# Patient Record
Sex: Female | Born: 1945 | Race: White | Hispanic: No | Smoking: Never smoker
Health system: Southern US, Community
[De-identification: ages and names within clinical notes are randomized; demographics above are authoritative.]

## PROBLEM LIST (undated history)

## (undated) DIAGNOSIS — M1990 Unspecified osteoarthritis, unspecified site: Secondary | ICD-10-CM

## (undated) DIAGNOSIS — C801 Malignant (primary) neoplasm, unspecified: Secondary | ICD-10-CM

## (undated) DIAGNOSIS — T7840XA Allergy, unspecified, initial encounter: Secondary | ICD-10-CM

## (undated) DIAGNOSIS — M359 Systemic involvement of connective tissue, unspecified: Secondary | ICD-10-CM

## (undated) DIAGNOSIS — H269 Unspecified cataract: Secondary | ICD-10-CM

## (undated) DIAGNOSIS — K828 Other specified diseases of gallbladder: Secondary | ICD-10-CM

## (undated) DIAGNOSIS — J45909 Unspecified asthma, uncomplicated: Secondary | ICD-10-CM

## (undated) DIAGNOSIS — Z5189 Encounter for other specified aftercare: Secondary | ICD-10-CM

## (undated) HISTORY — PX: DILATION AND CURETTAGE OF UTERUS: SHX78

## (undated) HISTORY — PX: ABDOMINAL HYSTERECTOMY: SHX81

## (undated) HISTORY — DX: Unspecified cataract: H26.9

## (undated) HISTORY — DX: Encounter for other specified aftercare: Z51.89

## (undated) HISTORY — DX: Malignant (primary) neoplasm, unspecified: C80.1

## (undated) HISTORY — DX: Allergy, unspecified, initial encounter: T78.40XA

## (undated) HISTORY — PX: TONSILLECTOMY: SUR1361

## (undated) HISTORY — PX: EYE SURGERY: SHX253

## (undated) HISTORY — DX: Unspecified osteoarthritis, unspecified site: M19.90

## (undated) HISTORY — DX: Unspecified asthma, uncomplicated: J45.909

---

## 1998-02-25 ENCOUNTER — Other Ambulatory Visit: Admission: RE | Admit: 1998-02-25 | Discharge: 1998-02-25 | Payer: Self-pay | Admitting: *Deleted

## 1999-04-06 ENCOUNTER — Ambulatory Visit (HOSPITAL_COMMUNITY): Admission: RE | Admit: 1999-04-06 | Discharge: 1999-04-06 | Payer: Self-pay | Admitting: Obstetrics and Gynecology

## 1999-04-06 ENCOUNTER — Encounter: Payer: Self-pay | Admitting: Obstetrics and Gynecology

## 2000-05-03 ENCOUNTER — Ambulatory Visit (HOSPITAL_COMMUNITY): Admission: RE | Admit: 2000-05-03 | Discharge: 2000-05-03 | Payer: Self-pay | Admitting: Obstetrics and Gynecology

## 2000-05-03 ENCOUNTER — Encounter: Payer: Self-pay | Admitting: Gynecology

## 2001-05-09 ENCOUNTER — Encounter: Payer: Self-pay | Admitting: Obstetrics and Gynecology

## 2001-05-09 ENCOUNTER — Ambulatory Visit (HOSPITAL_COMMUNITY): Admission: RE | Admit: 2001-05-09 | Discharge: 2001-05-09 | Payer: Self-pay | Admitting: Obstetrics and Gynecology

## 2002-05-12 ENCOUNTER — Encounter: Payer: Self-pay | Admitting: Gynecology

## 2002-05-12 ENCOUNTER — Ambulatory Visit (HOSPITAL_COMMUNITY): Admission: RE | Admit: 2002-05-12 | Discharge: 2002-05-12 | Payer: Self-pay | Admitting: Gynecology

## 2003-05-18 ENCOUNTER — Ambulatory Visit (HOSPITAL_COMMUNITY): Admission: RE | Admit: 2003-05-18 | Discharge: 2003-05-18 | Payer: Self-pay | Admitting: Obstetrics and Gynecology

## 2004-06-23 ENCOUNTER — Ambulatory Visit (HOSPITAL_COMMUNITY): Admission: RE | Admit: 2004-06-23 | Discharge: 2004-06-23 | Payer: Self-pay | Admitting: Obstetrics and Gynecology

## 2007-02-16 ENCOUNTER — Ambulatory Visit: Payer: Self-pay | Admitting: Cardiology

## 2007-02-25 ENCOUNTER — Ambulatory Visit: Payer: Self-pay | Admitting: Cardiology

## 2007-03-04 ENCOUNTER — Ambulatory Visit: Payer: Self-pay | Admitting: Cardiology

## 2009-11-01 ENCOUNTER — Encounter: Admission: RE | Admit: 2009-11-01 | Discharge: 2009-11-01 | Payer: Self-pay | Admitting: Neurosurgery

## 2010-06-10 ENCOUNTER — Encounter: Payer: Self-pay | Admitting: Obstetrics and Gynecology

## 2010-10-03 NOTE — Assessment & Plan Note (Signed)
[facility]                          [facility] OFFICE NOTE   NAME:Virginia Nash, Virginia Nash                     MRN:          [id number]  DATE:03/04/2007                            DOB:          1946/04/24    HISTORY OF PRESENT ILLNESS:  Patient is a 65 year old female with few  cardiac risk factors, who is admitted with atypical chest pain on  February 17, 2007.  The patient ruled out for myocardial infarction.  She underwent a Cardiolite stress study, which was within normal limits.  More importantly, the patient had transient left face and arm  paresthesias.  CT scan was negative for bleed.  She also had some  dysphagia, but she had a barium swallow in the past.   Patient states that since the hospital discharge, she has done well and  has had no recurrent symptoms.  She also had an echocardiographic study  done with saline contrast to rule out patent foramen ovale.  The study  was negative.   Carotid Dopplers were done.  There was no significant carotid disease  noted.  However, Dr. Vergia Alcon did mention the fact that there was less than  50% plaque bilaterally.  Whether this represents any degree of plaque is  not certain to me.  The patient since hospital discharge has been taking  aspirin.   She denies any chest pain, shortness of breath, orthopnea, PND,  palpitations, or syncope.   MEDICATIONS:  1. Calcium 600 mg p.o. b.i.d.  2. Aspirin 81 mg p.o. daily.   PHYSICAL EXAMINATION:  VITAL SIGNS:  Blood pressure is 127/77, heart  rate 67 beats per minute, weight is 186 pounds.  NECK:  Normal carotid upstrokes.  No carotid bruits.  LUNGS:  Clear breath sounds bilaterally.  HEART:  Regular rate and rhythm.  Normal S1 and S2.  No murmurs, gallops  or rubs.  ABDOMEN:  Soft, nontender.  No rebound or guarding.  Good bowel sounds.  EXTREMITIES:  No clubbing, cyanosis or edema.  NEURO:  Patient is alert and oriented, grossly nonfocal.   PROBLEMS:  1. Atypical chest pain.  Negative Cardiolite imaging study.  2. Transient left face/arm paresthesias.      a.     Negative 2D echo for patent foramen ovale.  3. Unremarkable carotid Dopplers.  4. Rule out intracranial event.  Negative CT scan.  Negative      dysphagia.  Status post evaluation of barium swallow.   PLAN:  1. The patient's left arm and facial numbness is still concerning.      She has had no recurrence of disease.  She has been placed on      aspirin.  I have asked the patient to go ahead and make an      appointment with Dr. Benson Setting.  We will also order an MRI in the      interim.  Further evaluation can be per the neurologist.  2. In regards to the patient's chest pain, this has resolved.      Cardiolite study was negative.  She also has essentially no cardiac  risk factors.  I do not think any further ischemia workup at this      point in time is needed.  3. Patient can follow up with [place] in six months.     Learta Codding, MD,FACC  Electronically Signed    GED/MedQ  DD: 03/04/2007  DT: 03/05/2007  Job #: [id number]   cc:   Doreen Beam

## 2011-03-01 DIAGNOSIS — N393 Stress incontinence (female) (male): Secondary | ICD-10-CM | POA: Insufficient documentation

## 2011-03-01 DIAGNOSIS — R32 Unspecified urinary incontinence: Secondary | ICD-10-CM | POA: Insufficient documentation

## 2013-06-30 DIAGNOSIS — J019 Acute sinusitis, unspecified: Secondary | ICD-10-CM | POA: Diagnosis not present

## 2013-10-20 DIAGNOSIS — Z1212 Encounter for screening for malignant neoplasm of rectum: Secondary | ICD-10-CM | POA: Diagnosis not present

## 2013-10-20 DIAGNOSIS — R5381 Other malaise: Secondary | ICD-10-CM | POA: Diagnosis not present

## 2013-10-20 DIAGNOSIS — Z0000 Encounter for general adult medical examination without abnormal findings: Secondary | ICD-10-CM | POA: Diagnosis not present

## 2013-10-20 DIAGNOSIS — E7800 Pure hypercholesterolemia, unspecified: Secondary | ICD-10-CM | POA: Diagnosis not present

## 2013-10-21 DIAGNOSIS — Z23 Encounter for immunization: Secondary | ICD-10-CM | POA: Diagnosis not present

## 2013-11-21 DIAGNOSIS — Z79899 Other long term (current) drug therapy: Secondary | ICD-10-CM | POA: Diagnosis not present

## 2013-11-21 DIAGNOSIS — S6990XA Unspecified injury of unspecified wrist, hand and finger(s), initial encounter: Secondary | ICD-10-CM | POA: Diagnosis not present

## 2013-11-21 DIAGNOSIS — M25549 Pain in joints of unspecified hand: Secondary | ICD-10-CM | POA: Diagnosis not present

## 2013-11-21 DIAGNOSIS — M79609 Pain in unspecified limb: Secondary | ICD-10-CM | POA: Diagnosis not present

## 2013-11-21 DIAGNOSIS — Z8542 Personal history of malignant neoplasm of other parts of uterus: Secondary | ICD-10-CM | POA: Diagnosis not present

## 2013-11-21 DIAGNOSIS — M19049 Primary osteoarthritis, unspecified hand: Secondary | ICD-10-CM | POA: Diagnosis not present

## 2014-01-14 DIAGNOSIS — Z1211 Encounter for screening for malignant neoplasm of colon: Secondary | ICD-10-CM | POA: Diagnosis not present

## 2014-01-19 DIAGNOSIS — Z1231 Encounter for screening mammogram for malignant neoplasm of breast: Secondary | ICD-10-CM | POA: Diagnosis not present

## 2014-02-11 DIAGNOSIS — Z23 Encounter for immunization: Secondary | ICD-10-CM | POA: Diagnosis not present

## 2014-03-23 DIAGNOSIS — C801 Malignant (primary) neoplasm, unspecified: Secondary | ICD-10-CM | POA: Diagnosis not present

## 2014-03-23 DIAGNOSIS — Z01411 Encounter for gynecological examination (general) (routine) with abnormal findings: Secondary | ICD-10-CM | POA: Diagnosis not present

## 2014-04-01 DIAGNOSIS — H2513 Age-related nuclear cataract, bilateral: Secondary | ICD-10-CM | POA: Diagnosis not present

## 2014-04-01 DIAGNOSIS — H2511 Age-related nuclear cataract, right eye: Secondary | ICD-10-CM | POA: Diagnosis not present

## 2014-04-01 DIAGNOSIS — H538 Other visual disturbances: Secondary | ICD-10-CM | POA: Diagnosis not present

## 2014-04-01 DIAGNOSIS — H25041 Posterior subcapsular polar age-related cataract, right eye: Secondary | ICD-10-CM | POA: Diagnosis not present

## 2014-04-01 DIAGNOSIS — H25042 Posterior subcapsular polar age-related cataract, left eye: Secondary | ICD-10-CM | POA: Diagnosis not present

## 2014-04-21 DIAGNOSIS — M542 Cervicalgia: Secondary | ICD-10-CM | POA: Diagnosis not present

## 2014-04-21 DIAGNOSIS — M2550 Pain in unspecified joint: Secondary | ICD-10-CM | POA: Diagnosis not present

## 2014-04-29 DIAGNOSIS — H2511 Age-related nuclear cataract, right eye: Secondary | ICD-10-CM | POA: Diagnosis not present

## 2014-04-29 DIAGNOSIS — H25041 Posterior subcapsular polar age-related cataract, right eye: Secondary | ICD-10-CM | POA: Diagnosis not present

## 2014-04-29 DIAGNOSIS — H538 Other visual disturbances: Secondary | ICD-10-CM | POA: Diagnosis not present

## 2014-05-03 DIAGNOSIS — Z7982 Long term (current) use of aspirin: Secondary | ICD-10-CM | POA: Diagnosis not present

## 2014-05-03 DIAGNOSIS — H5202 Hypermetropia, left eye: Secondary | ICD-10-CM | POA: Diagnosis not present

## 2014-05-03 DIAGNOSIS — H52209 Unspecified astigmatism, unspecified eye: Secondary | ICD-10-CM | POA: Diagnosis not present

## 2014-05-03 DIAGNOSIS — M2550 Pain in unspecified joint: Secondary | ICD-10-CM | POA: Diagnosis not present

## 2014-05-03 DIAGNOSIS — H538 Other visual disturbances: Secondary | ICD-10-CM | POA: Diagnosis not present

## 2014-05-03 DIAGNOSIS — H2511 Age-related nuclear cataract, right eye: Secondary | ICD-10-CM | POA: Diagnosis not present

## 2014-05-03 DIAGNOSIS — H25043 Posterior subcapsular polar age-related cataract, bilateral: Secondary | ICD-10-CM | POA: Diagnosis not present

## 2014-05-03 DIAGNOSIS — R0602 Shortness of breath: Secondary | ICD-10-CM | POA: Diagnosis not present

## 2014-05-03 DIAGNOSIS — R05 Cough: Secondary | ICD-10-CM | POA: Diagnosis not present

## 2014-05-03 DIAGNOSIS — H1132 Conjunctival hemorrhage, left eye: Secondary | ICD-10-CM | POA: Diagnosis not present

## 2014-05-03 DIAGNOSIS — Z79899 Other long term (current) drug therapy: Secondary | ICD-10-CM | POA: Diagnosis not present

## 2014-05-03 DIAGNOSIS — H524 Presbyopia: Secondary | ICD-10-CM | POA: Diagnosis not present

## 2014-05-03 DIAGNOSIS — H269 Unspecified cataract: Secondary | ICD-10-CM | POA: Diagnosis not present

## 2014-05-03 DIAGNOSIS — J45909 Unspecified asthma, uncomplicated: Secondary | ICD-10-CM | POA: Diagnosis not present

## 2014-05-03 DIAGNOSIS — R32 Unspecified urinary incontinence: Secondary | ICD-10-CM | POA: Diagnosis not present

## 2014-05-03 DIAGNOSIS — H25041 Posterior subcapsular polar age-related cataract, right eye: Secondary | ICD-10-CM | POA: Diagnosis not present

## 2014-05-31 DIAGNOSIS — H52209 Unspecified astigmatism, unspecified eye: Secondary | ICD-10-CM | POA: Diagnosis not present

## 2014-05-31 DIAGNOSIS — J45909 Unspecified asthma, uncomplicated: Secondary | ICD-10-CM | POA: Diagnosis not present

## 2014-05-31 DIAGNOSIS — Z91010 Allergy to peanuts: Secondary | ICD-10-CM | POA: Diagnosis not present

## 2014-05-31 DIAGNOSIS — Z91018 Allergy to other foods: Secondary | ICD-10-CM | POA: Diagnosis not present

## 2014-05-31 DIAGNOSIS — H538 Other visual disturbances: Secondary | ICD-10-CM | POA: Diagnosis not present

## 2014-05-31 DIAGNOSIS — H2512 Age-related nuclear cataract, left eye: Secondary | ICD-10-CM | POA: Diagnosis not present

## 2014-05-31 DIAGNOSIS — Z961 Presence of intraocular lens: Secondary | ICD-10-CM | POA: Diagnosis not present

## 2014-05-31 DIAGNOSIS — H25042 Posterior subcapsular polar age-related cataract, left eye: Secondary | ICD-10-CM | POA: Diagnosis not present

## 2014-05-31 DIAGNOSIS — H524 Presbyopia: Secondary | ICD-10-CM | POA: Diagnosis not present

## 2014-05-31 DIAGNOSIS — H5200 Hypermetropia, unspecified eye: Secondary | ICD-10-CM | POA: Diagnosis not present

## 2014-05-31 DIAGNOSIS — Z7982 Long term (current) use of aspirin: Secondary | ICD-10-CM | POA: Diagnosis not present

## 2014-05-31 DIAGNOSIS — Z79899 Other long term (current) drug therapy: Secondary | ICD-10-CM | POA: Diagnosis not present

## 2014-05-31 DIAGNOSIS — Z8673 Personal history of transient ischemic attack (TIA), and cerebral infarction without residual deficits: Secondary | ICD-10-CM | POA: Diagnosis not present

## 2014-05-31 DIAGNOSIS — H1132 Conjunctival hemorrhage, left eye: Secondary | ICD-10-CM | POA: Diagnosis not present

## 2014-05-31 DIAGNOSIS — H269 Unspecified cataract: Secondary | ICD-10-CM | POA: Diagnosis not present

## 2014-07-21 DIAGNOSIS — M79602 Pain in left arm: Secondary | ICD-10-CM | POA: Diagnosis not present

## 2014-07-21 DIAGNOSIS — R5383 Other fatigue: Secondary | ICD-10-CM | POA: Diagnosis not present

## 2014-08-05 DIAGNOSIS — R5383 Other fatigue: Secondary | ICD-10-CM | POA: Diagnosis not present

## 2014-10-27 DIAGNOSIS — M25512 Pain in left shoulder: Secondary | ICD-10-CM | POA: Diagnosis not present

## 2014-10-27 DIAGNOSIS — M19012 Primary osteoarthritis, left shoulder: Secondary | ICD-10-CM | POA: Diagnosis not present

## 2014-10-27 DIAGNOSIS — Z0000 Encounter for general adult medical examination without abnormal findings: Secondary | ICD-10-CM | POA: Diagnosis not present

## 2014-12-27 DIAGNOSIS — M1711 Unilateral primary osteoarthritis, right knee: Secondary | ICD-10-CM | POA: Diagnosis not present

## 2014-12-27 DIAGNOSIS — M79604 Pain in right leg: Secondary | ICD-10-CM | POA: Diagnosis not present

## 2014-12-27 DIAGNOSIS — M1712 Unilateral primary osteoarthritis, left knee: Secondary | ICD-10-CM | POA: Diagnosis not present

## 2014-12-30 DIAGNOSIS — N1330 Unspecified hydronephrosis: Secondary | ICD-10-CM | POA: Diagnosis not present

## 2014-12-30 DIAGNOSIS — R079 Chest pain, unspecified: Secondary | ICD-10-CM | POA: Diagnosis not present

## 2014-12-30 DIAGNOSIS — K829 Disease of gallbladder, unspecified: Secondary | ICD-10-CM | POA: Diagnosis not present

## 2014-12-30 DIAGNOSIS — Z91018 Allergy to other foods: Secondary | ICD-10-CM | POA: Diagnosis not present

## 2014-12-30 DIAGNOSIS — J984 Other disorders of lung: Secondary | ICD-10-CM | POA: Diagnosis not present

## 2014-12-30 DIAGNOSIS — R0789 Other chest pain: Secondary | ICD-10-CM | POA: Diagnosis not present

## 2014-12-31 DIAGNOSIS — R52 Pain, unspecified: Secondary | ICD-10-CM | POA: Diagnosis not present

## 2014-12-31 DIAGNOSIS — R3915 Urgency of urination: Secondary | ICD-10-CM | POA: Diagnosis not present

## 2014-12-31 DIAGNOSIS — R079 Chest pain, unspecified: Secondary | ICD-10-CM | POA: Diagnosis not present

## 2014-12-31 DIAGNOSIS — K828 Other specified diseases of gallbladder: Secondary | ICD-10-CM | POA: Diagnosis not present

## 2014-12-31 DIAGNOSIS — N1330 Unspecified hydronephrosis: Secondary | ICD-10-CM | POA: Diagnosis not present

## 2015-01-03 DIAGNOSIS — K828 Other specified diseases of gallbladder: Secondary | ICD-10-CM | POA: Diagnosis not present

## 2015-01-03 DIAGNOSIS — R319 Hematuria, unspecified: Secondary | ICD-10-CM | POA: Diagnosis not present

## 2015-01-10 DIAGNOSIS — M25561 Pain in right knee: Secondary | ICD-10-CM | POA: Diagnosis not present

## 2015-01-10 DIAGNOSIS — R934 Abnormal findings on diagnostic imaging of urinary organs: Secondary | ICD-10-CM | POA: Diagnosis not present

## 2015-01-10 DIAGNOSIS — R3915 Urgency of urination: Secondary | ICD-10-CM | POA: Diagnosis not present

## 2015-01-10 DIAGNOSIS — N393 Stress incontinence (female) (male): Secondary | ICD-10-CM | POA: Diagnosis not present

## 2015-01-10 DIAGNOSIS — R3914 Feeling of incomplete bladder emptying: Secondary | ICD-10-CM | POA: Diagnosis not present

## 2015-01-17 ENCOUNTER — Ambulatory Visit: Payer: Medicare Other | Admitting: Orthopedic Surgery

## 2015-01-18 DIAGNOSIS — N2889 Other specified disorders of kidney and ureter: Secondary | ICD-10-CM | POA: Diagnosis not present

## 2015-01-18 DIAGNOSIS — R934 Abnormal findings on diagnostic imaging of urinary organs: Secondary | ICD-10-CM | POA: Diagnosis not present

## 2015-01-26 DIAGNOSIS — Z961 Presence of intraocular lens: Secondary | ICD-10-CM | POA: Diagnosis not present

## 2015-01-31 DIAGNOSIS — R3915 Urgency of urination: Secondary | ICD-10-CM | POA: Diagnosis not present

## 2015-01-31 DIAGNOSIS — R934 Abnormal findings on diagnostic imaging of urinary organs: Secondary | ICD-10-CM | POA: Diagnosis not present

## 2015-01-31 DIAGNOSIS — R3914 Feeling of incomplete bladder emptying: Secondary | ICD-10-CM | POA: Diagnosis not present

## 2015-02-15 DIAGNOSIS — Z23 Encounter for immunization: Secondary | ICD-10-CM | POA: Diagnosis not present

## 2015-02-21 DIAGNOSIS — M1711 Unilateral primary osteoarthritis, right knee: Secondary | ICD-10-CM | POA: Diagnosis not present

## 2015-02-24 ENCOUNTER — Ambulatory Visit (INDEPENDENT_AMBULATORY_CARE_PROVIDER_SITE_OTHER): Payer: Medicare Other | Admitting: Pediatrics

## 2015-02-24 ENCOUNTER — Encounter: Payer: Self-pay | Admitting: Pediatrics

## 2015-02-24 VITALS — BP 127/79 | HR 73 | Temp 97.3°F | Ht 61.0 in | Wt 195.6 lb

## 2015-02-24 DIAGNOSIS — R1011 Right upper quadrant pain: Secondary | ICD-10-CM | POA: Diagnosis not present

## 2015-02-24 DIAGNOSIS — Z8249 Family history of ischemic heart disease and other diseases of the circulatory system: Secondary | ICD-10-CM | POA: Diagnosis not present

## 2015-02-24 DIAGNOSIS — R932 Abnormal findings on diagnostic imaging of liver and biliary tract: Secondary | ICD-10-CM

## 2015-02-24 DIAGNOSIS — Z13220 Encounter for screening for lipoid disorders: Secondary | ICD-10-CM | POA: Diagnosis not present

## 2015-02-24 DIAGNOSIS — Z6836 Body mass index (BMI) 36.0-36.9, adult: Secondary | ICD-10-CM

## 2015-02-24 DIAGNOSIS — R079 Chest pain, unspecified: Secondary | ICD-10-CM | POA: Diagnosis not present

## 2015-02-24 DIAGNOSIS — E669 Obesity, unspecified: Secondary | ICD-10-CM | POA: Insufficient documentation

## 2015-02-24 DIAGNOSIS — M179 Osteoarthritis of knee, unspecified: Secondary | ICD-10-CM | POA: Diagnosis not present

## 2015-02-24 DIAGNOSIS — M1990 Unspecified osteoarthritis, unspecified site: Secondary | ICD-10-CM | POA: Insufficient documentation

## 2015-02-24 DIAGNOSIS — M1711 Unilateral primary osteoarthritis, right knee: Secondary | ICD-10-CM

## 2015-02-24 NOTE — Progress Notes (Signed)
Subjective:    Patient ID: Virginia Nash, female    DOB: 06/04/1945, 69 y.o.   MRN: [id number]  CC: wants to establish care  HPI: Virginia Nash is a 69 y.o. female presenting on 02/24/2015 for follow up multiple med problems. Gallbladder problems a month ago in the hospital.  Gallbladder was not removed, now when she eats often will have loose stools, was told she has 2% gall bladder function. Has lost a couple lbs in the last  No history of heart problems. Has had a couple of chest pain work ups. Walking around house, walking around shopping, no SOB. Arthritis, pain in her R knee.  Urinary incontinence, on myrbetriq, sees urology. Takes aspirin for primary prevention.  Relevant past medical, surgical, family and social history reviewed. Medical history reviewed. Allergies and medications reviewed.  ROS: All systems neg other than what is in HPI  Past Medical History There are no active problems to display for this patient.   Current Outpatient Prescriptions  Medication Sig Dispense Refill  . aspirin 81 MG tablet Take 81 mg by mouth daily.    . mirabegron ER (MYRBETRIQ) 25 MG TB24 tablet Take 25 mg by mouth daily.     No current facility-administered medications for this visit.       Objective:    BP 127/79 mmHg  Pulse 73  Temp(Src) 97.3 F (36.3 C) (Oral)  Ht 5' 1" (1.549 m)  Wt 195 lb 9.6 oz (88.724 kg)  BMI 36.98 kg/m2  Wt Readings from Last 3 Encounters:  02/24/15 195 lb 9.6 oz (88.724 kg)     Gen: NAD, alert, cooperative with exam, NCAT EYES: EOMI, no scleral injection or icterus ENT:  TMs pearly gray b/l, OP without erythema LYMPH: no cervical LAD Neck: normal thyroid CV: NRRR, normal S1/S2, no murmur Resp: CTABL, no wheezes, normal WOB Abd: +BS, soft, NTND. no guarding or organomegaly, no RUQ pain. Ext: No edema, warm Neuro: Alert and oriented, strength equal b/l UE and LE, coordination grossly normal     Assessment & Plan:     Nancyjo was  seen today to establish care and for follow up of multiple med problems.  Diagnoses and all orders for this visit:  Right upper quadrant pain, abnormal gall bladder imaging improved since ED visit but continues to have some dumping. Pt says she does not have gall stones but does have poor function of her glal bladder. She has follow up with her GI doctor later this month. She is avoiding foods that make the dumping worse. Rec avoiding fatty foods. -     CMP14+EGFR  Screening for hyperlipidemia, she has had chest pain in the past, was told it was not her heart. Will send cholesterol panel. No recent chest pains. -     Lipid panel  BMI 36 Discussed walking daily as she is able to, movement in R knee is limited by OA pain. Discussed healthy choices.  OA takes naproxen for R knee pain, helps.   Follow up plan: Return in about 5 months (around 07/25/2015).  Carol Vincent, MD [facility] 02/24/2015, 9:37 AM   

## 2015-02-25 LAB — CMP14+EGFR
A/G RATIO: 1.5 (ref 1.1–2.5)
ALT: 17 IU/L (ref 0–32)
AST: 16 IU/L (ref 0–40)
Albumin: 4.1 g/dL (ref 3.6–4.8)
Alkaline Phosphatase: 118 IU/L — ABNORMAL HIGH (ref 39–117)
BUN/Creatinine Ratio: 23 (ref 11–26)
BUN: 16 mg/dL (ref 8–27)
Bilirubin Total: 0.6 mg/dL (ref 0.0–1.2)
CALCIUM: 9.2 mg/dL (ref 8.7–10.3)
CO2: 24 mmol/L (ref 18–29)
CREATININE: 0.69 mg/dL (ref 0.57–1.00)
Chloride: 101 mmol/L (ref 97–108)
GFR, EST AFRICAN AMERICAN: 104 mL/min/{1.73_m2} (ref >59)
GFR, EST NON AFRICAN AMERICAN: 90 mL/min/{1.73_m2} (ref >59)
Globulin, Total: 2.7 g/dL (ref 1.5–4.5)
Glucose: 104 mg/dL — ABNORMAL HIGH (ref 65–99)
POTASSIUM: 4 mmol/L (ref 3.5–5.2)
Sodium: 141 mmol/L (ref 134–144)
TOTAL PROTEIN: 6.8 g/dL (ref 6.0–8.5)

## 2015-02-25 LAB — LIPID PANEL
CHOL/HDL RATIO: 4 ratio (ref 0.0–4.4)
Cholesterol, Total: 182 mg/dL (ref 100–199)
HDL: 46 mg/dL (ref >39)
LDL Calculated: 109 mg/dL — ABNORMAL HIGH (ref 0–99)
TRIGLYCERIDES: 136 mg/dL (ref 0–149)
VLDL CHOLESTEROL CAL: 27 mg/dL (ref 5–40)

## 2015-03-03 DIAGNOSIS — R3915 Urgency of urination: Secondary | ICD-10-CM | POA: Diagnosis not present

## 2015-03-03 DIAGNOSIS — R3914 Feeling of incomplete bladder emptying: Secondary | ICD-10-CM | POA: Diagnosis not present

## 2015-03-04 ENCOUNTER — Telehealth: Payer: Self-pay | Admitting: Pediatrics

## 2015-03-04 NOTE — Telephone Encounter (Signed)
Stp and advised we don't have access to any results or imaging from [place], pt states she will try to get the reports and get them to [place].

## 2015-03-16 ENCOUNTER — Telehealth: Payer: Self-pay | Admitting: Pediatrics

## 2015-03-16 DIAGNOSIS — K828 Other specified diseases of gallbladder: Secondary | ICD-10-CM

## 2015-03-16 DIAGNOSIS — N1339 Other hydronephrosis: Secondary | ICD-10-CM

## 2015-03-16 NOTE — Telephone Encounter (Signed)
Message routed to Dr. Evette Doffing to review records and referral regarding gallbladder

## 2015-03-17 NOTE — Telephone Encounter (Signed)
Pt admitted to [facility] 12/2014 for chest pain rule out, found to have non-functioning gallbladder, now with intermittent RUQ abd pain and R sided hydronephrosis with high-attentuation material in R renal collecting system as well as urinary bladder thought to be blood. Will refer to urology and surgery.

## 2015-03-17 NOTE — Addendum Note (Signed)
Addended by: Eustaquio Maize on: 03/17/2015 02:06 PM   Modules accepted: Orders

## 2015-03-18 ENCOUNTER — Encounter: Payer: Self-pay | Admitting: *Deleted

## 2015-03-21 ENCOUNTER — Telehealth: Payer: Self-pay | Admitting: Pediatrics

## 2015-03-21 NOTE — Telephone Encounter (Signed)
Pt notified paperwork and disc is at the front desk for pick up Verbalizes understanding

## 2015-03-29 ENCOUNTER — Ambulatory Visit: Payer: Self-pay | Admitting: General Surgery

## 2015-03-29 DIAGNOSIS — K828 Other specified diseases of gallbladder: Secondary | ICD-10-CM | POA: Diagnosis not present

## 2015-03-31 ENCOUNTER — Telehealth: Payer: Self-pay | Admitting: Pediatrics

## 2015-04-05 ENCOUNTER — Encounter (HOSPITAL_BASED_OUTPATIENT_CLINIC_OR_DEPARTMENT_OTHER): Payer: Self-pay | Admitting: *Deleted

## 2015-04-07 ENCOUNTER — Other Ambulatory Visit: Payer: Self-pay

## 2015-04-07 ENCOUNTER — Encounter (HOSPITAL_COMMUNITY)
Admission: RE | Admit: 2015-04-07 | Discharge: 2015-04-07 | Disposition: A | Discharge status: Home / Self Care | Payer: Medicare Other | Source: Ambulatory Visit | Attending: General Surgery | Admitting: General Surgery

## 2015-04-07 ENCOUNTER — Ambulatory Visit (HOSPITAL_COMMUNITY)
Admission: RE | Admit: 2015-04-07 | Discharge: 2015-04-07 | Disposition: A | Discharge status: Home / Self Care | Payer: Medicare Other | Source: Ambulatory Visit | Attending: General Surgery | Admitting: General Surgery

## 2015-04-07 DIAGNOSIS — Z01818 Encounter for other preprocedural examination: Secondary | ICD-10-CM | POA: Insufficient documentation

## 2015-04-07 DIAGNOSIS — Z01812 Encounter for preprocedural laboratory examination: Secondary | ICD-10-CM | POA: Insufficient documentation

## 2015-04-07 DIAGNOSIS — Z01810 Encounter for preprocedural cardiovascular examination: Secondary | ICD-10-CM | POA: Diagnosis not present

## 2015-04-07 LAB — CBC WITH DIFFERENTIAL/PLATELET
Basophils Absolute: 0 10*3/uL (ref 0.0–0.1)
Basophils Relative: 1 %
EOS ABS: 0.2 10*3/uL (ref 0.0–0.7)
EOS PCT: 4 %
HCT: 42.4 % (ref 36.0–46.0)
Hemoglobin: 13.9 g/dL (ref 12.0–15.0)
LYMPHS ABS: 2.2 10*3/uL (ref 0.7–4.0)
Lymphocytes Relative: 34 %
MCH: 31.4 pg (ref 26.0–34.0)
MCHC: 32.8 g/dL (ref 30.0–36.0)
MCV: 95.7 fL (ref 78.0–100.0)
MONOS PCT: 7 %
Monocytes Absolute: 0.5 10*3/uL (ref 0.1–1.0)
Neutro Abs: 3.5 10*3/uL (ref 1.7–7.7)
Neutrophils Relative %: 54 %
PLATELETS: 199 10*3/uL (ref 150–400)
RBC: 4.43 MIL/uL (ref 3.87–5.11)
RDW: 13.5 % (ref 11.5–15.5)
WBC: 6.5 10*3/uL (ref 4.0–10.5)

## 2015-04-07 LAB — COMPREHENSIVE METABOLIC PANEL
ALT: 16 U/L (ref 14–54)
ANION GAP: 5 (ref 5–15)
AST: 19 U/L (ref 15–41)
Albumin: 4 g/dL (ref 3.5–5.0)
Alkaline Phosphatase: 107 U/L (ref 38–126)
BUN: 18 mg/dL (ref 6–20)
CHLORIDE: 106 mmol/L (ref 101–111)
CO2: 27 mmol/L (ref 22–32)
Calcium: 8.9 mg/dL (ref 8.9–10.3)
Creatinine, Ser: 0.76 mg/dL (ref 0.44–1.00)
GFR calc non Af Amer: >60 mL/min (ref >60)
Glucose, Bld: 108 mg/dL — ABNORMAL HIGH (ref 65–99)
POTASSIUM: 4.7 mmol/L (ref 3.5–5.1)
SODIUM: 138 mmol/L (ref 135–145)
Total Bilirubin: 0.7 mg/dL (ref 0.3–1.2)
Total Protein: 7 g/dL (ref 6.5–8.1)

## 2015-04-08 DIAGNOSIS — Z1231 Encounter for screening mammogram for malignant neoplasm of breast: Secondary | ICD-10-CM | POA: Diagnosis not present

## 2015-04-08 LAB — HM MAMMOGRAPHY: HM MAMMO: NEGATIVE

## 2015-04-12 ENCOUNTER — Ambulatory Visit (HOSPITAL_BASED_OUTPATIENT_CLINIC_OR_DEPARTMENT_OTHER)
Admission: RE | Admit: 2015-04-12 | Discharge: 2015-04-12 | Disposition: A | Discharge status: Home / Self Care | Payer: Medicare Other | Source: Ambulatory Visit | Attending: General Surgery | Admitting: General Surgery

## 2015-04-12 ENCOUNTER — Ambulatory Visit (HOSPITAL_BASED_OUTPATIENT_CLINIC_OR_DEPARTMENT_OTHER): Payer: Medicare Other | Admitting: Anesthesiology

## 2015-04-12 ENCOUNTER — Encounter (HOSPITAL_BASED_OUTPATIENT_CLINIC_OR_DEPARTMENT_OTHER): Payer: Self-pay | Admitting: *Deleted

## 2015-04-12 ENCOUNTER — Encounter (HOSPITAL_BASED_OUTPATIENT_CLINIC_OR_DEPARTMENT_OTHER)
Admission: RE | Disposition: A | Discharge status: Home / Self Care | Payer: Self-pay | Source: Ambulatory Visit | Attending: General Surgery

## 2015-04-12 DIAGNOSIS — K811 Chronic cholecystitis: Secondary | ICD-10-CM | POA: Diagnosis not present

## 2015-04-12 DIAGNOSIS — Z6836 Body mass index (BMI) 36.0-36.9, adult: Secondary | ICD-10-CM | POA: Insufficient documentation

## 2015-04-12 DIAGNOSIS — K828 Other specified diseases of gallbladder: Secondary | ICD-10-CM | POA: Insufficient documentation

## 2015-04-12 DIAGNOSIS — M1990 Unspecified osteoarthritis, unspecified site: Secondary | ICD-10-CM | POA: Diagnosis not present

## 2015-04-12 DIAGNOSIS — Z7982 Long term (current) use of aspirin: Secondary | ICD-10-CM | POA: Diagnosis not present

## 2015-04-12 DIAGNOSIS — Z791 Long term (current) use of non-steroidal anti-inflammatories (NSAID): Secondary | ICD-10-CM | POA: Diagnosis not present

## 2015-04-12 HISTORY — DX: Other specified diseases of gallbladder: K82.8

## 2015-04-12 HISTORY — PX: CHOLECYSTECTOMY: SHX55

## 2015-04-12 SURGERY — LAPAROSCOPIC CHOLECYSTECTOMY
Anesthesia: General | Site: Abdomen

## 2015-04-12 MED ORDER — EPHEDRINE SULFATE 50 MG/ML IJ SOLN
INTRAMUSCULAR | Status: DC | PRN
  Administered 2015-04-12: 15 mg via INTRAVENOUS

## 2015-04-12 MED ORDER — FENTANYL CITRATE (PF) 100 MCG/2ML IJ SOLN
INTRAMUSCULAR | Status: AC
  Filled 2015-04-12: qty 2

## 2015-04-12 MED ORDER — NEOSTIGMINE METHYLSULFATE 10 MG/10ML IV SOLN
INTRAVENOUS | Status: DC | PRN
  Administered 2015-04-12: 4 mg via INTRAVENOUS

## 2015-04-12 MED ORDER — SCOPOLAMINE 1 MG/3DAYS TD PT72: 1.0000 | MEDICATED_PATCH | Freq: Once | TRANSDERMAL | Status: DC | PRN

## 2015-04-12 MED ORDER — MEPERIDINE HCL 25 MG/ML IJ SOLN: 6.2500 mg | INTRAMUSCULAR | Status: DC | PRN

## 2015-04-12 MED ORDER — NEOSTIGMINE METHYLSULFATE 10 MG/10ML IV SOLN
INTRAVENOUS | Status: AC
  Filled 2015-04-12: qty 1

## 2015-04-12 MED ORDER — DEXAMETHASONE SODIUM PHOSPHATE 4 MG/ML IJ SOLN
INTRAMUSCULAR | Status: DC | PRN
  Administered 2015-04-12: 10 mg via INTRAVENOUS

## 2015-04-12 MED ORDER — OXYCODONE HCL 5 MG/5ML PO SOLN: 5.0000 mg | Freq: Once | ORAL | Status: DC | PRN

## 2015-04-12 MED ORDER — BUPIVACAINE-EPINEPHRINE (PF) 0.5% -1:200000 IJ SOLN
INTRAMUSCULAR | Status: AC
  Filled 2015-04-12: qty 30

## 2015-04-12 MED ORDER — CEFAZOLIN SODIUM-DEXTROSE 2-3 GM-% IV SOLR
2.0000 g | INTRAVENOUS | Status: AC
  Administered 2015-04-12: 2 g via INTRAVENOUS

## 2015-04-12 MED ORDER — BUPIVACAINE-EPINEPHRINE (PF) 0.25% -1:200000 IJ SOLN
INTRAMUSCULAR | Status: AC
  Filled 2015-04-12: qty 30

## 2015-04-12 MED ORDER — TRAMADOL HCL 50 MG PO TABS
50.0000 mg | ORAL_TABLET | Freq: Once | ORAL | Status: AC
  Administered 2015-04-12: 50 mg via ORAL

## 2015-04-12 MED ORDER — ONDANSETRON HCL 4 MG/2ML IJ SOLN
INTRAMUSCULAR | Status: AC
Start: 2015-04-12 — End: 2015-04-12
  Filled 2015-04-12: qty 2

## 2015-04-12 MED ORDER — SODIUM CHLORIDE 0.9 % IR SOLN
Status: DC | PRN
  Administered 2015-04-12: 3000 mL

## 2015-04-12 MED ORDER — GLYCOPYRROLATE 0.2 MG/ML IJ SOLN
INTRAMUSCULAR | Status: AC
  Filled 2015-04-12: qty 1

## 2015-04-12 MED ORDER — TRAMADOL HCL 50 MG PO TABS
ORAL_TABLET | ORAL | Status: AC
  Filled 2015-04-12: qty 1

## 2015-04-12 MED ORDER — ARTIFICIAL TEARS OP OINT
TOPICAL_OINTMENT | OPHTHALMIC | Status: AC
  Filled 2015-04-12: qty 3.5

## 2015-04-12 MED ORDER — CHLORHEXIDINE GLUCONATE 4 % EX LIQD: 1.0000 "application " | Freq: Once | CUTANEOUS | Status: DC

## 2015-04-12 MED ORDER — PROPOFOL 10 MG/ML IV BOLUS
INTRAVENOUS | Status: AC
Start: 2015-04-12 — End: 2015-04-12
  Filled 2015-04-12: qty 20

## 2015-04-12 MED ORDER — MIDAZOLAM HCL 2 MG/2ML IJ SOLN
1.0000 mg | INTRAMUSCULAR | Status: DC | PRN
  Administered 2015-04-12: 2 mg via INTRAVENOUS

## 2015-04-12 MED ORDER — BUPIVACAINE-EPINEPHRINE 0.25% -1:200000 IJ SOLN
INTRAMUSCULAR | Status: DC | PRN
  Administered 2015-04-12: 20 mL

## 2015-04-12 MED ORDER — HYDROMORPHONE HCL 1 MG/ML IJ SOLN
INTRAMUSCULAR | Status: AC
  Filled 2015-04-12: qty 1

## 2015-04-12 MED ORDER — OXYCODONE HCL 5 MG PO TABS: 5.0000 mg | ORAL_TABLET | Freq: Once | ORAL | Status: DC | PRN

## 2015-04-12 MED ORDER — KETOROLAC TROMETHAMINE 30 MG/ML IJ SOLN
INTRAMUSCULAR | Status: AC
  Filled 2015-04-12: qty 1

## 2015-04-12 MED ORDER — HYDROCODONE-ACETAMINOPHEN 5-325 MG PO TABS: 1.0000 | ORAL_TABLET | ORAL | Status: DC | PRN

## 2015-04-12 MED ORDER — HYDROMORPHONE HCL 1 MG/ML IJ SOLN
0.2500 mg | INTRAMUSCULAR | Status: DC | PRN
Start: 2015-04-12 — End: 2015-04-12
  Administered 2015-04-12 (×2): 0.5 mg via INTRAVENOUS

## 2015-04-12 MED ORDER — MIDAZOLAM HCL 2 MG/2ML IJ SOLN
INTRAMUSCULAR | Status: AC
  Filled 2015-04-12: qty 2

## 2015-04-12 MED ORDER — ROCURONIUM BROMIDE 50 MG/5ML IV SOLN
INTRAVENOUS | Status: AC
  Filled 2015-04-12: qty 1

## 2015-04-12 MED ORDER — CEFAZOLIN SODIUM-DEXTROSE 2-3 GM-% IV SOLR
INTRAVENOUS | Status: AC
  Filled 2015-04-12: qty 50

## 2015-04-12 MED ORDER — ONDANSETRON HCL 4 MG/2ML IJ SOLN
INTRAMUSCULAR | Status: DC | PRN
  Administered 2015-04-12: 4 mg via INTRAVENOUS

## 2015-04-12 MED ORDER — LIDOCAINE HCL (CARDIAC) 20 MG/ML IV SOLN
INTRAVENOUS | Status: AC
  Filled 2015-04-12: qty 5

## 2015-04-12 MED ORDER — PROPOFOL 10 MG/ML IV BOLUS
INTRAVENOUS | Status: DC | PRN
  Administered 2015-04-12: 200 mg via INTRAVENOUS

## 2015-04-12 MED ORDER — ROCURONIUM BROMIDE 100 MG/10ML IV SOLN
INTRAVENOUS | Status: DC | PRN
  Administered 2015-04-12: 30 mg via INTRAVENOUS

## 2015-04-12 MED ORDER — EPHEDRINE SULFATE 50 MG/ML IJ SOLN
INTRAMUSCULAR | Status: AC
  Filled 2015-04-12: qty 1

## 2015-04-12 MED ORDER — FENTANYL CITRATE (PF) 100 MCG/2ML IJ SOLN
50.0000 ug | INTRAMUSCULAR | Status: DC | PRN
  Administered 2015-04-12: 100 ug via INTRAVENOUS

## 2015-04-12 MED ORDER — KETOROLAC TROMETHAMINE 30 MG/ML IJ SOLN
INTRAMUSCULAR | Status: DC | PRN
  Administered 2015-04-12: 30 mg via INTRAVENOUS

## 2015-04-12 MED ORDER — LIDOCAINE HCL (CARDIAC) 20 MG/ML IV SOLN
INTRAVENOUS | Status: DC | PRN
  Administered 2015-04-12: 75 mg via INTRAVENOUS

## 2015-04-12 MED ORDER — LACTATED RINGERS IV SOLN
INTRAVENOUS | Status: DC
  Administered 2015-04-12: 07:00:00 via INTRAVENOUS

## 2015-04-12 MED ORDER — GLYCOPYRROLATE 0.2 MG/ML IJ SOLN
0.2000 mg | Freq: Once | INTRAMUSCULAR | Status: AC | PRN
  Administered 2015-04-12: 0.6 mg via INTRAVENOUS

## 2015-04-12 MED ORDER — DEXAMETHASONE SODIUM PHOSPHATE 10 MG/ML IJ SOLN
INTRAMUSCULAR | Status: AC
  Filled 2015-04-12: qty 1

## 2015-04-12 SURGICAL SUPPLY — 51 items
ADH SKN CLS APL DERMABOND .7 (GAUZE/BANDAGES/DRESSINGS) ×2
APPLIER CLIP 5 13 M/L LIGAMAX5 (MISCELLANEOUS) ×3
APPLIER CLIP ROT 10 11.4 M/L (STAPLE)
APR CLP MED LRG 11.4X10 (STAPLE)
APR CLP MED LRG 5 ANG JAW (MISCELLANEOUS) ×2
BAG SPEC RTRVL LRG 6X4 10 (ENDOMECHANICALS)
BLADE CLIPPER SURG (BLADE) IMPLANT
CHLORAPREP W/TINT 26ML (MISCELLANEOUS) ×3 IMPLANT
CLEANER CAUTERY TIP 5X5 PAD (MISCELLANEOUS) ×1 IMPLANT
CLIP APPLIE 5 13 M/L LIGAMAX5 (MISCELLANEOUS) ×2 IMPLANT
CLIP APPLIE ROT 10 11.4 M/L (STAPLE) IMPLANT
COVER MAYO STAND STRL (DRAPES) ×3 IMPLANT
DECANTER SPIKE VIAL GLASS SM (MISCELLANEOUS) IMPLANT
DERMABOND ADVANCED (GAUZE/BANDAGES/DRESSINGS) ×1
DERMABOND ADVANCED .7 DNX12 (GAUZE/BANDAGES/DRESSINGS) ×1 IMPLANT
DRAPE C-ARM 42X72 X-RAY (DRAPES) ×1 IMPLANT
DRAPE LAPAROSCOPIC ABDOMINAL (DRAPES) ×3 IMPLANT
DRSG TEGADERM 2-3/8X2-3/4 SM (GAUZE/BANDAGES/DRESSINGS) ×12 IMPLANT
ELECT REM PT RETURN 9FT ADLT (ELECTROSURGICAL) ×3
ELECTRODE REM PT RTRN 9FT ADLT (ELECTROSURGICAL) ×2 IMPLANT
FILTER SMOKE EVAC LAPAROSHD (FILTER) IMPLANT
GLOVE BIOGEL PI IND STRL 7.0 (GLOVE) ×1 IMPLANT
GLOVE BIOGEL PI IND STRL 8 (GLOVE) ×3 IMPLANT
GLOVE BIOGEL PI INDICATOR 7.0 (GLOVE) ×1
GLOVE BIOGEL PI INDICATOR 8 (GLOVE) ×2
GLOVE ECLIPSE 6.5 STRL STRAW (GLOVE) ×3 IMPLANT
GLOVE ECLIPSE 7.5 STRL STRAW (GLOVE) ×3 IMPLANT
GLOVE SURG SS PI 7.5 STRL IVOR (GLOVE) ×2 IMPLANT
GOWN STRL REUS W/ TWL LRG LVL3 (GOWN DISPOSABLE) ×6 IMPLANT
GOWN STRL REUS W/TWL LRG LVL3 (GOWN DISPOSABLE) ×9
HEMOSTAT SNOW SURGICEL 2X4 (HEMOSTASIS) IMPLANT
LIQUID BAND (GAUZE/BANDAGES/DRESSINGS) IMPLANT
NS IRRIG 1000ML POUR BTL (IV SOLUTION) IMPLANT
PACK BASIN DAY SURGERY FS (CUSTOM PROCEDURE TRAY) ×3 IMPLANT
PAD CLEANER CAUTERY TIP 5X5 (MISCELLANEOUS)
POUCH SPECIMEN RETRIEVAL 10MM (ENDOMECHANICALS) ×1 IMPLANT
SCISSORS LAP 5X35 DISP (ENDOMECHANICALS) ×2 IMPLANT
SET CHOLANGIOGRAPH 5 50 .035 (SET/KITS/TRAYS/PACK) IMPLANT
SET IRRIG TUBING LAPAROSCOPIC (IRRIGATION / IRRIGATOR) ×3 IMPLANT
SLEEVE ENDOPATH XCEL 5M (ENDOMECHANICALS) ×6 IMPLANT
SLEEVE SCD COMPRESS KNEE MED (MISCELLANEOUS) ×3 IMPLANT
SPECIMEN JAR SMALL (MISCELLANEOUS) ×3 IMPLANT
STRIP CLOSURE SKIN 1/2X4 (GAUZE/BANDAGES/DRESSINGS) ×2 IMPLANT
SUT MNCRL AB 4-0 PS2 18 (SUTURE) ×3 IMPLANT
TOWEL OR 17X24 6PK STRL BLUE (TOWEL DISPOSABLE) ×3 IMPLANT
TRAY LAPAROSCOPIC (CUSTOM PROCEDURE TRAY) ×3 IMPLANT
TROCAR XCEL BLUNT TIP 100MML (ENDOMECHANICALS) ×3 IMPLANT
TROCAR XCEL NON-BLD 11X100MML (ENDOMECHANICALS) IMPLANT
TROCAR XCEL NON-BLD 5MMX100MML (ENDOMECHANICALS) ×3 IMPLANT
TUBE CONNECTING 20X1/4 (TUBING) IMPLANT
TUBING INSUFFLATION (TUBING) ×3 IMPLANT

## 2015-04-12 NOTE — H&P (Signed)
Virginia Nash is an 69 y.o. female.   Chief Complaint: Abdominal pain HPI: Workup demonstrated low ejection fraction of the gallbladder, and her symptoms are consistent with chronic gallbladder disease  Past Medical History  Diagnosis Date  . Allergy   . Arthritis   . Blood transfusion without reported diagnosis   . Cancer ([place])   . Cataract   . Asthma     as child  . Biliary dyskinesia     Past Surgical History  Procedure Laterality Date  . Abdominal hysterectomy    . Tonsillectomy    . Dilation and curettage of uterus    . Eye surgery      cataract with lens replacement    Family History  Problem Relation Age of Onset  . Arthritis Mother   . Cancer Mother   . Cancer Father   . Hearing loss Sister   . Stroke Maternal Grandmother   . Kidney disease Paternal Grandmother    Social History:  reports that she has never smoked. She does not have any smokeless tobacco history on file. She reports that she does not drink alcohol or use illicit drugs.  Allergies:  Allergies  Allergen Reactions  . Peanuts [Peanut Oil]     Medications Prior to Admission  Medication Sig Dispense Refill  . aspirin 81 MG tablet Take 81 mg by mouth daily.    . naproxen sodium (ANAPROX) 220 MG tablet Take 220 mg by mouth 2 (two) times daily with a meal.      No results found for this or any previous visit (from the past 48 hour(s)). No results found.  Review of Systems  Gastrointestinal: Positive for abdominal pain.  All other systems reviewed and are negative.   Blood pressure 135/64, pulse 60, temperature 97.7 F (36.5 C), temperature source Oral, resp. rate 18, height 5\' 1"  (1.549 m), weight 87.544 kg (193 lb), SpO2 97 %. Physical Exam  Constitutional: She is oriented to person, place, and time. She appears well-developed and well-nourished.  HENT:  Head: Normocephalic and atraumatic.  Eyes: Conjunctivae are normal. Pupils are equal, round, and reactive to light.  Neck: Normal  range of motion. Neck supple.  Cardiovascular: Normal rate and regular rhythm.   Respiratory: Effort normal and breath sounds normal.  GI: Soft. Bowel sounds are normal.  Musculoskeletal: Normal range of motion.  Neurological: She is alert and oriented to person, place, and time. She has normal reflexes.  Skin: Skin is warm and dry.  Psychiatric: She has a normal mood and affect. Her behavior is normal. Judgment and thought content normal.     Assessment/Plan Symptomatic biliary dyskinesia Lap cholecystectomy today.  Lysle Yero 04/12/2015, 7:28 AM

## 2015-04-12 NOTE — Transfer of Care (Signed)
Immediate Anesthesia Transfer of Care Note  Patient: Virginia Nash  Procedure(s) Performed: Procedure(s): LAPAROSCOPIC CHOLECYSTECTOMY (N/A)  Patient Location: PACU  Anesthesia Type:General  Level of Consciousness: awake and patient cooperative  Airway & Oxygen Therapy: Patient Spontanous Breathing and Patient connected to face mask oxygen  Post-op Assessment: Report given to RN and Post -op Vital signs reviewed and stable  Post vital signs: Reviewed and stable  Last Vitals:  Filed Vitals:   04/12/15 0615  BP: 135/64  Pulse: 60  Temp: 36.5 C  Resp: 18    Complications: No apparent anesthesia complications

## 2015-04-12 NOTE — Anesthesia Procedure Notes (Signed)
Procedure Name: Intubation Performed by: Tisheena Maguire, [place] Pre-anesthesia Checklist: Patient identified, Emergency Drugs available, Suction available and Patient being monitored Patient Re-evaluated:Patient Re-evaluated prior to inductionOxygen Delivery Method: Circle System Utilized Preoxygenation: Pre-oxygenation with 100% oxygen Intubation Type: IV induction Ventilation: Mask ventilation without difficulty Laryngoscope Size: Mac and 3 Grade View: Grade II Tube type: Oral Tube size: 7.0 mm Number of attempts: 1 Airway Equipment and Method: Stylet and Oral airway Placement Confirmation: ETT inserted through vocal cords under direct vision,  positive ETCO2 and breath sounds checked- equal and bilateral Tube secured with: Tape Dental Injury: Teeth and Oropharynx as per pre-operative assessment      

## 2015-04-12 NOTE — Anesthesia Preprocedure Evaluation (Signed)
Anesthesia Evaluation  Patient identified by MRN, date of birth, ID band Patient awake    Reviewed: Allergy & Precautions, NPO status , Patient's Chart, lab work & pertinent test results  Airway Mallampati: I  TM Distance: >3 FB Neck ROM: Full    Dental  (+) Teeth Intact, Dental Advisory Given   Pulmonary    breath sounds clear to auscultation       Cardiovascular  Rhythm:Regular Rate:Normal     Neuro/Psych    GI/Hepatic   Endo/Other  Morbid obesity  Renal/GU      Musculoskeletal   Abdominal   Peds  Hematology   Anesthesia Other Findings   Reproductive/Obstetrics                             Anesthesia Physical Anesthesia Plan  ASA: II  Anesthesia Plan: General   Post-op Pain Management:    Induction: Intravenous  Airway Management Planned: Oral ETT  Additional Equipment:   Intra-op Plan:   Post-operative Plan: Extubation in OR  Informed Consent: I have reviewed the patients History and Physical, chart, labs and discussed the procedure including the risks, benefits and alternatives for the proposed anesthesia with the patient or authorized representative who has indicated his/her understanding and acceptance.   Dental advisory given  Plan Discussed with: CRNA, Anesthesiologist and Surgeon  Anesthesia Plan Comments:         Anesthesia Quick Evaluation  

## 2015-04-12 NOTE — Interval H&P Note (Signed)
History and Physical Interval Note:   Office notes:  Virginia Nash 03/29/2015 9:15 AM Location: [facility] Patient #: [id number] DOB: August 10, 1945 Married / Language: Virginia Nash / Race: White Female   History of Present Illness Virginia Nash. Virginia Skains MD; 03/29/2015 9:58 AM) The patient is a 69 year old female.  Note:Epigastric and lwoer chest pain triggering workup and ultimately HIDA scan showing GB EF of 2%  Other Problems Virginia Nash, CMA; 03/29/2015 9:15 AM) Arthritis Bladder Problems Cancer  Past Surgical History Virginia Nash, CMA; 03/29/2015 9:15 AM) Cataract Surgery Bilateral. Hysterectomy (due to cancer) - Complete Oral Surgery Tonsillectomy  Diagnostic Studies History Virginia Nash, CMA; 03/29/2015 9:15 AM) Colonoscopy 5-10 years ago Mammogram within last year Pap Smear 1-5 years ago  Allergies Virginia Nash, CMA; 03/29/2015 9:15 AM) Peanuts  Medication History Virginia Nash, CMA; 03/29/2015 9:16 AM) Aspirin (81MG  Tablet, Oral) Active. Medications Reconciled  Social History Virginia Nash, [place]; 03/29/2015 9:15 AM) Alcohol use Occasional alcohol use. Caffeine use Tea. No drug use Tobacco use Never smoker.  Family History Virginia Nash, [place]; 03/29/2015 9:15 AM) Arthritis Mother. Migraine Headache Daughter. Prostate Cancer Father. Thyroid problems Mother.  Pregnancy / Birth History Virginia Nash, [place]; 03/29/2015 9:15 AM) Age at menarche 36 years. Age of menopause 51-55 Contraceptive History Oral contraceptives. Gravida 3 Irregular periods Maternal age 32-25 Para 3    Review of Systems Virginia Nash CMA; 03/29/2015 9:15 AM) General Not Present- Appetite Loss, Chills, Fatigue, Fever, Night Sweats, Weight Gain and Weight Loss. Skin Not Present- Change in Wart/Mole, Dryness, Hives, Jaundice, New Lesions, Non-Healing Wounds, Rash and Ulcer. HEENT Present- Wears glasses/contact lenses. Not Present- Earache, Hearing Loss, Hoarseness, Nose  Bleed, Oral Ulcers, Ringing in the Ears, Seasonal Allergies, Sinus Pain, Sore Throat, Visual Disturbances and Yellow Eyes. Respiratory Present- Difficulty Breathing. Not Present- Bloody sputum, Chronic Cough, Snoring and Wheezing. Breast Not Present- Breast Mass, Breast Pain, Nipple Discharge and Skin Changes. Cardiovascular Present- Chest Pain, Leg Cramps and Swelling of Extremities. Not Present- Difficulty Breathing Lying Down, Palpitations, Rapid Heart Rate and Shortness of Breath. Gastrointestinal Present- Difficulty Swallowing and Nausea. Not Present- Abdominal Pain, Bloating, Bloody Stool, Change in Bowel Habits, Chronic diarrhea, Constipation, Excessive gas, Gets full quickly at meals, Hemorrhoids, Indigestion, Rectal Pain and Vomiting. Female Genitourinary Present- Frequency and Urgency. Not Present- Nocturia, Painful Urination and Pelvic Pain. Musculoskeletal Present- Joint Pain, Joint Stiffness and Swelling of Extremities. Not Present- Back Pain, Muscle Pain and Muscle Weakness. Neurological Present- Headaches, Tingling and Trouble walking. Not Present- Decreased Memory, Fainting, Numbness, Seizures, Tremor and Weakness. Psychiatric Not Present- Anxiety, Bipolar, Change in Sleep Pattern, Depression, Fearful and Frequent crying. Endocrine Not Present- Cold Intolerance, Excessive Hunger, Hair Changes, Heat Intolerance, Hot flashes and New Diabetes. Hematology Not Present- Easy Bruising, Excessive bleeding, Gland problems, HIV and Persistent Infections.  Vitals Virginia Nash CMA; 03/29/2015 9:16 AM) 03/29/2015 9:16 AM Weight: 194.8 lb Height: 61in Body Surface Area: 1.87 m Body Mass Index: 36.81 kg/m  Temp.: 97.25F(Temporal)  Pulse: 94 (Regular)  BP: 128/76 (Sitting, Left Arm, Standard)       Physical Exam (Virginia Nash O. Virginia Skains MD; 03/29/2015 10:01 AM) General Mental Status-Alert. General Appearance-Well groomed and Consistent with stated age. Orientation-Oriented  X4. Build & Nutrition-Obese. Posture-Normal posture.  Head and Neck Thyroid -Note: Normal without masses. Chest and Lung Exam Chest and lung exam reveals -quiet, even and easy respiratory effort with no use of accessory muscles and on auscultation, normal breath sounds, no adventitious sounds and normal vocal resonance. Cardiovascular Cardiovascular examination  reveals -on palpation PMI is normal in location and amplitude, no palpable S3 or S4. Normal cardiac borders., normal heart sounds, regular rate and rhythm with no murmurs and femoral artery auscultation bilaterally reveals normal pulses, no bruits, no thrills. Abdomen Inspection Inspection of the abdomen reveals - No Visible peristalsis and No Abnormal pulsations. Palpation/Percussion Palpation and Percussion of the abdomen reveal - Soft and Non Tender. Auscultation Auscultation of the abdomen reveals - Bowel sounds normal. Assessment & Plan Virginia Nash O. Demitrus Francisco MD; 03/29/2015 10:02 AM) BILIARY DYSKINESIA (K82.8) Story: Patient admitted at [facility] with chest pain, workup showed biliary dyskinesia. Otherwise the W/u was normal Impression: Symptomatic biliary dyskinesia. Postprandial. Fried foods exacerbate symptoms. Current Plans Pt Education - Gallbladder Diseases: discussed with patient and provided information. 04/12/2015 7:46 AM  Virginia Nash  has presented today for surgery, with the diagnosis of Symptomatic biliary dyskinesia  The various methods of treatment have been discussed with the patient and family. After consideration of risks, benefits and other options for treatment, the patient has consented to  Procedure(s): LAPAROSCOPIC CHOLECYSTECTOMY WITH INTRAOPERATIVE CHOLANGIOGRAM (N/A) as a surgical intervention .  The patient's history has been reviewed, patient examined, no change in status, stable for surgery.  I have reviewed the patient's chart and labs.  Questions were answered to the patient's  satisfaction.     Janei Scheff

## 2015-04-12 NOTE — Anesthesia Postprocedure Evaluation (Signed)
Anesthesia Post Note  Patient: Virginia Nash  Procedure(s) Performed: Procedure(s) (LRB): LAPAROSCOPIC CHOLECYSTECTOMY (N/A)  Patient location during evaluation: PACU Anesthesia Type: General Level of consciousness: awake and alert Pain management: pain level controlled Vital Signs Assessment: post-procedure vital signs reviewed and stable Respiratory status: spontaneous breathing, nonlabored ventilation, respiratory function stable and patient connected to nasal cannula oxygen Cardiovascular status: blood pressure returned to baseline and stable Postop Assessment: No signs of nausea or vomiting Anesthetic complications: no    Last Vitals:  Filed Vitals:   04/12/15 0851 04/12/15 0900  BP:  153/81  Pulse: 89 79  Temp: 36.4 C   Resp: 25 20    Last Pain:  Filed Vitals:   04/12/15 0912  PainSc: 4                  [name] A

## 2015-04-12 NOTE — Discharge Instructions (Addendum)
Laparoscopic Cholecystectomy, Care After Refer to this sheet in the next few weeks. These instructions provide you with information about caring for yourself after your procedure. Your health care provider may also give you more specific instructions. Your treatment has been planned according to current medical practices, but problems sometimes occur. Call your health care provider if you have any problems or questions after your procedure. WHAT TO EXPECT AFTER THE PROCEDURE After your procedure, it is common to have:  Pain at your incision sites. You will be given pain medicines to control your pain.  Mild nausea or vomiting. This should improve after the first 24 hours.  Bloating and possible shoulder pain from the gas that was used during the procedure. This will improve after the first 24 hours. HOME CARE INSTRUCTIONS Incision Care  Follow instructions from your health care provider about how to take care of your incisions. Make sure you:  Wash your hands with soap and water before you change your bandage (dressing). If soap and water are not available, use hand sanitizer.  Change your dressing as told by your health care provider.  Leave stitches (sutures), skin glue, or adhesive strips in place. These skin closures may need to be in place for 2 weeks or longer. If adhesive strip edges start to loosen and curl up, you may trim the loose edges. Do not remove adhesive strips completely unless your health care provider tells you to do that.  Do not take baths, swim, or use a hot tub until your health care provider approves. Ask your health care provider if you can take showers. You may only be allowed to take sponge baths for bathing. General Instructions  Take over-the-counter and prescription medicines only as told by your health care provider.  Do not drive or operate heavy machinery while taking prescription pain medicine.  Return to your normal diet as told by your health care  provider.  Do not lift anything that is heavier than 10 lb (4.5 kg).  Do not play contact sports for one week or until your health care provider approves. SEEK MEDICAL CARE IF:   You have redness, swelling, or pain at the site of your incision.  You have fluid, blood, or pus coming from your incision.  You notice a bad smell coming from your incision area.  Your surgical incisions break open.  You have a fever. SEEK IMMEDIATE MEDICAL CARE IF:  You develop a rash.  You have difficulty breathing.  You have chest pain.  You have increasing pain in your shoulders (shoulder strap areas).  You faint or have dizzy episodes while you are standing.  You have severe pain in your abdomen.  You have nausea or vomiting that lasts for more than one day.   This information is not intended to replace advice given to you by your health care provider. Make sure you discuss any questions you have with your health care provider.   Document Released: 05/07/2005 Document Revised: 01/26/2015 Document Reviewed: 12/17/2012 Elsevier Interactive Patient Education 2016 [place] Anesthesia Home Care Instructions  Activity: Get plenty of rest for the remainder of the day. A responsible adult should stay with you for 24 hours following the procedure.  For the next 24 hours, DO NOT: -Drive a car -Paediatric nurse -Drink alcoholic beverages -Take any medication unless instructed by your physician -Make any legal decisions or sign important papers.  Meals: Start with liquid foods such as gelatin or soup. Progress to regular foods  as tolerated. Avoid greasy, spicy, heavy foods. If nausea and/or vomiting occur, drink only clear liquids until the nausea and/or vomiting subsides. Call your physician if vomiting continues.  Special Instructions/Symptoms: Your throat may feel dry or sore from the anesthesia or the breathing tube placed in your throat during surgery. If this causes  discomfort, gargle with warm salt water. The discomfort should disappear within 24 hours.  If you had a scopolamine patch placed behind your ear for the management of post- operative nausea and/or vomiting:  1. The medication in the patch is effective for 72 hours, after which it should be removed.  Wrap patch in a tissue and discard in the trash. Wash hands thoroughly with soap and water. 2. You may remove the patch earlier than 72 hours if you experience unpleasant side effects which may include dry mouth, dizziness or visual disturbances. 3. Avoid touching the patch. Wash your hands with soap and water after contact with the patch.

## 2015-04-12 NOTE — Op Note (Signed)
OPERATIVE REPORT  DATE OF OPERATION: 04/12/2015  PATIENT:  Virginia Nash  69 y.o. female  PRE-OPERATIVE DIAGNOSIS:  Symptomatic biliary dyskinesia  POST-OPERATIVE DIAGNOSIS:  Symptomatic biliary dyskinesia  PROCEDURE:  Procedure(s): LAPAROSCOPIC CHOLECYSTECTOMY  SURGEON:  Surgeon(s): Judeth Horn, MD  ASSISTANT: None  ANESTHESIA:   general  EBL: <20 ml  BLOOD ADMINISTERED: none  DRAINS: none   SPECIMEN:  Source of Specimen:  Gallbladder and contents  COUNTS CORRECT:  YES  PROCEDURE DETAILS: The patient was taken to the operating room and placed on the table in the supine position.  After an adequate endotracheal anesthetic was administered, the patient was prepped with ChloroPrep, and then draped in the usual manner exposing the entire abdomen laterally, inferiorly and up  to the costal margins.  After a proper timeout was performed including identifying the patient and the procedure to be performed, a supraumbilical 99991111 midline incision was made using a #15 blade.  This was taken down to the fascia which was then incised with a #15 blade.  The edges of the fascia were tented up with Kocher clamps as the preperitoneal space was penetrated with a Kelly clamp into the peritoneum.  Once this was done, a pursestring suture of 0 Vicryl was passed around the fascial opening.  This was subsequently used to secure the [facility] cannula which was passed into the peritoneal cavity.  Once the [facility] cannula was in place, carbon dioxide gas was insufflated into the peritoneal cavity up to a maximal intra-abdominal pressure of 63mm Hg.The laparoscope, with attached camera and light source, was passed into the peritoneal cavity to visualize the direct insertion of two right upper quadrant 62mm cannulas, and a sup-xiphoid 86mm cannula.  Once all cannulas were in place, the dissection was begun.  Two ratcheted graspers were attached to the dome and infundibulum of the gallbladder and retracted  towards the anterior abdominal wall and the right upper quadrant.  Using cautery attached to a dissecting forceps, the peritoneum overlaying the triangle of Chalot and the hepatoduodenal triangle was dissected away exposing the cystic duct and the cystic artery.  A critical window was developed between the CBD and the cystic duct The cystic artery was clipped proximally and distally then transected.  A clip was placed on the gallbladder side of the cystic duct, then the distal cystic duct was clipped multiple times then transected between the clips.  The gallbladder was then dissected out of the hepatic bed without event.  It was retrieved from the abdomen (using an EndoCatch bag) without event.  Once the gallbladder was removed, the bed was inspected for hemostasis.  Once excellent hemostasis was obtained all gas and fluids were aspirated from above the liver, then the cannulas were removed.  The supraumbilical incision was closed using the pursestring suture which was in place.  0.25% bupivicaine with epinephrine was injected at all sites.  All 25mm or greater cannula sites were close using a running subcuticular stitch of 4-0 Monocryl.  5.66mm cannula sites were closed with Dermabond only.Steri-Strips and Tagaderm were used to complete the dressings at all sites.  At this point all needle, sponge, and instrument counts were correct.The patient was awakened from anesthesia and taken to the PACU in stable condition.  PATIENT DISPOSITION:  PACU - hemodynamically stable.   Virginia Nash 11/22/20168:45 AM

## 2015-04-13 ENCOUNTER — Telehealth: Payer: Self-pay | Admitting: Pediatrics

## 2015-04-13 ENCOUNTER — Encounter (HOSPITAL_BASED_OUTPATIENT_CLINIC_OR_DEPARTMENT_OTHER): Payer: Self-pay | Admitting: General Surgery

## 2015-04-13 NOTE — Telephone Encounter (Signed)
Had flu shot at [employer] [facility] in october

## 2015-04-19 ENCOUNTER — Encounter: Payer: Self-pay | Admitting: *Deleted

## 2015-05-11 ENCOUNTER — Ambulatory Visit (INDEPENDENT_AMBULATORY_CARE_PROVIDER_SITE_OTHER): Payer: Medicare Other | Admitting: Urology

## 2015-05-11 DIAGNOSIS — N1330 Unspecified hydronephrosis: Secondary | ICD-10-CM | POA: Diagnosis not present

## 2015-05-11 DIAGNOSIS — N3941 Urge incontinence: Secondary | ICD-10-CM

## 2015-05-11 DIAGNOSIS — Q638 Other specified congenital malformations of kidney: Secondary | ICD-10-CM | POA: Diagnosis not present

## 2015-07-26 DIAGNOSIS — Z961 Presence of intraocular lens: Secondary | ICD-10-CM | POA: Diagnosis not present

## 2015-07-26 DIAGNOSIS — H538 Other visual disturbances: Secondary | ICD-10-CM | POA: Diagnosis not present

## 2015-08-10 DIAGNOSIS — H43391 Other vitreous opacities, right eye: Secondary | ICD-10-CM | POA: Diagnosis not present

## 2015-08-18 ENCOUNTER — Encounter: Payer: Self-pay | Admitting: Pediatrics

## 2015-08-18 ENCOUNTER — Ambulatory Visit (INDEPENDENT_AMBULATORY_CARE_PROVIDER_SITE_OTHER): Payer: Medicare Other | Admitting: Pediatrics

## 2015-08-18 VITALS — BP 139/93 | HR 73 | Temp 98.2°F | Ht 61.0 in | Wt 181.8 lb

## 2015-08-18 DIAGNOSIS — Z6834 Body mass index (BMI) 34.0-34.9, adult: Secondary | ICD-10-CM | POA: Diagnosis not present

## 2015-08-18 DIAGNOSIS — IMO0001 Reserved for inherently not codable concepts without codable children: Secondary | ICD-10-CM

## 2015-08-18 DIAGNOSIS — Z1211 Encounter for screening for malignant neoplasm of colon: Secondary | ICD-10-CM | POA: Diagnosis not present

## 2015-08-18 DIAGNOSIS — Z9049 Acquired absence of other specified parts of digestive tract: Secondary | ICD-10-CM | POA: Diagnosis not present

## 2015-08-18 DIAGNOSIS — R030 Elevated blood-pressure reading, without diagnosis of hypertension: Secondary | ICD-10-CM | POA: Diagnosis not present

## 2015-08-18 NOTE — Progress Notes (Signed)
    Subjective:    Patient ID: Virginia Nash, female    DOB: 1945-07-19, 70 y.o.   MRN: [id number]  CC: follow up multiple med problems  HPI: Virginia Nash is a 70 y.o. female presenting for follow up multiple med problems  Had gall bladder removed last fall Still with some cramping at times when eating Mostly able to control it with what she eats  Still with some stress incontinence Trying to do timed voids  Had hysterectomy for endometrial overgrowth --Oct 2014 Is not sure if cervix was removed or not.  Mammogram: last in 03/2015, normal  No blood in stools, no constipation  Building new small house if old house gets sold Appetite normal Trying to eat more fruits/veg, avoiding preservatives Goes to [facility] 2-3 days a week, does housework at home the other days Husband going to [place] regularly OA limits exercise some but she continues to exercise   Depression screen [facility] 2/9 08/18/2015 02/24/2015  Decreased Interest 0 0  Down, Depressed, Hopeless 0 0  PHQ - 2 Score 0 0     Relevant past medical, surgical, family and social history reviewed and updated as indicated. Interim medical history since our last visit reviewed. Allergies and medications reviewed and updated.    ROS: Per HPI unless specifically indicated above  History  Smoking status  . Never Smoker   Smokeless tobacco  . Not on file    Past Medical History Patient Active Problem List   Diagnosis Date Noted  . BMI 36.0-36.9,adult 02/24/2015  . Arthritis, senescent 02/24/2015    Current Outpatient Prescriptions  Medication Sig Dispense Refill  . aspirin 81 MG tablet Take 81 mg by mouth daily. Reported on 08/18/2015     No current facility-administered medications for this visit.       Objective:    BP 139/93 mmHg  Pulse 73  Temp(Src) 98.2 F (36.8 C) (Oral)  Ht 5\' 1"  (1.549 m)  Wt 181 lb 12.8 oz (82.464 kg)  BMI 34.37 kg/m2  Wt Readings from Last 3 Encounters:  08/18/15 181 lb 12.8 oz  (82.464 kg)  04/12/15 193 lb (87.544 kg)  02/24/15 195 lb 9.6 oz (88.724 kg)     Gen: NAD, alert, cooperative with exam, NCAT EYES: EOMI, no scleral injection or icterus ENT:  TMs pearly gray b/l, OP without erythema LYMPH: no cervical LAD CV: NRRR, normal S1/S2, no murmur, distal pulses 2+ b/l Resp: CTABL, no wheezes, normal WOB Abd: +BS, soft, NTND. no guarding or organomegaly Ext: No edema, warm Neuro: Alert and oriented, strength equal b/l UE and LE, coordination grossly normal MSK: normal muscle bulk     Assessment & Plan:    Tyshana was seen today for follow up multiple med problems.  Diagnoses and all orders for this visit:  BMI 34  Has lost 15 lbs since gall bladder surgery Watching what she eats Staying active Continue lifestyle changes  Elevated blood pressure Check at home, gave parameters to stay within, if elevated let me know Not on any BP meds  Colon cancer screening Due for colonoscopy in 2018, last 10 years ago Done at [facility]  S/P cholecystectomy Healing well, still sometimes with cramping after eating, if she watches what she eats she can usually avoid any pain   Follow up plan: Return in about 7 months (around 03/05/2016) for med follow up, labs.  [name], MD [facility] 08/18/2015, 11:15 AM

## 2015-08-18 NOTE — Patient Instructions (Addendum)
If blood pressure is greater than 140 on top or greater than 90 on bottom regularly let me know.

## 2015-08-25 ENCOUNTER — Encounter: Payer: Medicare Other | Admitting: Pediatrics

## 2015-11-30 ENCOUNTER — Encounter: Payer: Self-pay | Admitting: Pediatrics

## 2015-11-30 ENCOUNTER — Ambulatory Visit (INDEPENDENT_AMBULATORY_CARE_PROVIDER_SITE_OTHER): Payer: Medicare Other | Admitting: Pediatrics

## 2015-11-30 VITALS — BP 119/78 | HR 69 | Temp 97.3°F | Ht 61.0 in | Wt 176.4 lb

## 2015-11-30 DIAGNOSIS — R399 Unspecified symptoms and signs involving the genitourinary system: Secondary | ICD-10-CM | POA: Diagnosis not present

## 2015-11-30 DIAGNOSIS — R1084 Generalized abdominal pain: Secondary | ICD-10-CM

## 2015-11-30 DIAGNOSIS — Z1211 Encounter for screening for malignant neoplasm of colon: Secondary | ICD-10-CM

## 2015-11-30 DIAGNOSIS — Z6833 Body mass index (BMI) 33.0-33.9, adult: Secondary | ICD-10-CM | POA: Diagnosis not present

## 2015-11-30 DIAGNOSIS — R197 Diarrhea, unspecified: Secondary | ICD-10-CM

## 2015-11-30 DIAGNOSIS — K915 Postcholecystectomy syndrome: Secondary | ICD-10-CM | POA: Diagnosis not present

## 2015-11-30 LAB — URINALYSIS, COMPLETE
BILIRUBIN UA: NEGATIVE
Glucose, UA: NEGATIVE
Ketones, UA: NEGATIVE
LEUKOCYTES UA: NEGATIVE
Nitrite, UA: NEGATIVE
PH UA: 5.5 (ref 5.0–7.5)
PROTEIN UA: NEGATIVE
RBC UA: NEGATIVE
Urobilinogen, Ur: 0.2 mg/dL (ref 0.2–1.0)

## 2015-11-30 LAB — MICROSCOPIC EXAMINATION
Epithelial Cells (non renal): >10 /hpf — AB (ref 0–10)
RBC, UA: NONE SEEN /hpf (ref 0–?)

## 2015-11-30 MED ORDER — CHOLESTYRAMINE 4 GM/DOSE PO POWD: 4.0000 g | Freq: Two times a day (BID) | ORAL | Status: DC

## 2015-11-30 NOTE — Progress Notes (Signed)
    Subjective:    Patient ID: Virginia Nash, female    DOB: 06-04-1945, 70 y.o.   MRN: [id number]  CC: Abdominal Pain and cramping, frequent stooling   HPI: Virginia Nash is a 70 y.o. female presenting for Abdominal Pain and Urinary Frequency  Avoiding fat since gallbladder out Not eating dairy Having stools every day Having cramping every day at some point since gall bladder out  Not taking any OTC Taking advil intermittently for aches and pains, no other OTC meds Has urgency to use bathroom for stooling Sometimes then just cramps and cant go Other times has loose stool Happens multiple times some days No dysuria, no frequency of urination No fevers Has lost 17 lbs over past 8 months since surgery She has drastically changed what she is eating to avoid cramping/loose stools No emesis, no nausea Belly feels sore with cramping, no other pain No blood in stools   Depression screen [facility] 2/9 11/30/2015 08/18/2015 02/24/2015  Decreased Interest 0 0 0  Down, Depressed, Hopeless 0 0 0  PHQ - 2 Score 0 0 0     Relevant past medical, surgical, family and social history reviewed and updated as indicated.  Interim medical history since our last visit reviewed. Allergies and medications reviewed and updated.  ROS: Per HPI unless specifically indicated above  History  Smoking status  . Never Smoker   Smokeless tobacco  . Not on file       Objective:    BP 119/78 mmHg  Pulse 69  Temp(Src) 97.3 F (36.3 C) (Oral)  Ht 5\' 1"  (1.549 m)  Wt 176 lb 6.4 oz (80.015 kg)  BMI 33.35 kg/m2  Wt Readings from Last 3 Encounters:  11/30/15 176 lb 6.4 oz (80.015 kg)  08/18/15 181 lb 12.8 oz (82.464 kg)  04/12/15 193 lb (87.544 kg)     Gen: NAD, alert, cooperative with exam, NCAT EYES: EOMI, no scleral injection or icterus CV: NRRR, normal S1/S2, no murmur, distal pulses 2+ b/l Resp:  normal WOB Abd: +BS, soft, NTND. no guarding or organomegaly Ext: No edema, warm Neuro:  Alert and oriented     Assessment & Plan:    Virginia Nash was seen today for abdominal pain and cramping.  Diagnoses and all orders for this visit:  Post-cholecystectomy syndrome Start with one pack a day, increase to two packs in 2 weeks Continue to avoid fatty foods -     cholestyramine (QUESTRAN) 4 GM/DOSE powder; Take 1 packet (4 g total) by mouth 2 (two) times daily with a meal.  UTI symptoms Normal UA -     Urinalysis, Complete  BMI 33.0-33.9,adult Weight loss since cholescystectomy Continue to monitor weight  Diarrhea, unspecified type Likely loose stools due to above  Generalized abdominal pain  Colon ca screening: had colonoscopy at [place] apprx 8-10 yrs ago. WIll have her sign so we can get records.  Follow up plan: Return in about 3 months (around 03/01/2016).  [name], MD [facility] 11/30/2015, 4:31 PM

## 2015-11-30 NOTE — Patient Instructions (Signed)
Start with one pack once a day Increase to two packs after a couple weeks

## 2016-01-03 ENCOUNTER — Telehealth: Payer: Self-pay | Admitting: Pediatrics

## 2016-03-16 DIAGNOSIS — Z23 Encounter for immunization: Secondary | ICD-10-CM | POA: Diagnosis not present

## 2016-03-19 ENCOUNTER — Ambulatory Visit: Payer: Medicare Other | Admitting: Pediatrics

## 2016-03-28 ENCOUNTER — Ambulatory Visit: Payer: Medicare Other | Admitting: Pediatrics

## 2016-04-02 ENCOUNTER — Encounter: Payer: Self-pay | Admitting: Pediatrics

## 2016-04-02 ENCOUNTER — Ambulatory Visit (INDEPENDENT_AMBULATORY_CARE_PROVIDER_SITE_OTHER): Payer: Medicare Other | Admitting: Pediatrics

## 2016-04-02 VITALS — BP 102/68 | HR 65 | Temp 97.2°F | Ht 61.0 in | Wt 170.4 lb

## 2016-04-02 DIAGNOSIS — Z6832 Body mass index (BMI) 32.0-32.9, adult: Secondary | ICD-10-CM | POA: Diagnosis not present

## 2016-04-02 DIAGNOSIS — Z1211 Encounter for screening for malignant neoplasm of colon: Secondary | ICD-10-CM | POA: Diagnosis not present

## 2016-04-02 DIAGNOSIS — M1711 Unilateral primary osteoarthritis, right knee: Secondary | ICD-10-CM | POA: Diagnosis not present

## 2016-04-02 DIAGNOSIS — Z1231 Encounter for screening mammogram for malignant neoplasm of breast: Secondary | ICD-10-CM | POA: Diagnosis not present

## 2016-04-02 DIAGNOSIS — K915 Postcholecystectomy syndrome: Secondary | ICD-10-CM | POA: Diagnosis not present

## 2016-04-02 DIAGNOSIS — Z1239 Encounter for other screening for malignant neoplasm of breast: Secondary | ICD-10-CM

## 2016-04-02 NOTE — Progress Notes (Signed)
  Subjective:   Patient ID: Virginia Nash, female    DOB: Aug 14, 1945, 70 y.o.   MRN: [id number] CC: Follow-up (Medication)  HPI: Virginia Nash is a 70 y.o. female presenting for Follow-up (Medication)  OTC meds for post-cholecystectomy syndrome have been working Avoiding certain foods, diarrhea mostly well-controlled  BMI elevated: Weight loss over past few months Trying to avoid fatty foods  Pap smears: still getting checked due to history of gyn cancer, pt isnt sure what kind of cancer Has appt with gynecology upcoming  Has had DEXA scan within last 1-2 years, started on calcium  Relevant past medical, surgical, family and social history reviewed. Allergies and medications reviewed and updated. History  Smoking Status  . Never Smoker  Smokeless Tobacco  . Never Used   ROS: Per HPI   Objective:    BP 102/68   Pulse 65   Temp 97.2 F (36.2 C) (Oral)   Ht 5\' 1"  (1.549 m)   Wt 170 lb 6.4 oz (77.3 kg)   BMI 32.20 kg/m   Wt Readings from Last 3 Encounters:  04/02/16 170 lb 6.4 oz (77.3 kg)  11/30/15 176 lb 6.4 oz (80 kg)  08/18/15 181 lb 12.8 oz (82.5 kg)    Gen: NAD, alert, cooperative with exam, NCAT EYES: EOMI, no conjunctival injection, or no icterus ENT:  TMs pearly gray b/l, OP without erythema LYMPH: no cervical LAD CV: NRRR, normal S1/S2, no murmur, distal pulses 2+ b/l Resp: CTABL, no wheezes, normal WOB Abd: +BS, soft, NTND. no guarding or organomegaly Ext: No edema, warm Neuro: Alert and oriented, strength equal b/l UE and LE, coordination grossly normal MSK: normal muscle bulk  Assessment & Plan:  [name] was seen today for follow-up multiple med problems.  Diagnoses and all orders for this visit:  Post-cholecystectomy syndrome Symptoms well controlled  Osteoarthritis of right knee, unspecified osteoarthritis type Controlled symptoms  BMI 32.0-32.9,adult Cont lifestyle changes, weight down 10 lbs with change in diet after  cholecystectomy  Screening for breast cancer Mammogram scheduled for next week  Screen for colon cancer Colonoscopy due in 2018   Flu shot at [facility] around 03/22/2016 Follow up plan: 1 yr [name], MD [place]

## 2016-04-04 DIAGNOSIS — Z1239 Encounter for other screening for malignant neoplasm of breast: Secondary | ICD-10-CM | POA: Diagnosis not present

## 2016-04-04 DIAGNOSIS — Z1272 Encounter for screening for malignant neoplasm of vagina: Secondary | ICD-10-CM | POA: Diagnosis not present

## 2016-04-04 DIAGNOSIS — Z6831 Body mass index (BMI) 31.0-31.9, adult: Secondary | ICD-10-CM | POA: Diagnosis not present

## 2016-04-04 DIAGNOSIS — Z01419 Encounter for gynecological examination (general) (routine) without abnormal findings: Secondary | ICD-10-CM | POA: Diagnosis not present

## 2016-05-16 ENCOUNTER — Encounter: Payer: Medicare Other | Admitting: *Deleted

## 2016-05-16 DIAGNOSIS — Z1231 Encounter for screening mammogram for malignant neoplasm of breast: Secondary | ICD-10-CM | POA: Diagnosis not present

## 2016-08-16 DIAGNOSIS — H04123 Dry eye syndrome of bilateral lacrimal glands: Secondary | ICD-10-CM | POA: Diagnosis not present

## 2016-08-16 DIAGNOSIS — Z961 Presence of intraocular lens: Secondary | ICD-10-CM | POA: Diagnosis not present

## 2016-10-31 ENCOUNTER — Encounter: Payer: Self-pay | Admitting: Pediatrics

## 2016-10-31 ENCOUNTER — Encounter (INDEPENDENT_AMBULATORY_CARE_PROVIDER_SITE_OTHER): Payer: Self-pay

## 2016-10-31 ENCOUNTER — Encounter (INDEPENDENT_AMBULATORY_CARE_PROVIDER_SITE_OTHER): Payer: Self-pay | Admitting: Internal Medicine

## 2016-10-31 ENCOUNTER — Ambulatory Visit (INDEPENDENT_AMBULATORY_CARE_PROVIDER_SITE_OTHER): Payer: Medicare Other | Admitting: Pediatrics

## 2016-10-31 VITALS — BP 116/71 | HR 76 | Temp 96.8°F | Resp 18 | Ht 61.0 in | Wt 174.2 lb

## 2016-10-31 DIAGNOSIS — R1310 Dysphagia, unspecified: Secondary | ICD-10-CM | POA: Diagnosis not present

## 2016-10-31 DIAGNOSIS — J069 Acute upper respiratory infection, unspecified: Secondary | ICD-10-CM

## 2016-10-31 DIAGNOSIS — Z1159 Encounter for screening for other viral diseases: Secondary | ICD-10-CM | POA: Diagnosis not present

## 2016-10-31 MED ORDER — AZITHROMYCIN 250 MG PO TABS: ORAL_TABLET | ORAL | 0 refills | Status: DC

## 2016-10-31 NOTE — Patient Instructions (Signed)
Netipot with distilled water 2-3 times a day to clear out sinuses Or Normal saline nasal spray Flonase steroid nasal spray Antihistamine daily such as cetirizine Lots of fluids  Start antibiotic if worsening next week

## 2016-10-31 NOTE — Progress Notes (Signed)
  Subjective:   Patient ID: Virginia Nash, female    DOB: [birth year], 71 y.o.   MRN: [id number] CC: Follow-up (7 month); Cough; Nasal Congestion; and Sore Throat  HPI: Virginia Nash is a 71 y.o. female presenting for Follow-up (7 month); Cough; Nasal Congestion; and Sore Throat  Having a hard time swallowing certain foods Rice gets stuck sometimes Other foods can get stuck Had a hamburger last week, able to eat half, did ok Weight has been stable, no weight loss  Sore throat with swallowing and with coughing Having coughing fits Ongoing past 4 days Some chills, bothered most by the couhg  tried husbands albuterol, doesn't think it helped Does feel like she has had some wheezing at times, not all the time  Has tried expectorant for cough at home Tried breathing treatment at home, didn't help  Relevant past medical, surgical, family and social history reviewed. Allergies and medications reviewed and updated. History  Smoking Status  . Never Smoker  Smokeless Tobacco  . Never Used   ROS: Per HPI   Objective:    BP 116/71   Pulse 76   Temp (!) 96.8 F (36 C) (Oral)   Resp 18   Ht 5\' 1"  (1.549 m)   Wt 174 lb 3.2 oz (79 kg)   SpO2 95%   BMI 32.91 kg/m   Wt Readings from Last 3 Encounters:  10/31/16 174 lb 3.2 oz (79 kg)  04/02/16 170 lb 6.4 oz (77.3 kg)  11/30/15 176 lb 6.4 oz (80 kg)    Gen: NAD, alert, cooperative with exam, NCAT, congested, coughing some EYES: EOMI, no conjunctival injection, or no icterus ENT:  TMs pearly gray b/l, OP without erythema LYMPH: no cervical LAD CV: NRRR, normal S1/S2, no murmur Resp: CTABL, no wheezes, normal WOB Abd: +BS, soft, NTND. no guarding or organomegaly Ext: No edema, warm Neuro: Alert and oriented  Assessment & Plan:  [name] was seen today for follow-up, cough, nasal congestion and sore throat.  Diagnoses and all orders for this visit:  Acute URI Discussed symptom care If not improving by early next week or for  worsening can start azithromycin for bromchitis  Need for hepatitis C screening test -     Hepatitis C antibody  Dysphagia, unspecified type Thicker foods worse, fluids sometimes affected as well -     Ambulatory referral to Gastroenterology  Other orders -     azithromycin (ZITHROMAX) 250 MG tablet; Take 2 the first day and then one each day after.   Follow up plan: 8 weeks [name], MD [place]

## 2016-11-13 ENCOUNTER — Ambulatory Visit (INDEPENDENT_AMBULATORY_CARE_PROVIDER_SITE_OTHER): Payer: Medicare Other | Admitting: Internal Medicine

## 2016-11-26 ENCOUNTER — Encounter (INDEPENDENT_AMBULATORY_CARE_PROVIDER_SITE_OTHER): Payer: Self-pay | Admitting: *Deleted

## 2016-11-26 ENCOUNTER — Encounter (INDEPENDENT_AMBULATORY_CARE_PROVIDER_SITE_OTHER): Payer: Self-pay | Admitting: Internal Medicine

## 2016-11-26 ENCOUNTER — Ambulatory Visit (INDEPENDENT_AMBULATORY_CARE_PROVIDER_SITE_OTHER): Payer: Medicare Other | Admitting: Internal Medicine

## 2016-11-26 VITALS — BP 106/80 | HR 64 | Temp 97.6°F | Ht 61.0 in | Wt 176.9 lb

## 2016-11-26 DIAGNOSIS — R1310 Dysphagia, unspecified: Secondary | ICD-10-CM

## 2016-11-26 DIAGNOSIS — R1319 Other dysphagia: Secondary | ICD-10-CM

## 2016-11-26 NOTE — Progress Notes (Signed)
`  06/ 

## 2016-11-26 NOTE — Patient Instructions (Signed)
DG esophagram.   

## 2016-11-26 NOTE — Progress Notes (Signed)
   Subjective:    Patient ID: Virginia Nash, female    DOB: 15-Aug-1945, 71 y.o.   MRN: [id number]  HPI Referred by Dr. Evette Doffing for dysphagia. She avoids rice unless it is very soft. She also has trouble drinking water. She also has trouble with applesauce. Really no trouble with meats. Mostly eat chicken. Does not eat much beef. No problems with bread. Appetite is good. No weight loss. Has a BM 2-3 times  a day  Her last colonoscopy at [facility] (Dr. Lindalou Hose) 9 yrs ago and she reports it was normal.     Cholecystectomy 1 1/2 yrs ago.    Review of Systems Past Medical History:  Diagnosis Date  . Allergy   . Arthritis   . Asthma    as child  . Biliary dyskinesia   . Blood transfusion without reported diagnosis   . Cancer ([place])   . Cataract     Past Surgical History:  Procedure Laterality Date  . ABDOMINAL HYSTERECTOMY    . CHOLECYSTECTOMY N/A 04/12/2015   Procedure: LAPAROSCOPIC CHOLECYSTECTOMY;  Surgeon: Judeth Horn, MD;  Location: [place];  Service: General;  Laterality: N/A;  . DILATION AND CURETTAGE OF UTERUS    . EYE SURGERY     cataract with lens replacement  . TONSILLECTOMY      Allergies  Allergen Reactions  . Codeine     Causes nausea  . Peanuts [Peanut Oil]     Current Outpatient Prescriptions on File Prior to Visit  Medication Sig Dispense Refill  . aspirin 81 MG tablet Take 81 mg by mouth daily. Reported on 08/18/2015    . Investigational vitamin D 600 UNITS capsule SWOG S0812 Take 800 Units by mouth daily. Take with food.      No current facility-administered medications on file prior to visit.         Objective:   Physical Exam Blood pressure 106/80, pulse 64, temperature 97.6 F (36.4 C), height 5\' 1"  (1.549 m), weight 176 lb 14.4 oz (80.2 kg). Alert and oriented. Skin warm and dry. Oral mucosa is moist.   . Sclera anicteric, conjunctivae is pink. Thyroid not enlarged. No cervical lymphadenopathy. Lungs clear. Heart regular  rate and rhythm.  Abdomen is soft. Bowel sounds are positive. No hepatomegaly. No abdominal masses felt. No tenderness.  No edema to lower extremities.           Assessment & Plan:  Dysphagia to solids and liquids. Am going to get a DG Esophogram.

## 2016-11-30 ENCOUNTER — Ambulatory Visit (HOSPITAL_COMMUNITY)
Admission: RE | Admit: 2016-11-30 | Discharge: 2016-11-30 | Disposition: A | Discharge status: Home / Self Care | Payer: Medicare Other | Source: Ambulatory Visit | Attending: Internal Medicine | Admitting: Internal Medicine

## 2016-11-30 ENCOUNTER — Ambulatory Visit: Payer: Medicare Other | Admitting: Pediatrics

## 2016-11-30 DIAGNOSIS — R1310 Dysphagia, unspecified: Secondary | ICD-10-CM | POA: Diagnosis not present

## 2016-11-30 DIAGNOSIS — K449 Diaphragmatic hernia without obstruction or gangrene: Secondary | ICD-10-CM | POA: Diagnosis not present

## 2016-11-30 DIAGNOSIS — R1319 Other dysphagia: Secondary | ICD-10-CM

## 2016-11-30 DIAGNOSIS — R4702 Dysphasia: Secondary | ICD-10-CM | POA: Diagnosis not present

## 2016-12-13 ENCOUNTER — Ambulatory Visit: Payer: Medicare Other | Admitting: Pediatrics

## 2016-12-19 ENCOUNTER — Encounter: Payer: Self-pay | Admitting: Pediatrics

## 2016-12-19 ENCOUNTER — Ambulatory Visit (INDEPENDENT_AMBULATORY_CARE_PROVIDER_SITE_OTHER): Payer: Medicare Other | Admitting: Pediatrics

## 2016-12-19 VITALS — BP 112/69 | HR 65 | Temp 97.2°F | Ht 61.0 in | Wt 176.6 lb

## 2016-12-19 DIAGNOSIS — R5383 Other fatigue: Secondary | ICD-10-CM | POA: Diagnosis not present

## 2016-12-19 DIAGNOSIS — Z136 Encounter for screening for cardiovascular disorders: Secondary | ICD-10-CM | POA: Diagnosis not present

## 2016-12-19 DIAGNOSIS — Z13220 Encounter for screening for lipoid disorders: Secondary | ICD-10-CM

## 2016-12-19 DIAGNOSIS — Z1159 Encounter for screening for other viral diseases: Secondary | ICD-10-CM | POA: Diagnosis not present

## 2016-12-19 DIAGNOSIS — R1310 Dysphagia, unspecified: Secondary | ICD-10-CM

## 2016-12-19 NOTE — Progress Notes (Signed)
  Subjective:   Patient ID: Virginia Nash, female    DOB: [birth year], 71 y.o.   MRN: [id number] CC: Follow-up (Trouble Swallowing)  HPI: Virginia Nash is a 71 y.o. female presenting for Follow-up (Trouble Swallowing)  Was seen by GI for dysphagia since last visit Says has been ongoing for years Had esophagram, no penetration or aspiration Advised to tuck chin with swallowing She has been trying, thinks has been helping some Still has food get caught, rice, sometimes other foods The food wont come back up, drinking water doesn't help, has to gag the food back up Says will be fine with swallowing then will not be able to, sometimes happens with her saliva  Has had slightly elevated lipid panel in the past  Feeling more tired recently E.g. Used to be able to do 5 projects in a day, now 1 day for two  No SOB, no CP, no swelling  Relevant past medical, surgical, family and social history reviewed. Allergies and medications reviewed and updated. History  Smoking Status  . Never Smoker  Smokeless Tobacco  . Never Used   ROS: Per HPI   Objective:    BP 112/69   Pulse 65   Temp (!) 97.2 F (36.2 C) (Oral)   Ht '5\' 1"'$  (1.549 m)   Wt 176 lb 9.6 oz (80.1 kg)   BMI 33.37 kg/m   Wt Readings from Last 3 Encounters:  12/19/16 176 lb 9.6 oz (80.1 kg)  11/26/16 176 lb 14.4 oz (80.2 kg)  10/31/16 174 lb 3.2 oz (79 kg)    Gen: NAD, alert, cooperative with exam, NCAT EYES: EOMI, no conjunctival injection, or no icterus ENT:  TMs pearly gray b/l, OP without erythema LYMPH: no cervical LAD NECK: normal thyroid CV: NRRR, normal S1/S2, no murmur, distal pulses 2+ b/l Resp: CTABL, no wheezes, normal WOB Abd: +BS, soft, NTND. no guarding or organomegaly Ext: No edema, warm Neuro: Alert and oriented, strength equal b/l UE and LE, coordination grossly normal MSK: normal muscle bulk  Assessment & Plan:  Virginia Nash was seen today for follow-up multiple med problems  Diagnoses and all  orders for this visit:  Dysphagia, unspecified type Comes out of the blue, normal esophagram, not aspirating, no masses Will ctm symptoms  Other fatigue -     BMP8+EGFR -     TSH  Screening for hyperlipidemia -     Lipid panel  Need for hepatitis C screening test -     Hepatitis C antibody   Follow up plan: Return in about 1 year (around 12/19/2017) for complete physical. [name], MD [place]

## 2016-12-20 LAB — BMP8+EGFR
BUN / CREAT RATIO: 21 (ref 12–28)
BUN: 18 mg/dL (ref 8–27)
CHLORIDE: 100 mmol/L (ref 96–106)
CO2: 26 mmol/L (ref 20–29)
Calcium: 9.2 mg/dL (ref 8.7–10.3)
Creatinine, Ser: 0.86 mg/dL (ref 0.57–1.00)
GFR, EST AFRICAN AMERICAN: 80 mL/min/{1.73_m2} (ref >59)
GFR, EST NON AFRICAN AMERICAN: 70 mL/min/{1.73_m2} (ref >59)
Glucose: 103 mg/dL — ABNORMAL HIGH (ref 65–99)
POTASSIUM: 4.6 mmol/L (ref 3.5–5.2)
Sodium: 138 mmol/L (ref 134–144)

## 2016-12-20 LAB — LIPID PANEL
Chol/HDL Ratio: 3.4 ratio (ref 0.0–4.4)
Cholesterol, Total: 172 mg/dL (ref 100–199)
HDL: 50 mg/dL (ref >39)
LDL Calculated: 92 mg/dL (ref 0–99)
TRIGLYCERIDES: 152 mg/dL — AB (ref 0–149)
VLDL Cholesterol Cal: 30 mg/dL (ref 5–40)

## 2016-12-20 LAB — TSH: TSH: 3.93 u[IU]/mL (ref 0.450–4.500)

## 2016-12-20 LAB — HEPATITIS C ANTIBODY: Hep C Virus Ab: <0.1 s/co ratio (ref 0.0–0.9)

## 2016-12-25 ENCOUNTER — Ambulatory Visit (INDEPENDENT_AMBULATORY_CARE_PROVIDER_SITE_OTHER): Payer: Medicare Other | Admitting: *Deleted

## 2016-12-25 ENCOUNTER — Encounter (INDEPENDENT_AMBULATORY_CARE_PROVIDER_SITE_OTHER): Payer: Self-pay | Admitting: *Deleted

## 2016-12-25 ENCOUNTER — Ambulatory Visit (INDEPENDENT_AMBULATORY_CARE_PROVIDER_SITE_OTHER): Payer: Medicare Other

## 2016-12-25 VITALS — BP 122/77 | HR 60 | Temp 97.0°F | Ht 61.0 in | Wt 177.4 lb

## 2016-12-25 DIAGNOSIS — Z780 Asymptomatic menopausal state: Secondary | ICD-10-CM

## 2016-12-25 DIAGNOSIS — Z0000 Encounter for general adult medical examination without abnormal findings: Secondary | ICD-10-CM

## 2016-12-25 DIAGNOSIS — Z1211 Encounter for screening for malignant neoplasm of colon: Secondary | ICD-10-CM

## 2016-12-25 NOTE — Patient Instructions (Addendum)
Keep Follow up appt with Dr. Evette Nash GI Referral placed for Colonoscopy Bring Copy of Advance Directive    Virginia Nash , Thank you for taking time to come for your Medicare Wellness Visit. I appreciate your ongoing commitment to your health goals. Please review the following plan we discussed and let me know if I can assist you in the future.   These are the goals we discussed: Goals    . Exercise 3x per week (30 min per time)          Try to exercise for 30 minutes 3 times weekly       This is a list of the screening recommended for you and due dates:  Health Maintenance  Topic Date Due  . DEXA scan (bone density measurement)  12/27/2012  . Flu Shot  12/19/2016  . Tetanus Vaccine  12/19/2017*  . Colon Cancer Screening  01/29/2017  . Mammogram  05/16/2018  .  Hepatitis C: One time screening is recommended by Center for Disease Control  (CDC) for  adults born from [birth year] through 1963.   Completed  . Pneumonia vaccines  Completed  *Topic was postponed. The date shown is not the original due date.

## 2016-12-25 NOTE — Progress Notes (Addendum)
Subjective:   Virginia Nash is a 71 y.o. female who presents for an Initial Medicare Annual Wellness Visit.  Virginia Nash previously worked at [employer] of 15 years.  She live at home with her husband and cat.  She has 4 kids, 8 grand kids and 4 great grand kids who visits often.  Virginia Nash enjoys spending time with family, sewing, shopping and antiquing.  She eats healthy meals but does not currently exercise.  Virginia Nash has had no hospitalizations or surgeries in the past year.   Overall Virginia Nash feels that her health is about the same as it was a year ago.      Objective:    Today's Vitals   12/25/16 0957  BP: 122/77  Pulse: 60  Temp: (!) 97 F (36.1 C)  TempSrc: Oral  Weight: 177 lb 6.4 oz (80.5 kg)  Height: 5\' 1"  (1.549 m)   Body mass index is 33.52 kg/m.   Current Medications (verified) Outpatient Encounter Prescriptions as of 12/25/2016  Medication Sig  . aspirin 81 MG tablet Take 81 mg by mouth daily. Reported on 08/18/2015  . Investigational vitamin D 600 UNITS capsule SWOG S0812 Take 1,200 Units by mouth daily. Take with food.    No facility-administered encounter medications on file as of 12/25/2016.     Allergies (verified) Codeine and Peanuts [peanut oil]   History: Past Medical History:  Diagnosis Date  . Allergy   . Arthritis   . Asthma    as child  . Biliary dyskinesia   . Blood transfusion without reported diagnosis   . Cancer ([place])   . Cataract    Past Surgical History:  Procedure Laterality Date  . ABDOMINAL HYSTERECTOMY    . CHOLECYSTECTOMY N/A 04/12/2015   Procedure: LAPAROSCOPIC CHOLECYSTECTOMY;  Surgeon: Judeth Horn, MD;  Location: [facility];  Service: General;  Laterality: N/A;  . DILATION AND CURETTAGE OF UTERUS    . EYE SURGERY     cataract with lens replacement  . TONSILLECTOMY     Family History  Problem Relation Age of Onset  . Arthritis Mother   . Cancer Mother   . Cancer Father   . Hearing  loss Sister   . Stroke Maternal Grandmother   . Kidney disease Paternal Grandmother    Social History   Occupational History  . Not on file.   Social History Main Topics  . Smoking status: Never Smoker  . Smokeless tobacco: Never Used  . Alcohol use No  . Drug use: No  . Sexual activity: Not on file    Tobacco Counseling Never smoker   Activities of Daily Living In your present state of health, do you have any difficulty performing the following activities: 12/25/2016  Hearing? N  Vision? N  Difficulty concentrating or making decisions? N  Walking or climbing stairs? N  Dressing or bathing? N  Doing errands, shopping? N  Some recent data might be hidden  No Trouble with ADLs.  Immunizations and Health Maintenance Immunization History  Administered Date(s) Administered  . Influenza-Unspecified 03/02/2015, 03/16/2016  . Pneumococcal Conjugate-13 10/21/2013  . Pneumococcal Polysaccharide-23 02/15/2015   Health Maintenance Due  Topic Date Due  . DEXA SCAN  12/27/2012  . INFLUENZA VACCINE  12/19/2016  Dexa Scan to be done today  Patient Care Team: Eustaquio Maize, MD as PCP - General (Pediatrics) Gari Crown, MD (Unknown Physician Specialty)  Indicate any recent Medical Services you may have received from other than  [facility] providers in the past year (date may be approximate).     Assessment:   This is a routine wellness examination for Virginia Nash.   Hearing/Vision screen Had recent eye exam done in March of 2018.    Dietary issues and exercise activities discussed: Current Exercise Habits: The patient does not participate in regular exercise at present, Exercise limited by: orthopedic condition(s)  Goals    . Exercise 3x per week (30 min per time)          Try to exercise for 30 minutes 3 times weekly      Depression Screen PHQ 2/9 Scores 12/25/2016 12/19/2016 10/31/2016 04/02/2016 11/30/2015 08/18/2015 02/24/2015  PHQ - 2 Score 0 0 0 0 0 0 0    Fall Risk Fall  Risk  12/25/2016 12/19/2016 10/31/2016 04/02/2016 11/30/2015  Falls in the past year? No No No No No    Cognitive Function: MMSE - Mini Mental State Exam 12/25/2016  Orientation to time 5  Orientation to Place 5  Registration 3  Attention/ Calculation 5  Recall 3  Language- name 2 objects 2  Language- repeat 1  Language- follow 3 step command 3  Language- read & follow direction 1  Write a sentence 1  Copy design 1  Total score 30  No evidence of Memory loss at this time.       Screening Tests Health Maintenance  Topic Date Due  . DEXA SCAN  12/27/2012  . INFLUENZA VACCINE  12/19/2016  . TETANUS/TDAP  12/19/2017 (Originally 12/28/1964)  . COLONOSCOPY  01/29/2017  . MAMMOGRAM  05/16/2018  . Hepatitis C Screening  Completed  . PNA vac Low Risk Adult  Completed  Dexa Scan to be done today GI referral placed for colonoscopy Declined Tdap and Shingrix at this time due to pricing.    Plan:   Encourage to keep 1 year follow up with Dr. Evette Doffing  Tdap and Shingrix pricing to be checked at a later date. Dexa Scan to be done today Encouraged to continue to eat healthy meals Encouraged to exercise for at least 30 minutes 3 times weekly. Encouraged to bring in copy of Advance Directive to be scanned into chart.   I have personally reviewed and noted the following in the patient's chart:   . Medical and social history . Use of alcohol, tobacco or illicit drugs  . Current medications and supplements . Functional ability and status . Nutritional status . Physical activity . Advanced directives . List of other physicians . Hospitalizations, surgeries, and ER visits in previous 12 months . Vitals . Screenings to include cognitive, depression, and falls . Referrals and appointments  In addition, I have reviewed and discussed with patient certain preventive protocols, quality metrics, and best practice recommendations. A written personalized care plan for preventive services as well as  general preventive health recommendations were provided to patient.     Wardell Heath, LPN   08/26/927   I have reviewed and agree with the above AWV documentation.   Mary-Margaret Hassell Done, FNP

## 2017-01-30 DIAGNOSIS — Z23 Encounter for immunization: Secondary | ICD-10-CM | POA: Diagnosis not present

## 2017-02-22 ENCOUNTER — Telehealth: Payer: Self-pay | Admitting: Pediatrics

## 2017-02-22 ENCOUNTER — Other Ambulatory Visit: Payer: Self-pay | Admitting: Family Medicine

## 2017-02-22 MED ORDER — SCOPOLAMINE 1 MG/3DAYS TD PT72: MEDICATED_PATCH | TRANSDERMAL | 0 refills | Status: DC

## 2017-02-22 NOTE — Telephone Encounter (Signed)
Pt aware.

## 2017-02-22 NOTE — Telephone Encounter (Signed)
I sent in the requested prescription 

## 2017-04-10 ENCOUNTER — Other Ambulatory Visit (INDEPENDENT_AMBULATORY_CARE_PROVIDER_SITE_OTHER): Payer: Self-pay | Admitting: *Deleted

## 2017-04-10 DIAGNOSIS — Z1211 Encounter for screening for malignant neoplasm of colon: Secondary | ICD-10-CM | POA: Insufficient documentation

## 2017-04-12 ENCOUNTER — Other Ambulatory Visit: Payer: Self-pay | Admitting: Pediatrics

## 2017-05-30 ENCOUNTER — Encounter (INDEPENDENT_AMBULATORY_CARE_PROVIDER_SITE_OTHER): Payer: Self-pay | Admitting: *Deleted

## 2017-05-30 ENCOUNTER — Telehealth (INDEPENDENT_AMBULATORY_CARE_PROVIDER_SITE_OTHER): Payer: Self-pay | Admitting: *Deleted

## 2017-05-30 MED ORDER — PEG 3350-KCL-NA BICARB-NACL 420 G PO SOLR: 4000.0000 mL | Freq: Once | ORAL | 0 refills | Status: AC

## 2017-05-30 NOTE — Telephone Encounter (Signed)
Patient needs trilyte 

## 2017-06-19 ENCOUNTER — Telehealth (INDEPENDENT_AMBULATORY_CARE_PROVIDER_SITE_OTHER): Payer: Self-pay | Admitting: *Deleted

## 2017-06-19 NOTE — Telephone Encounter (Signed)
agree

## 2017-06-19 NOTE — Telephone Encounter (Signed)
Referring MD/PCP: vincent   Procedure: tcs  Reason/Indication:  screening  Has patient had this procedure before?  Yes, 10 yrs ago  If so, when, by whom and where?    Is there a family history of colon cancer?  no  Who?  What age when diagnosed?    Is patient diabetic?   no      Does patient have prosthetic heart valve or mechanical valve?  no  Do you have a pacemaker?  no  Has patient ever had endocarditis? no  Has patient had joint replacement within last 12 months?  no  Is patient constipated or take laxatives? no  Does patient have a history of alcohol/drug use?  no  Is patient on Coumadin, Plavix and/or Aspirin? yes  Medications: asa 81 mg daily, vit d 1200 mg daily  Allergies: see epic  Medication Adjustment per Dr Laural Golden: asa 2 days  Procedure date & time: 07/18/17 at 830

## 2017-06-20 ENCOUNTER — Telehealth: Payer: Self-pay | Admitting: Pediatrics

## 2017-06-20 DIAGNOSIS — Z1239 Encounter for other screening for malignant neoplasm of breast: Secondary | ICD-10-CM

## 2017-06-20 NOTE — Telephone Encounter (Signed)
That's fine to put in

## 2017-06-20 NOTE — Telephone Encounter (Signed)
Pt aware order was placed.  

## 2017-07-17 ENCOUNTER — Encounter: Payer: Self-pay | Admitting: Pediatrics

## 2017-07-17 ENCOUNTER — Ambulatory Visit (INDEPENDENT_AMBULATORY_CARE_PROVIDER_SITE_OTHER): Payer: Medicare Other | Admitting: Pediatrics

## 2017-07-17 VITALS — BP 127/85 | HR 74 | Temp 97.6°F | Resp 20 | Ht 61.0 in | Wt 179.2 lb

## 2017-07-17 DIAGNOSIS — R1013 Epigastric pain: Secondary | ICD-10-CM

## 2017-07-17 DIAGNOSIS — J069 Acute upper respiratory infection, unspecified: Secondary | ICD-10-CM

## 2017-07-17 DIAGNOSIS — R6889 Other general symptoms and signs: Secondary | ICD-10-CM | POA: Diagnosis not present

## 2017-07-17 LAB — VERITOR FLU A/B WAIVED
INFLUENZA A: NEGATIVE
INFLUENZA B: NEGATIVE

## 2017-07-17 NOTE — Progress Notes (Signed)
  Subjective:   Patient ID: Virginia Nash, female    DOB: Sep 09, 1945, 72 y.o.   MRN: [id number] CC: Cough; Nasal Congestion; and Abdominal Pain  HPI: Virginia Nash is a 72 y.o. female presenting for Cough; Nasal Congestion; and Abdominal Pain  Woke up two days ago with nasal congestion. Has had HA for past few days since symptoms started. Took some advil with some improvement. Coughing, +runny nose, sore throat.   Almost went to the hospital last night bc of epigastric abd pain. Pain took her breath away, felt like sharp stabbing. Not worse with deep breaths. Would last for 10-15 min, happened 3 times. Sitting up made it better. Felt better when sitting up in a chair so slept there last night. hasnt eaten much last few days bc of flu symptoms then yesterday bc of prep for colonoscopy scheduled for tomorrow. This morning cold symptoms bothered her, feeling back to normal self now, still congested. hasnt had anythign to eat yet today. Took advil on empty stomach yesterday.  Is usually able to walk distances such as back and forth purposefully at [employer] without SOB or chest pain.  Had to cancel colonoscopy tomorrow bc of symptoms. No nausea with abd pain, no vomiting. Normal stooling, slightly decreased. No abd pain now.     Relevant past medical, surgical, family and social history reviewed. Allergies and medications reviewed and updated. Social History   Tobacco Use  Smoking Status Never Smoker  Smokeless Tobacco Never Used   ROS: Per HPI   Objective:    BP 127/85   Pulse 74   Temp 97.6 F (36.4 C) (Oral)   Resp 20   Ht 5\' 1"  (1.549 m)   Wt 179 lb 3.2 oz (81.3 kg)   SpO2 97%   BMI 33.86 kg/m   Wt Readings from Last 3 Encounters:  07/17/17 179 lb 3.2 oz (81.3 kg)  12/25/16 177 lb 6.4 oz (80.5 kg)  12/19/16 176 lb 9.6 oz (80.1 kg)    Gen: NAD, alert, congested EYES: EOMI, no conjunctival injection, or no icterus ENT:  TMs pearly gray b/l, OP without erythema LYMPH: no  cervical LAD CV: NRRR, normal S1/S2, no murmur, distal pulses 2+ b/l Resp: CTABL, no wheezes, normal WOB Abd: +BS, soft, NTND. no guarding or organomegaly Ext: No edema, warm Neuro: Alert and oriented, strength equal b/l UE and LE, coordination grossly normal MSK: normal muscle bulk  Assessment & Plan:  Virginia Nash was seen today for cough, nasal congestion and abdominal pain.  Diagnoses and all orders for this visit:  Flu-like symptoms Neg flu -     Veritor Flu A/B Waived  Acute URI Symptom care, return precautions discussed  Epigastric pain Possible reflux. Can try OTC prn meds if needed. Avoid taking NSAIDs on empty stomach. Normal exercise tolerance. RTC if returns. S/p cholecystectomy  Follow up plan: Return in about 4 weeks (around 08/14/2017). [name], MD [place]

## 2017-07-17 NOTE — Patient Instructions (Signed)
Can try tums for epigastric pain, if not improving need to seek medical attention  Fever reducer and headache: tylenol If you take ibuprofen, take with food  Sinus pressure:  Nasal steroid such as flonase/fluticaone or nasocort daily Can also take daily antihistamine such as loratadine/claritin or cetirizine/zyrtec Decongestant such as phenylephrine, but take in the morning or may keep you awake at night  Sinus rinses/irritation: Netipot or similar with distilled water 2-3 times a day to clear out sinuses or Normal saline nasal spray  Sore throat:  Throat lozenges chloroseptic spray  Stick with bland foods Drink lots of fluids

## 2017-07-24 ENCOUNTER — Ambulatory Visit (HOSPITAL_COMMUNITY)
Admission: RE | Admit: 2017-07-24 | Discharge: 2017-07-24 | Disposition: A | Discharge status: Home / Self Care | Payer: Medicare Other | Source: Ambulatory Visit | Attending: Pediatrics | Admitting: Pediatrics

## 2017-07-24 DIAGNOSIS — Z1231 Encounter for screening mammogram for malignant neoplasm of breast: Secondary | ICD-10-CM | POA: Diagnosis not present

## 2017-07-24 DIAGNOSIS — Z1239 Encounter for other screening for malignant neoplasm of breast: Secondary | ICD-10-CM

## 2017-07-31 ENCOUNTER — Other Ambulatory Visit: Payer: Self-pay

## 2017-07-31 ENCOUNTER — Encounter (HOSPITAL_COMMUNITY)
Admission: RE | Disposition: A | Discharge status: Home / Self Care | Payer: Self-pay | Source: Ambulatory Visit | Attending: Internal Medicine

## 2017-07-31 ENCOUNTER — Ambulatory Visit (HOSPITAL_COMMUNITY)
Admission: RE | Admit: 2017-07-31 | Discharge: 2017-07-31 | Disposition: A | Discharge status: Home / Self Care | Payer: Medicare Other | Source: Ambulatory Visit | Attending: Internal Medicine | Admitting: Internal Medicine

## 2017-07-31 ENCOUNTER — Encounter (HOSPITAL_COMMUNITY): Payer: Self-pay | Admitting: *Deleted

## 2017-07-31 DIAGNOSIS — K5730 Diverticulosis of large intestine without perforation or abscess without bleeding: Secondary | ICD-10-CM | POA: Insufficient documentation

## 2017-07-31 DIAGNOSIS — Z885 Allergy status to narcotic agent status: Secondary | ICD-10-CM | POA: Insufficient documentation

## 2017-07-31 DIAGNOSIS — Z8542 Personal history of malignant neoplasm of other parts of uterus: Secondary | ICD-10-CM | POA: Insufficient documentation

## 2017-07-31 DIAGNOSIS — Z1211 Encounter for screening for malignant neoplasm of colon: Secondary | ICD-10-CM | POA: Insufficient documentation

## 2017-07-31 DIAGNOSIS — Z79899 Other long term (current) drug therapy: Secondary | ICD-10-CM | POA: Diagnosis not present

## 2017-07-31 HISTORY — PX: COLONOSCOPY: SHX5424

## 2017-07-31 SURGERY — COLONOSCOPY
Anesthesia: Moderate Sedation

## 2017-07-31 MED ORDER — MIDAZOLAM HCL 5 MG/5ML IJ SOLN
INTRAMUSCULAR | Status: AC
  Filled 2017-07-31: qty 10

## 2017-07-31 MED ORDER — MEPERIDINE HCL 50 MG/ML IJ SOLN
INTRAMUSCULAR | Status: DC
Start: 2017-07-31 — End: 2017-07-31
  Filled 2017-07-31: qty 1

## 2017-07-31 MED ORDER — MEPERIDINE HCL 50 MG/ML IJ SOLN
INTRAMUSCULAR | Status: DC | PRN
  Administered 2017-07-31 (×2): 25 mg via INTRAVENOUS

## 2017-07-31 MED ORDER — STERILE WATER FOR IRRIGATION IR SOLN
Status: DC | PRN
  Administered 2017-07-31: 12:00:00

## 2017-07-31 MED ORDER — SODIUM CHLORIDE 0.9 % IV SOLN
INTRAVENOUS | Status: DC
  Administered 2017-07-31: 11:00:00 via INTRAVENOUS

## 2017-07-31 MED ORDER — MIDAZOLAM HCL 5 MG/5ML IJ SOLN
INTRAMUSCULAR | Status: DC | PRN
  Administered 2017-07-31: 1 mg via INTRAVENOUS
  Administered 2017-07-31: 2 mg via INTRAVENOUS

## 2017-07-31 NOTE — Discharge Instructions (Signed)
Resume usual medications as before. °High-fiber diet. °No driving for 24 hours. °Next screening exam in 10 years. ° ° °Colonoscopy, Adult, Care After °This sheet gives you information about how to care for yourself after your procedure. Your doctor may also give you more specific instructions. If you have problems or questions, call your doctor. °Follow these instructions at home: °General instructions ° °· For the first 24 hours after the procedure: °? Do not drive or use machinery. °? Do not sign important documents. °? Do not drink alcohol. °? Do your daily activities more slowly than normal. °? Eat foods that are soft and easy to digest. °? Rest often. °· Take over-the-counter or prescription medicines only as told by your doctor. °· It is up to you to get the results of your procedure. Ask your doctor, or the department performing the procedure, when your results will be ready. °To help cramping and bloating: °· Try walking around. °· Put heat on your belly (abdomen) as told by your doctor. Use a heat source that your doctor recommends, such as a moist heat pack or a heating pad. °? Put a towel between your skin and the heat source. °? Leave the heat on for 20-30 minutes. °? Remove the heat if your skin turns bright red. This is especially important if you cannot feel pain, heat, or cold. You can get burned. °Eating and drinking °· Drink enough fluid to keep your pee (urine) clear or pale yellow. °· Return to your normal diet as told by your doctor. Avoid heavy or fried foods that are hard to digest. °· Avoid drinking alcohol for as long as told by your doctor. °Contact a doctor if: °· You have blood in your poop (stool) 2-3 days after the procedure. °Get help right away if: °· You have more than a small amount of blood in your poop. °· You see large clumps of tissue (blood clots) in your poop. °· Your belly is swollen. °· You feel sick to your stomach (nauseous). °· You throw up (vomit). °· You have a  fever. °· You have belly pain that gets worse, and medicine does not help your pain. °This information is not intended to replace advice given to you by your health care provider. Make sure you discuss any questions you have with your health care provider. °Document Released: 06/09/2010 Document Revised: 01/30/2016 Document Reviewed: 01/30/2016 °Elsevier Interactive Patient Education © 2017 [employer] ° °Diverticulosis °Diverticulosis is a condition that develops when small pouches (diverticula) form in the wall of the large intestine (colon). The colon is where water is absorbed and stool is formed. The pouches form when the inside layer of the colon pushes through weak spots in the outer layers of the colon. You may have a few pouches or many of them. °What are the causes? °The cause of this condition is not known. °What increases the risk? °The following factors may make you more likely to develop this condition: °· Being older than age 60. Your risk for this condition increases with age. Diverticulosis is rare among people younger than age 30. By age 80, many people have it. °· Eating a low-fiber diet. °· Having frequent constipation. °· Being overweight. °· Not getting enough exercise. °· Smoking. °· Taking over-the-counter pain medicines, like aspirin and ibuprofen. °· Having a family history of diverticulosis. ° °What are the signs or symptoms? °In most people, there are no symptoms of this condition. If you do have symptoms, they may include: °·   Bloating. °· Cramps in the abdomen. °· Constipation or diarrhea. °· Pain in the lower left side of the abdomen. ° °How is this diagnosed? °This condition is most often diagnosed during an exam for other colon problems. Because diverticulosis usually has no symptoms, it often cannot be diagnosed independently. This condition may be diagnosed by: °· Using a flexible scope to examine the colon (colonoscopy). °· Taking an X-ray of the colon after dye has been put into  the colon (barium enema). °· Doing a CT scan. ° °How is this treated? °You may not need treatment for this condition if you have never developed an infection related to diverticulosis. If you have had an infection before, treatment may include: °· Eating a high-fiber diet. This may include eating more fruits, vegetables, and grains. °· Taking a fiber supplement. °· Taking a live bacteria supplement (probiotic). °· Taking medicine to relax your colon. °· Taking antibiotic medicines. ° °Follow these instructions at home: °· Drink 6-8 glasses of water or more each day to prevent constipation. °· Try not to strain when you have a bowel movement. °· If you have had an infection before: °? Eat more fiber as directed by your health care provider or your diet and nutrition specialist (dietitian). °? Take a fiber supplement or probiotic, if your health care provider approves. °· Take over-the-counter and prescription medicines only as told by your health care provider. °· If you were prescribed an antibiotic, take it as told by your health care provider. Do not stop taking the antibiotic even if you start to feel better. °· Keep all follow-up visits as told by your health care provider. This is important. °Contact a health care provider if: °· You have pain in your abdomen. °· You have bloating. °· You have cramps. °· You have not had a bowel movement in 3 days. °Get help right away if: °· Your pain gets worse. °· Your bloating becomes very bad. °· You have a fever or chills, and your symptoms suddenly get worse. °· You vomit. °· You have bowel movements that are bloody or black. °· You have bleeding from your rectum. °Summary °· Diverticulosis is a condition that develops when small pouches (diverticula) form in the wall of the large intestine (colon). °· You may have a few pouches or many of them. °· This condition is most often diagnosed during an exam for other colon problems. °· If you have had an infection related to  diverticulosis, treatment may include increasing the fiber in your diet, taking supplements, or taking medicines. °This information is not intended to replace advice given to you by your health care provider. Make sure you discuss any questions you have with your health care provider. °Document Released: 02/02/2004 Document Revised: 03/26/2016 Document Reviewed: 03/26/2016 °Elsevier Interactive Patient Education © 2017 Elsevier Inc. ° °

## 2017-07-31 NOTE — H&P (Signed)
Virginia Nash is an 72 y.o. female.   Chief Complaint: Patient is here for colonoscopy. HPI: Patient is 72 year old Caucasian female who is here for screening colonoscopy.  Last exam was normal in September 2008.  She denies abdominal pain change in bowel habits or rectal bleeding. Family history is negative for CRC.  Past Medical History:  Diagnosis Date  . Allergy   . Arthritis   . Asthma    as child  . Biliary dyskinesia   . Blood transfusion without reported diagnosis   . Cancer ([place]) uterine; 2016   . Cataract     Past Surgical History:  Procedure Laterality Date  . ABDOMINAL HYSTERECTOMY    . CHOLECYSTECTOMY N/A 04/12/2015   Procedure: LAPAROSCOPIC CHOLECYSTECTOMY;  Surgeon: Judeth Horn, MD;  Location: [facility];  Service: General;  Laterality: N/A;  . DILATION AND CURETTAGE OF UTERUS    . EYE SURGERY     cataract with lens replacement  . TONSILLECTOMY      Family History  Problem Relation Age of Onset  . Arthritis Mother   . Cancer Mother   . Cancer Father   . Hearing loss Sister   . Stroke Maternal Grandmother   . Kidney disease Paternal Grandmother    Social History:  reports that  has never smoked. she has never used smokeless tobacco. She reports that she does not drink alcohol or use drugs.  Allergies:  Allergies  Allergen Reactions  . Codeine Nausea Only  . Peanuts [Peanut Oil] Other (See Comments)    Congestion with nasal scabs    Medications Prior to Admission  Medication Sig Dispense Refill  . acetaminophen (TYLENOL) 325 MG tablet Take 650 mg by mouth every 6 (six) hours as needed (for pain.).    Marland Kitchen Cholecalciferol (VITAMIN D-3) 5000 units TABS Take 10,000 Units by mouth 2 (two) times daily.      No results found for this or any previous visit (from the past 48 hour(s)). No results found.  ROS  Blood pressure 127/71, pulse 70, temperature 97.7 F (36.5 C), temperature source Oral, resp. rate 12, SpO2 98 %. Physical Exam   Constitutional: She appears well-developed and well-nourished.  HENT:  Mouth/Throat: Oropharynx is clear and moist.  Eyes: Conjunctivae are normal. No scleral icterus.  Neck: No thyromegaly present.  Cardiovascular: Normal rate, regular rhythm and normal heart sounds.  No murmur heard. Respiratory: Effort normal and breath sounds normal.  GI:  Abdomen is symmetrical.  She has a Pfannenstiel scar.  Abdomen is soft and nontender without organomegaly or masses.  Musculoskeletal: She exhibits no edema.  Lymphadenopathy:    She has no cervical adenopathy.  Neurological: She is alert.  Skin: Skin is warm and dry.     Assessment/Plan Average risk screening colonoscopy.  Hildred Laser, MD 07/31/2017, 11:38 AM

## 2017-07-31 NOTE — Op Note (Signed)
[facility] Patient Name: Virginia Nash Procedure Date: 07/31/2017 11:36 AM MRN: [id number] Date of Birth: 08/13/1945 Attending MD: Hildred Laser , MD CSN: [id number] Age: 72 Admit Type: Outpatient Procedure:                Colonoscopy Indications:              Screening for colorectal malignant neoplasm Providers:                Hildred Laser, MD, Hinton Rao, RN, Nelma Rothman,                            Technician Referring MD:             Berlin Hun. Evette Doffing, MD Medicines:                Meperidine 50 mg IV, Midazolam 3 mg IV Complications:            No immediate complications. Estimated Blood Loss:     Estimated blood loss: none. Procedure:                Pre-Anesthesia Assessment:                           - Prior to the procedure, a History and Physical                            was performed, and patient medications and                            allergies were reviewed. The patient's tolerance of                            previous anesthesia was also reviewed. The risks                            and benefits of the procedure and the sedation                            options and risks were discussed with the patient.                            All questions were answered, and informed consent                            was obtained. Prior Anticoagulants: The patient has                            taken aspirin, last dose was 5 days prior to                            procedure. ASA Grade Assessment: I - A normal,                            healthy patient. After reviewing the risks and  benefits, the patient was deemed in satisfactory                            condition to undergo the procedure.                           After obtaining informed consent, the colonoscope                            was passed under direct vision. Throughout the                            procedure, the patient's blood pressure, pulse, and      oxygen saturations were monitored continuously. The                            [id number] was introduced through the anus and                            advanced to the the cecum, identified by                            appendiceal orifice and ileocecal valve. The                            colonoscopy was performed without difficulty. The                            patient tolerated the procedure well. The quality                            of the bowel preparation was good. The ileocecal                            valve, appendiceal orifice, and rectum were                            photographed. Scope In: 11:48:06 AM Scope Out: 12:05:48 PM Scope Withdrawal Time: 0 hours 8 minutes 10 seconds  Total Procedure Duration: 0 hours 17 minutes 42 seconds  Findings:      The perianal and digital rectal examinations were normal.      A few diverticula were found in the sigmoid colon.      The exam was otherwise normal throughout the examined colon.      The retroflexed view of the distal rectum and anal verge was normal and       showed no anal or rectal abnormalities. Impression:               - Diverticulosis in the sigmoid colon.                           - No specimens collected. Moderate Sedation:      Moderate (conscious) sedation was administered by the endoscopy nurse       and supervised by the endoscopist. The following parameters were  monitored: oxygen saturation, heart rate, blood pressure, CO2       capnography and response to care. Total physician intraservice time was       21 minutes. Recommendation:           - Patient has a contact number available for                            emergencies. The signs and symptoms of potential                            delayed complications were discussed with the                            patient. Return to normal activities tomorrow.                            Written discharge instructions were provided to the                             patient.                           - High fiber diet today.                           - Continue present medications.                           - Repeat colonoscopy in 10 years for screening                            purposes. Procedure Code(s):        --- Professional ---                           [phone], Colonoscopy, flexible; diagnostic, including                            collection of specimen(s) by brushing or washing,                            when performed (separate procedure)                           99152, Moderate sedation services provided by the                            same physician or other qualified health care                            professional performing the diagnostic or                            therapeutic service that the sedation supports,  requiring the presence of an independent trained                            observer to assist in the monitoring of the                            patient's level of consciousness and physiological                            status; initial 15 minutes of intraservice time,                            patient age 44 years or older Diagnosis Code(s):        --- Professional ---                           Z12.11, Encounter for screening for malignant                            neoplasm of colon                           K57.30, Diverticulosis of large intestine without                            perforation or abscess without bleeding CPT copyright 2016 American Medical Association. All rights reserved. The codes documented in this report are preliminary and upon coder review may  be revised to meet current compliance requirements. Hildred Laser, MD Hildred Laser, MD 07/31/2017 12:15:11 PM This report has been signed electronically. Number of Addenda: 0

## 2017-08-06 ENCOUNTER — Encounter (HOSPITAL_COMMUNITY): Payer: Self-pay | Admitting: Internal Medicine

## 2017-08-14 ENCOUNTER — Ambulatory Visit (INDEPENDENT_AMBULATORY_CARE_PROVIDER_SITE_OTHER): Payer: Medicare Other | Admitting: Pediatrics

## 2017-08-14 ENCOUNTER — Encounter: Payer: Self-pay | Admitting: Pediatrics

## 2017-08-14 VITALS — BP 121/71 | HR 66 | Temp 97.6°F | Ht 61.0 in | Wt 183.8 lb

## 2017-08-14 DIAGNOSIS — R0683 Snoring: Secondary | ICD-10-CM | POA: Diagnosis not present

## 2017-08-14 DIAGNOSIS — R5383 Other fatigue: Secondary | ICD-10-CM

## 2017-08-14 DIAGNOSIS — R053 Chronic cough: Secondary | ICD-10-CM

## 2017-08-14 DIAGNOSIS — R05 Cough: Secondary | ICD-10-CM | POA: Diagnosis not present

## 2017-08-14 DIAGNOSIS — Z6834 Body mass index (BMI) 34.0-34.9, adult: Secondary | ICD-10-CM | POA: Diagnosis not present

## 2017-08-14 MED ORDER — PANTOPRAZOLE SODIUM 40 MG PO TBEC: 40.0000 mg | DELAYED_RELEASE_TABLET | Freq: Every day | ORAL | 3 refills | Status: DC

## 2017-08-14 NOTE — Progress Notes (Signed)
  Subjective:   Patient ID: Virginia Nash, female    DOB: Jul 01, 1945, 72 y.o.   MRN: [id number] CC: Follow-up (4 week) Multiple medical problems HPI: Virginia Nash is a 72 y.o. female presenting for Follow-up (4 week)  Cough: Ongoing for several weeks, maybe for longer.  Seen for flulike symptoms about a month ago.  She thinks it was ongoing for longer than that.  No fevers now.  Cough is nonproductive.  Cough is dry.  Appetite is been fine.  Does sometimes have reflux symptoms.  Not currently on medicine for reflux.  Elevated BMI: Trying to walk more regularly, staying active.  Watching her sugar intake.  Fatigue: Does not have as much energy as she used to.  Still with the same exercise tolerance, no shortness of breath, no chest pain with exertion.  Has a headache in the morning often when she wakes up.  She often feels like she needs to take a nap during the day.  She is not falling asleep unexpectedly.  She does snore, says it is not as loud as her husband.  Relevant past medical, surgical, family and social history reviewed. Allergies and medications reviewed and updated. Social History   Tobacco Use  Smoking Status Never Smoker  Smokeless Tobacco Never Used   ROS: Per HPI   Objective:    BP 121/71   Pulse 66   Temp 97.6 F (36.4 C) (Oral)   Ht 5\' 1"  (1.549 m)   Wt 183 lb 12.8 oz (83.4 kg)   BMI 34.73 kg/m   Wt Readings from Last 3 Encounters:  08/14/17 183 lb 12.8 oz (83.4 kg)  07/17/17 179 lb 3.2 oz (81.3 kg)  12/25/16 177 lb 6.4 oz (80.5 kg)    Gen: NAD, alert, cooperative with exam, NCAT EYES: EOMI, no conjunctival injection, or no icterus ENT:  TMs pearly gray b/l, OP without erythema LYMPH: no cervical LAD CV: NRRR, normal S1/S2, no murmur, distal pulses 2+ b/l Resp: CTABL, no wheezes, normal WOB Abd: +BS, soft, NTND. no guarding or organomegaly Ext: No edema, warm Neuro: Alert and oriented MSK: normal muscle bulk  Assessment & Plan:  Sayler was seen  today for follow-up multiple medical problems  Diagnoses and all orders for this visit:  Other fatigue -     Basic Metabolic Panel  Chronic cough Start allergy medicine, start below.  If not improving let me know. -     pantoprazole (PROTONIX) 40 MG tablet; Take 1 tablet (40 mg total) by mouth daily.  BMI 34.0-34.9,adult Continue lifestyle changes.  Snoring With morning headache, easily fatigued during the day, not well rested in the night.  Recommended sleep study to evaluate for sleep apnea.  Patient does not want right now.  Follow up plan: Return in about 4 months (around 12/14/2017). [name], MD [place]

## 2017-08-19 DIAGNOSIS — H04123 Dry eye syndrome of bilateral lacrimal glands: Secondary | ICD-10-CM | POA: Diagnosis not present

## 2017-08-19 DIAGNOSIS — Z961 Presence of intraocular lens: Secondary | ICD-10-CM | POA: Diagnosis not present

## 2017-11-11 DIAGNOSIS — M25561 Pain in right knee: Secondary | ICD-10-CM | POA: Diagnosis not present

## 2017-11-11 DIAGNOSIS — M25562 Pain in left knee: Secondary | ICD-10-CM | POA: Diagnosis not present

## 2017-12-23 ENCOUNTER — Other Ambulatory Visit: Payer: Medicare Other | Admitting: Pediatrics

## 2017-12-26 ENCOUNTER — Ambulatory Visit: Payer: Medicare Other | Admitting: *Deleted

## 2017-12-31 ENCOUNTER — Ambulatory Visit (INDEPENDENT_AMBULATORY_CARE_PROVIDER_SITE_OTHER): Payer: Medicare Other | Admitting: *Deleted

## 2017-12-31 VITALS — BP 116/80 | HR 63 | Ht 61.0 in | Wt 182.0 lb

## 2017-12-31 DIAGNOSIS — Z0000 Encounter for general adult medical examination without abnormal findings: Secondary | ICD-10-CM

## 2017-12-31 NOTE — Patient Instructions (Signed)
  Virginia Nash , Thank you for taking time to come for your Medicare Wellness Visit. I appreciate your ongoing commitment to your health goals. Please review the following plan we discussed and let me know if I can assist you in the future.   These are the goals we discussed: Goals    . Exercise 3x per week (30 min per time)     Try to exercise for 30 minutes 3 times weekly       This is a list of the screening recommended for you and due dates:  Health Maintenance  Topic Date Due  . Tetanus Vaccine  12/28/1966  . Flu Shot  12/19/2017  . Mammogram  07/25/2019  . Colon Cancer Screening  08/01/2027  . DEXA scan (bone density measurement)  Completed  .  Hepatitis C: One time screening is recommended by Center for Disease Control  (CDC) for  adults born from [birth year] through 1963.   Completed  . Pneumonia vaccines  Completed

## 2017-12-31 NOTE — Progress Notes (Addendum)
Subjective:   Virginia Nash is a 72 y.o. female who presents for Medicare Annual (Subsequent) preventive examination.  Virginia Nash lives at home with her husband Virginia Nash).  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  Virginia Nash enjoys sewing.  She eats 3 healthy meals daily that consist of lean proteins, fruits and vegetables.  She does not exercise at this time.  Virginia Nash has had no hospitalizations or surgeries in the past year and overall feels that her health is the same as a year ago.    Objective:     Vitals: BP 116/80   Pulse 63   Ht 5\' 1"  (1.549 m)   Wt 182 lb (82.6 kg)   BMI 34.39 kg/m   Body mass index is 34.39 kg/m.  Advanced Directives 12/25/2016 04/12/2015 04/05/2015  Does Patient Have a Medical Advance Directive? Yes Yes Yes  Type of Paramedic of [place];Living will - [place];Living will  Copy of [place] in Chart? No - copy requested No - copy requested Yes    Tobacco Social History   Tobacco Use  Smoking Status Never Smoker  Smokeless Tobacco Never Used     Counseling given: No   Clinical Intake:  Pre-visit preparation completed: No  Pain : No/denies pain     BMI - recorded: 34.7 Nutritional Status: BMI > 30  Obese Nutritional Risks: None Diabetes: No  What is the last grade level you completed in school?: 12th Grade, [employer]  Interpreter Needed?: No  Information entered by :: Truett Mainland, LPN  Past Medical History:  Diagnosis Date  . Allergy   . Arthritis   . Asthma    as child  . Biliary dyskinesia   . Blood transfusion without reported diagnosis   . Cancer ([place])   . Cataract    Past Surgical History:  Procedure Laterality Date  . ABDOMINAL HYSTERECTOMY    . CHOLECYSTECTOMY N/A 04/12/2015   Procedure: LAPAROSCOPIC CHOLECYSTECTOMY;  Surgeon: Judeth Horn, MD;  Location: [facility];  Service: General;   Laterality: N/A;  . COLONOSCOPY N/A 07/31/2017   Procedure: COLONOSCOPY;  Surgeon: Rogene Houston, MD;  [facility];  Service: Endoscopy;  Laterality: N/A;  830  . DILATION AND CURETTAGE OF UTERUS    . EYE SURGERY     cataract with lens replacement  . TONSILLECTOMY     Family History  Problem Relation Age of Onset  . Arthritis Mother   . Cancer Mother   . Cancer Father   . Hearing loss Sister   . Stroke Maternal Grandmother   . Kidney disease Paternal Grandmother    Social History   Socioeconomic History  . Marital status: Married    Spouse name: Virginia Nash  . Number of children: 3  . Years of education: 12th Grade  . Highest education level: High school graduate  Occupational History  . Occupation: Retired  Scientific laboratory technician  . Financial resource Nash: Not hard at all  . Food insecurity:    Worry: Never true    Inability: Never true  . Transportation needs:    Medical: No    Non-medical: No  Tobacco Use  . Smoking status: Never Smoker  . Smokeless tobacco: Never Used  Substance and Sexual Activity  . Alcohol use: No  . Drug use: No  . Sexual activity: Yes  Lifestyle  . Physical activity:  Days per week: 0 days    Minutes per session: 0 min  . Stress: Not at all  Relationships  . Social connections:    Talks on phone: More than three times a week    Gets together: More than three times a week    Attends religious service: More than 4 times per year    Active member of club or organization: Yes    Attends meetings of clubs or organizations: Never    Relationship status: Widowed  Other Topics Concern  . Not on file  Social History Narrative  . Not on file    Outpatient Encounter Medications as of 12/31/2017  Medication Sig  . Magnesium 250 MG TABS Take by mouth.  . Multiple Vitamin (MULTIVITAMIN WITH MINERALS) TABS tablet Take 1 tablet by mouth daily.  . [DISCONTINUED] cholecalciferol (VITAMIN D) 1000 units tablet Take 1,000 Units by mouth daily.    . [DISCONTINUED] pantoprazole (PROTONIX) 40 MG tablet Take 1 tablet (40 mg total) by mouth daily.   No facility-administered encounter medications on file as of 12/31/2017.     Activities of Daily Living In your present state of health, do you have any difficulty performing the following activities: 12/31/2017  Hearing? N  Vision? N  Difficulty concentrating or making decisions? N  Walking or climbing stairs? N  Dressing or bathing? N  Doing errands, shopping? Y  Comment Doesn't Drive  Some recent data might be hidden    Patient Care Team: Eustaquio Maize, MD as PCP - General (Pediatrics) Gari Crown, MD (Unknown Physician Specialty)    Assessment:   This is a routine wellness examination for Virginia Nash.  Exercise Activities and Dietary recommendations Current Exercise Habits: The patient does not participate in regular exercise at present, Exercise limited by: None identified  Goals    . Exercise 3x per week (30 min per time)     Try to exercise for 30 minutes 3 times weekly       Fall Risk Fall Risk  12/31/2017 08/14/2017 07/17/2017 12/25/2016 12/19/2016  Falls in the past year? No No No No No   Is the patient's home free of loose throw rugs in walkways, pet beds, electrical cords, etc?   yes      Grab bars in the bathroom? yes      Handrails on the stairs?   yes      Adequate lighting?   yes  Timed Get Up and Go performed:   Depression Screen PHQ 2/9 Scores 12/31/2017 08/14/2017 07/17/2017 12/25/2016  PHQ - 2 Score 0 0 0 0     Cognitive Function MMSE - Mini Mental State Exam 12/31/2017 12/25/2016  Orientation to time 5 5  Orientation to Place 5 5  Registration 3 3  Attention/ Calculation 5 5  Recall 3 3  Language- name 2 objects 2 2  Language- repeat 1 1  Language- follow 3 step command 3 3  Language- read & follow direction 1 1  Write a sentence 1 1  Copy design 1 1  Total score 30 30        Immunization History  Administered Date(s) Administered  .  Influenza-Unspecified 03/02/2015, 03/16/2016, 01/30/2017  . Pneumococcal Conjugate-13 10/21/2013  . Pneumococcal Polysaccharide-23 02/15/2015    Qualifies for Shingles Vaccine? Declined   Screening Tests Health Maintenance  Topic Date Due  . TETANUS/TDAP  12/28/1966  . INFLUENZA VACCINE  12/19/2017  . MAMMOGRAM  07/25/2019  . COLONOSCOPY  08/01/2027  . DEXA SCAN  Completed  . Hepatitis C Screening  Completed  . PNA vac Low Risk Adult  Completed  Declined Tdap due to price.  Will check at next office visit  Cancer Screenings: Lung: Low Dose CT Chest recommended if Age 66-80 years, 30 pack-year currently smoking OR have quit w/in 15years. Patient does not qualify. Breast:  Up to date on Mammogram? Yes   Up to date of Bone Density/Dexa? Yes Colorectal: up to date  Additional Screenings:  Hepatitis C Screening:      Plan:  Encouraged Virginia Nash to try to exercise for at least 30 minutes, 3 times weekly.  Encouraged to continue to eat 3 healthy meals daily.  Tdap information given, declined due to price, will check on price at next office visit with PCP.  Encouraged to keep follow up appointment  I have personally reviewed and noted the following in the patient's chart:   . Medical and social history . Use of alcohol, tobacco or illicit drugs  . Current medications and supplements . Functional ability and status . Nutritional status . Physical activity . Advanced directives . List of other physicians . Hospitalizations, surgeries, and ER visits in previous 12 months . Vitals . Screenings to include cognitive, depression, and falls . Referrals and appointments  In addition, I have reviewed and discussed with patient certain preventive protocols, quality metrics, and best practice recommendations. A written personalized care plan for preventive services as well as general preventive health recommendations were provided to patient.     Wardell Heath,  LPN  7/85/8850    I have reviewed and agree with the above AWV documentation.  Claretta Fraise, M.D.

## 2018-01-16 ENCOUNTER — Encounter: Payer: Self-pay | Admitting: Pediatrics

## 2018-01-16 ENCOUNTER — Ambulatory Visit (INDEPENDENT_AMBULATORY_CARE_PROVIDER_SITE_OTHER): Payer: Medicare Other | Admitting: Pediatrics

## 2018-01-16 ENCOUNTER — Ambulatory Visit (INDEPENDENT_AMBULATORY_CARE_PROVIDER_SITE_OTHER): Payer: Medicare Other

## 2018-01-16 VITALS — BP 136/75 | HR 60 | Temp 97.1°F | Ht 61.0 in | Wt 181.8 lb

## 2018-01-16 DIAGNOSIS — R05 Cough: Secondary | ICD-10-CM

## 2018-01-16 DIAGNOSIS — R5383 Other fatigue: Secondary | ICD-10-CM | POA: Diagnosis not present

## 2018-01-16 DIAGNOSIS — R059 Cough, unspecified: Secondary | ICD-10-CM

## 2018-01-16 DIAGNOSIS — L819 Disorder of pigmentation, unspecified: Secondary | ICD-10-CM | POA: Diagnosis not present

## 2018-01-16 DIAGNOSIS — L821 Other seborrheic keratosis: Secondary | ICD-10-CM | POA: Diagnosis not present

## 2018-01-16 DIAGNOSIS — L570 Actinic keratosis: Secondary | ICD-10-CM

## 2018-01-16 NOTE — Progress Notes (Signed)
Subjective:   Patient ID: Virginia Nash, female    DOB: 18-Nov-1945, 72 y.o.   MRN: [id number] CC: Medical Management of Chronic Issues  HPI: Virginia Nash is a 72 y.o. female   Cough: Happening at some point most days.  Sometimes she feels like she swallows funny.  Sometimes it comes out of the blue.  Nothing seems to make it better or worse.  Nonproductive.  Dry.  Does not feel like she is been congested.  She is treated with PPI for possible reflux last visit, she took it for 4 days but it made her feel sick so she stopped.  Fatigue: Getting into bed between 10 and 11pm.  Waking up between 730 and 830.  Sometimes takes her an hour to fall asleep.  Waking up some mornings with a headache.  She is not sure if she snores at night or not.  She is never been tested for sleep apnea before.  Her husband is been going through cancer treatment which has been somewhat stressful.  She think she will be sleeping better once they hear back about how he is doing in a couple weeks.  She has a mole on her neck is been growing, she would like removed.  New rough place on her left temple.  Relevant past medical, surgical, family and social history reviewed. Allergies and medications reviewed and updated. Social History   Tobacco Use  Smoking Status Never Smoker  Smokeless Tobacco Never Used   ROS: Per HPI   Objective:    BP 136/75   Pulse 60   Temp (!) 97.1 F (36.2 C) (Oral)   Ht '5\' 1"'$  (1.549 m)   Wt 181 lb 12.8 oz (82.5 kg)   BMI 34.35 kg/m   Wt Readings from Last 3 Encounters:  01/16/18 181 lb 12.8 oz (82.5 kg)  12/31/17 182 lb (82.6 kg)  08/14/17 183 lb 12.8 oz (83.4 kg)    Gen: NAD, alert, cooperative with exam, NCAT EYES: EOMI, no conjunctival injection, or no icterus ENT:  TMs dull pink-yellow b/l, OP without erythema LYMPH: no cervical LAD CV: NRRR, normal S1/S2, no murmur, distal pulses 2+ b/l Resp: CTABL, no wheezes, normal WOB Abd: +BS, soft, NTND.  Ext: No edema,  warm Neuro: Alert and oriented, strength equal b/l UE and LE, coordination grossly normal Skin: rough <82m papule L temple. 362mbrown pedunculated papule L lower neck  Assessment & Plan:  Ly[place]as seen today for medical management of chronic issues.  Diagnoses and all orders for this visit:  Other fatigue Morning headaches, discussed sleep apnea as possible cause of symptoms, importance of treating if she does have sleep apnea. Pt does not want to do sleep study now. -     CBC with Differential/Platelet -     CMP14+EGFR -     TSH  Cough Start claritin daily. Will get CXR given length of time ongoing. If not improving consider referral. -     DG Chest 2 View; Future  AK (actinic keratosis) Discussed options, will treat with cryotherapy.  Discussed risks, benefits and alternatives, pt agreed to proceed. Liquid nitrogen used to freeze 1 AK on L temple.     Pigmented skin lesion of uncertain nature -     Pathology  After risks and benefits and procedure itself discussed in detail, pt agreed to proceed with biopsy of nevus L side of neck. Area was thoroughly cleaned and prepped with betadine. 0.31m88mf 1% lidocaine with epi was  injected under the the lesion. Using a dermablade, the lesion was removed, hemostasis achieved with pressure. Wound dressed, wound care discussed with pt.   Follow up plan: Return in about 3 months (around 04/18/2018). [name], MD [place]

## 2018-01-16 NOTE — Patient Instructions (Signed)
Back Exercises If you have pain in your back, do these exercises 2-3 times each day or as told by your doctor. When the pain goes away, do the exercises once each day, but repeat the steps more times for each exercise (do more repetitions). If you do not have pain in your back, do these exercises once each day or as told by your doctor. Exercises Single Knee to Chest  Do these steps 3-5 times in a row for each leg: 1. Lie on your back on a firm bed or the floor with your legs stretched out. 2. Bring one knee to your chest. 3. Hold your knee to your chest by grabbing your knee or thigh. 4. Pull on your knee until you feel a gentle stretch in your lower back. 5. Keep doing the stretch for 10-30 seconds. 6. Slowly let go of your leg and straighten it.  Pelvic Tilt  Do these steps 5-10 times in a row: 1. Lie on your back on a firm bed or the floor with your legs stretched out. 2. Bend your knees so they point up to the ceiling. Your feet should be flat on the floor. 3. Tighten your lower belly (abdomen) muscles to press your lower back against the floor. This will make your tailbone point up to the ceiling instead of pointing down to your feet or the floor. 4. Stay in this position for 5-10 seconds while you gently tighten your muscles and breathe evenly.  Cat-Cow  Do these steps until your lower back bends more easily: 1. Get on your hands and knees on a firm surface. Keep your hands under your shoulders, and keep your knees under your hips. You may put padding under your knees. 2. Let your head hang down, and make your tailbone point down to the floor so your lower back is round like the back of a cat. 3. Stay in this position for 5 seconds. 4. Slowly lift your head and make your tailbone point up to the ceiling so your back hangs low (sags) like the back of a cow. 5. Stay in this position for 5 seconds.  Press-Ups  Do these steps 5-10 times in a row: 1. Lie on your belly (face-down)  on the floor. 2. Place your hands near your head, about shoulder-width apart. 3. While you keep your back relaxed and keep your hips on the floor, slowly straighten your arms to raise the top half of your body and lift your shoulders. Do not use your back muscles. To make yourself more comfortable, you may change where you place your hands. 4. Stay in this position for 5 seconds. 5. Slowly return to lying flat on the floor.  Bridges  Do these steps 10 times in a row: 1. Lie on your back on a firm surface. 2. Bend your knees so they point up to the ceiling. Your feet should be flat on the floor. 3. Tighten your butt muscles and lift your butt off of the floor until your waist is almost as high as your knees. If you do not feel the muscles working in your butt and the back of your thighs, slide your feet 1-2 inches farther away from your butt. 4. Stay in this position for 3-5 seconds. 5. Slowly lower your butt to the floor, and let your butt muscles relax.  If this exercise is too easy, try doing it with your arms crossed over your chest. Back Lifts Do these steps 5-10 times in a   row: 1. Lie on your belly (face-down) with your arms at your sides, and rest your forehead on the floor. 2. Tighten the muscles in your legs and your butt. 3. Slowly lift your chest off of the floor while you keep your hips on the floor. Keep the back of your head in line with the curve in your back. Look at the floor while you do this. 4. Stay in this position for 3-5 seconds. 5. Slowly lower your chest and your face to the floor.  Contact a doctor if:  Your back pain gets a lot worse when you do an exercise.  Your back pain does not lessen 2 hours after you exercise. If you have any of these problems, stop doing the exercises. Do not do them again unless your doctor says it is okay. Get help right away if:  You have sudden, very bad back pain. If this happens, stop doing the exercises. Do not do them again  unless your doctor says it is okay. This information is not intended to replace advice given to you by your health care provider. Make sure you discuss any questions you have with your health care provider. Document Released: 06/09/2010 Document Revised: 10/13/2015 Document Reviewed: 07/01/2014 Elsevier Interactive Patient Education  2018 [employer]   

## 2018-01-17 LAB — CBC WITH DIFFERENTIAL/PLATELET
BASOS ABS: 0.1 10*3/uL (ref 0.0–0.2)
BASOS: 1 %
EOS (ABSOLUTE): 0.2 10*3/uL (ref 0.0–0.4)
Eos: 2 %
Hematocrit: 41.3 % (ref 34.0–46.6)
Hemoglobin: 14 g/dL (ref 11.1–15.9)
IMMATURE GRANULOCYTES: 0 %
Immature Grans (Abs): 0 10*3/uL (ref 0.0–0.1)
LYMPHS: 39 %
Lymphocytes Absolute: 2.6 10*3/uL (ref 0.7–3.1)
MCH: 31.7 pg (ref 26.6–33.0)
MCHC: 33.9 g/dL (ref 31.5–35.7)
MCV: 93 fL (ref 79–97)
MONOS ABS: 0.7 10*3/uL (ref 0.1–0.9)
Monocytes: 10 %
NEUTROS PCT: 48 %
Neutrophils Absolute: 3.2 10*3/uL (ref 1.4–7.0)
Platelets: 209 10*3/uL (ref 150–450)
RBC: 4.42 x10E6/uL (ref 3.77–5.28)
RDW: 13 % (ref 12.3–15.4)
WBC: 6.7 10*3/uL (ref 3.4–10.8)

## 2018-01-17 LAB — CMP14+EGFR
A/G RATIO: 1.6 (ref 1.2–2.2)
ALT: 17 IU/L (ref 0–32)
AST: 14 IU/L (ref 0–40)
Albumin: 4.3 g/dL (ref 3.5–4.8)
Alkaline Phosphatase: 114 IU/L (ref 39–117)
BILIRUBIN TOTAL: 0.7 mg/dL (ref 0.0–1.2)
BUN/Creatinine Ratio: 21 (ref 12–28)
BUN: 17 mg/dL (ref 8–27)
CALCIUM: 9.3 mg/dL (ref 8.7–10.3)
CHLORIDE: 102 mmol/L (ref 96–106)
CO2: 27 mmol/L (ref 20–29)
Creatinine, Ser: 0.81 mg/dL (ref 0.57–1.00)
GFR calc Af Amer: 85 mL/min/{1.73_m2} (ref >59)
GFR calc non Af Amer: 74 mL/min/{1.73_m2} (ref >59)
GLUCOSE: 100 mg/dL — AB (ref 65–99)
Globulin, Total: 2.7 g/dL (ref 1.5–4.5)
POTASSIUM: 4.4 mmol/L (ref 3.5–5.2)
Sodium: 142 mmol/L (ref 134–144)
Total Protein: 7 g/dL (ref 6.0–8.5)

## 2018-01-17 LAB — TSH: TSH: 2.77 u[IU]/mL (ref 0.450–4.500)

## 2018-01-21 LAB — PATHOLOGY

## 2018-02-17 DIAGNOSIS — Z23 Encounter for immunization: Secondary | ICD-10-CM | POA: Diagnosis not present

## 2018-04-07 ENCOUNTER — Ambulatory Visit (INDEPENDENT_AMBULATORY_CARE_PROVIDER_SITE_OTHER): Payer: Medicare Other | Admitting: Nurse Practitioner

## 2018-04-07 ENCOUNTER — Telehealth: Payer: Self-pay | Admitting: Pediatrics

## 2018-04-07 ENCOUNTER — Encounter: Payer: Self-pay | Admitting: Nurse Practitioner

## 2018-04-07 VITALS — BP 135/73 | HR 64 | Temp 97.5°F | Ht 61.0 in | Wt 189.0 lb

## 2018-04-07 DIAGNOSIS — J069 Acute upper respiratory infection, unspecified: Secondary | ICD-10-CM

## 2018-04-07 DIAGNOSIS — B9789 Other viral agents as the cause of diseases classified elsewhere: Secondary | ICD-10-CM | POA: Diagnosis not present

## 2018-04-07 DIAGNOSIS — M542 Cervicalgia: Secondary | ICD-10-CM

## 2018-04-07 MED ORDER — CYCLOBENZAPRINE HCL 5 MG PO TABS: 5.0000 mg | ORAL_TABLET | Freq: Three times a day (TID) | ORAL | 0 refills | Status: DC | PRN

## 2018-04-07 MED ORDER — NAPROXEN 500 MG PO TABS: 500.0000 mg | ORAL_TABLET | Freq: Two times a day (BID) | ORAL | 1 refills | Status: DC

## 2018-04-07 NOTE — Patient Instructions (Signed)

## 2018-04-07 NOTE — Telephone Encounter (Signed)
Patient thought you had mentioned to her you were going to make a referral for her to have an echocardiogram. I looked in your notes from 08/14/17 to current and did not see any note of this. She has a follow up appointment with you next month.  Please advise.

## 2018-04-07 NOTE — Progress Notes (Signed)
Subjective:    Patient ID: Virginia Nash, female    DOB: [birth year], 72 y.o.   MRN: [id number]   Chief Complaint: Cough; Nasal Congestion; and Shoulder Pain (Left)   HPI Patient comes in today c/o - cough and congestion. Started Saturday afternoon. She has taken aleve but nothing else. Cough is worse today then it was when it started. - left neck pain- said she woke up with it hurting this morning. Thinks she slept on it funny. She applied heat to it this morning which did not help at all.   Review of Systems  Constitutional: Positive for chills. Negative for fever.  HENT: Positive for congestion and postnasal drip. Negative for ear pain, sore throat, trouble swallowing and voice change.   Respiratory: Positive for cough (nonproductive). Negative for shortness of breath.   Cardiovascular: Negative.   Gastrointestinal: Negative.   Genitourinary: Negative.   Musculoskeletal: Positive for arthralgias (left sholder pain.).  Neurological: Negative.   Psychiatric/Behavioral: Negative.        Objective:   Physical Exam  Constitutional: She is oriented to person, place, and time. She appears well-developed and well-nourished.  HENT:  Right Ear: External ear normal.  Left Ear: External ear normal.  Nose: Nose normal.  Mouth/Throat: Oropharynx is clear and moist.  Eyes: Pupils are equal, round, and reactive to light. EOM are normal.  Neck: Normal range of motion. Neck supple.  Cardiovascular: Normal rate.  Pulmonary/Chest: Effort normal and breath sounds normal.  Deep dry cough  Musculoskeletal:  FROM of left shoulder without pain Pain on rotation of cervical spine to the right as well as pain with tilting of neck to left. Grips equal bil  Neurological: She is alert and oriented to person, place, and time.  Skin: Skin is warm and dry.  Psychiatric: She has a normal mood and affect. Her behavior is normal. Thought content normal.  Nursing note and vitals reviewed.  BP 135/73    Pulse 64   Temp (!) 97.5 F (36.4 C) (Oral)   Ht 5\' 1"  (1.549 m)   Wt 189 lb (85.7 kg)   BMI 35.71 kg/m         Assessment & Plan:  Virginia Nash in today with chief complaint of Cough; Nasal Congestion; and Shoulder Pain (Left)   1. Viral upper respiratory tract infection with cough 1. Take meds as prescribed 2. Use a cool mist humidifier especially during the winter months and when heat has been humid. 3. Use saline nose sprays frequently 4. Saline irrigations of the nose can be very helpful if done frequently.  * 4X daily for 1 week*  * Use of a nettie pot can be helpful with this. Follow directions with this* 5. Drink plenty of fluids 6. Keep thermostat turn down low 7.For any cough or congestion  Use plain Mucinex- regular strength or max strength is fine   * Children- consult with Pharmacist for dosing 8. For fever or aces or pains- take tylenol or ibuprofen appropriate for age and weight.  * for fevers greater than 101 orally you may alternate ibuprofen and tylenol every  3 hours.     2. Neck pain on left side Moist heat rest - naproxen (NAPROSYN) 500 MG tablet; Take 1 tablet (500 mg total) by mouth 2 (two) times daily with a meal.  Dispense: 60 tablet; Refill: 1 - cyclobenzaprine (FLEXERIL) 5 MG tablet; Take 1 tablet (5 mg total) by mouth 3 (three) times daily as needed for muscle  spasms.  Dispense: 20 tablet; Refill: 0   Mary-Margaret Hassell Done, FNP

## 2018-04-16 DIAGNOSIS — Z6836 Body mass index (BMI) 36.0-36.9, adult: Secondary | ICD-10-CM | POA: Diagnosis not present

## 2018-04-16 DIAGNOSIS — Z01419 Encounter for gynecological examination (general) (routine) without abnormal findings: Secondary | ICD-10-CM | POA: Diagnosis not present

## 2018-04-16 DIAGNOSIS — Z1272 Encounter for screening for malignant neoplasm of vagina: Secondary | ICD-10-CM | POA: Diagnosis not present

## 2018-04-23 ENCOUNTER — Ambulatory Visit (INDEPENDENT_AMBULATORY_CARE_PROVIDER_SITE_OTHER): Payer: Medicare Other | Admitting: Pediatrics

## 2018-04-23 ENCOUNTER — Encounter: Payer: Self-pay | Admitting: Pediatrics

## 2018-04-23 VITALS — BP 122/76 | HR 69 | Temp 97.5°F | Ht 61.0 in | Wt 190.8 lb

## 2018-04-23 DIAGNOSIS — K219 Gastro-esophageal reflux disease without esophagitis: Secondary | ICD-10-CM

## 2018-04-23 DIAGNOSIS — F439 Reaction to severe stress, unspecified: Secondary | ICD-10-CM

## 2018-04-23 DIAGNOSIS — R0602 Shortness of breath: Secondary | ICD-10-CM

## 2018-04-23 MED ORDER — FAMOTIDINE 20 MG PO TABS: 20.0000 mg | ORAL_TABLET | Freq: Two times a day (BID) | ORAL | 2 refills | Status: DC

## 2018-04-23 NOTE — Progress Notes (Signed)
  Subjective:   Patient ID: Virginia Nash, female    DOB: [birth year], 72 y.o.   MRN: [id number] CC: Medical Management of Chronic Issues (3 month)  HPI: Virginia Nash is a 72 y.o. female   Has been feeling well overall since last visit.  Continues to feel tired during the day.  Husband has been sick, and she worries a lot about his health problems.  She says she is trying not to let him know that when she is stressed.  She says he feels like she is always running around doing things at home.  She feels like she is not able to do as much as she used to be able to do.  Sometimes will feel overly tired, such as right after she takes a shower and gets dressed in the morning.  Sometimes she has to take a deep breath, catch her breath.  No fevers.  No cough.  No chest pressure or chest pain with exertion.  She thinks her mood has been doing all right overall.  Continues to have intermittent daytime fatigue.  Sleeping about 7 hours a night.  She has had acid reflux symptoms more regularly.  Taking magnesium at night has helped a lot with the leg cramps.  Relevant past medical, surgical, family and social history reviewed. Allergies and medications reviewed and updated. Social History   Tobacco Use  Smoking Status Never Smoker  Smokeless Tobacco Never Used   ROS: Per HPI   Objective:    BP 122/76   Pulse 69   Temp (!) 97.5 F (36.4 C) (Oral)   Ht 5\' 1"  (1.549 m)   Wt 190 lb 12.8 oz (86.5 kg)   BMI 36.05 kg/m   Wt Readings from Last 3 Encounters:  04/23/18 190 lb 12.8 oz (86.5 kg)  04/07/18 189 lb (85.7 kg)  01/16/18 181 lb 12.8 oz (82.5 kg)    O2 level 100% at rest on room air.  Walking briskly for 1 minute, ranged 98 to 100%.  Gen: NAD, alert, cooperative with exam, NCAT EYES: EOMI, no conjunctival injection, or no icterus ENT: OP without erythema LYMPH: no cervical LAD CV: NRRR, normal S1/S2, no murmur, distal pulses 2+ b/l Resp: CTABL, no wheezes, normal WOB Abd: +BS,  soft, NTND.  Ext: No edema, warm Neuro: Alert and oriented, strength equal b/l UE and LE, coordination grossly normal MSK: normal muscle bulk Psych: Normal affect, tearful at times.  Assessment & Plan:  Virginia Nash was seen today for medical management of chronic issues.  Diagnoses and all orders for this visit:  Gastroesophageal reflux disease, esophagitis presence not specified Trial of below for 2 months, then wean off. -     famotidine (PEPCID) 20 MG tablet; Take 1 tablet (20 mg total) by mouth 2 (two) times daily.  Stress at home Support given.  Resources discussed.  Will let me know if any worsening in mood or anxiety.  Shortness of breath Intermittent. Normal oxygen levels with walking.  Also with intermittent daytime fatigue.  Significant stress at home with husband's illness.  No risk factors other than age for CAD, no history of hypertension, hyperlipidemia, diabetes. Will continue to follow symptoms.  Consider referral for sleep apnea evaluation versus cardiology for stress test.  Let me know if any worsening.  Follow up plan: Return in about 2 months (around 06/24/2018). [name], MD [place]

## 2018-04-23 NOTE — Patient Instructions (Addendum)
[facility] Contact: [phone]                                                                              Food Choices for Gastroesophageal Reflux Disease, Adult When you have gastroesophageal reflux disease (GERD), the foods you eat and your eating habits are very important. Choosing the right foods can help ease your discomfort. What guidelines do I need to follow?  Choose fruits, vegetables, whole grains, and low-fat dairy products.  Choose low-fat meat, fish, and poultry.  Limit fats such as oils, salad dressings, butter, nuts, and avocado.  Keep a food diary. This helps you identify foods that cause symptoms.  Avoid foods that cause symptoms. These may be different for everyone.  Eat small meals often instead of 3 large meals a day.  Eat your meals slowly, in a place where you are relaxed.  Limit fried foods.  Cook foods using methods other than frying.  Avoid drinking alcohol.  Avoid drinking large amounts of liquids with your meals.  Avoid bending over or lying down until 2-3 hours after eating. What foods are not recommended? These are some foods and drinks that may make your symptoms worse: Vegetables Tomatoes. Tomato juice. Tomato and spaghetti sauce. Chili peppers. Onion and garlic. Horseradish. Fruits Oranges, grapefruit, and lemon (fruit and juice). Meats High-fat meats, fish, and poultry. This includes hot dogs, ribs, ham, sausage, salami, and bacon. Dairy Whole milk and chocolate milk. Sour cream. Cream. Butter. Ice cream. Cream cheese. Drinks Coffee and tea. Bubbly (carbonated) drinks or energy drinks. Condiments Hot sauce. Barbecue sauce. Sweets/Desserts Chocolate and cocoa. Donuts. Peppermint and spearmint. Fats and Oils High-fat foods. This includes [place] fries and potato chips. Other Vinegar. Strong spices. This includes black pepper, white pepper, red pepper, cayenne, curry powder, cloves, ginger, and chili powder. The items  listed above may not be a complete list of foods and drinks to avoid. Contact your dietitian for more information. This information is not intended to replace advice given to you by your health care provider. Make sure you discuss any questions you have with your health care provider. Document Released: 11/06/2011 Document Revised: 10/13/2015 Document Reviewed: 03/11/2013 Elsevier Interactive Patient Education  2017 [employer].

## 2018-04-24 NOTE — Telephone Encounter (Signed)
D/w pt at appt.

## 2018-07-09 ENCOUNTER — Encounter: Payer: Self-pay | Admitting: *Deleted

## 2018-07-11 ENCOUNTER — Encounter: Payer: Self-pay | Admitting: Family Medicine

## 2018-07-11 ENCOUNTER — Ambulatory Visit (INDEPENDENT_AMBULATORY_CARE_PROVIDER_SITE_OTHER): Payer: Medicare Other | Admitting: Family Medicine

## 2018-07-11 ENCOUNTER — Ambulatory Visit (INDEPENDENT_AMBULATORY_CARE_PROVIDER_SITE_OTHER): Payer: Medicare Other

## 2018-07-11 ENCOUNTER — Ambulatory Visit: Payer: Medicare Other | Admitting: Pediatrics

## 2018-07-11 VITALS — BP 143/79 | HR 64 | Temp 96.9°F | Ht 61.0 in | Wt 192.0 lb

## 2018-07-11 DIAGNOSIS — R058 Other specified cough: Secondary | ICD-10-CM

## 2018-07-11 DIAGNOSIS — Z1329 Encounter for screening for other suspected endocrine disorder: Secondary | ICD-10-CM

## 2018-07-11 DIAGNOSIS — G500 Trigeminal neuralgia: Secondary | ICD-10-CM | POA: Insufficient documentation

## 2018-07-11 DIAGNOSIS — R197 Diarrhea, unspecified: Secondary | ICD-10-CM | POA: Diagnosis not present

## 2018-07-11 DIAGNOSIS — R05 Cough: Secondary | ICD-10-CM

## 2018-07-11 DIAGNOSIS — E669 Obesity, unspecified: Secondary | ICD-10-CM

## 2018-07-11 DIAGNOSIS — R0602 Shortness of breath: Secondary | ICD-10-CM

## 2018-07-11 NOTE — Patient Instructions (Signed)
I would like you to take the Pepcid twice daily for 2 weeks and see if the cough/ shortness of breath improves.  We are checking a chest xray today as well for completion.  If symptoms do not get better, we can refer to the pulmonologist for further evaluation  We discussed that the nerve pain can be treated with neurontin if symptoms are more prolonged/ severe.  You had labs performed today.  You will be contacted with the results of the labs once they are available, usually in the next 3 business days for routine lab work.    Trigeminal Neuralgia  Trigeminal neuralgia is a nerve disorder that causes attacks of severe facial pain. The attacks last from a few seconds to several minutes. They can happen for days, weeks, or months and then go away for months or years. Trigeminal neuralgia is also called tic douloureux. What are the causes? This condition is caused by damage to a nerve in the face that is called the trigeminal nerve. An attack can be triggered by:  Talking.  Chewing.  Putting on makeup.  Washing your face.  Shaving your face.  Brushing your teeth.  Touching your face. What increases the risk? This condition is more likely to develop in:  Women.  People who are 25 years of age or older. What are the signs or symptoms? The main symptom of this condition is pain in the jaw, lips, eyes, nose, scalp, forehead, and face. The pain may be intense, stabbing, electric, or shock-like. How is this diagnosed? This condition is diagnosed with a physical exam. A CT scan or MRI may be done to rule out other conditions that can cause facial pain. How is this treated? This condition may be treated with:  Avoiding the things that trigger your attacks.  Pain medicine.  Surgery. This may be done in severe cases if other medical treatment does not provide relief. Follow these instructions at home:  Take over-the-counter and prescription medicines only as told by your health  care provider.  If you wish to get pregnant, talk with your health care provider before you start trying to get pregnant.  Avoid the things that trigger your attacks. It may help to: ? Chew on the unaffected side of your mouth. ? Avoid touching your face. ? Avoid blasts of hot or cold air. Contact a health care provider if:  Your pain medicine is not helping.  You develop new, unexplained symptoms, such as: ? Double vision. ? Facial weakness. ? Changes in hearing or balance.  You become pregnant. Get help right away if:  Your pain is unbearable, and your pain medicine does not help. This information is not intended to replace advice given to you by your health care provider. Make sure you discuss any questions you have with your health care provider. Document Released: 05/04/2000 Document Revised: 04/05/2017 Document Reviewed: 08/30/2014 Elsevier Interactive Patient Education  2019 [employer].

## 2018-07-11 NOTE — Progress Notes (Signed)
Subjective: CC: Follow-up GERD PCP: [name], DO [facility]:LEXNT C Remmers is a 73 y.o. female presenting to clinic today for:  1.  GERD Patient reports that she was started on Pepcid twice daily in December for acid reflux symptoms.  She thought that the symptoms are secondary to a tea that she had been drinking and she started drinking less of it and noticed improvement angered.  She uses over-the-counter acid reflux chewables as needed but has totally stopped the Pepcid.  She denies any hematochezia, melena, nausea or vomiting but does occasionally have diarrhea.  She thinks that this may be related to an easily upset stomach from gallbladder surgery.  2.  Weight gain Patient reports difficulty with weight loss and reports an overall weight gain since having gallbladder surgery.  She reports some diarrhea as above that is nonbloody.  She does sometimes have what she feels is hyperactivity but other times she feels short of breath with exertion.  This is been ongoing for the last couple of months.  She also reports a cough over the last couple of months that is nonproductive.  No hemoptysis or fevers.  The shortness of breath on exertion that she reports can be with dings as simple as bathing.  This is a change from her baseline as she used to workout regularly.  She reports consumption of small meals multiple times daily that are generally low in calorie.  However, she does not workout regularly and has an overall low activity level.  3.  Facial pain Patient reports left-sided facial pain, particularly along the left lower jaw that occurs intermittently lasting 1 to 2 minutes at a time.  She thinks this is trigeminal neuralgia, citing that her mother had a similar diagnosis.  Symptoms have been ongoing for over a year.  No medications taken for it because it does not last long enough.   ROS: Per HPI  Allergies  Allergen Reactions  . Codeine Nausea Only  . Peanut Oil Other (See  Comments) and Rash    Congestion with nasal scabs Congestion with nasal scabs    Past Medical History:  Diagnosis Date  . Allergy   . Arthritis   . Asthma    as child  . Biliary dyskinesia   . Blood transfusion without reported diagnosis   . Cancer ([place])   . Cataract     Current Outpatient Medications:  Marland Kitchen  Magnesium 250 MG TABS, Take by mouth., Disp: , Rfl:  .  Multiple Vitamin (MULTIVITAMIN WITH MINERALS) TABS tablet, Take 1 tablet by mouth daily., Disp: , Rfl:  Social History   Socioeconomic History  . Marital status: Married    Spouse name: [name]  . Number of children: 3  . Years of education: 12th Grade  . Highest education level: High school graduate  Occupational History  . Occupation: Retired  Scientific laboratory technician  . Financial resource strain: Not hard at all  . Food insecurity:    Worry: Never true    Inability: Never true  . Transportation needs:    Medical: No    Non-medical: No  Tobacco Use  . Smoking status: Never Smoker  . Smokeless tobacco: Never Used  Substance and Sexual Activity  . Alcohol use: No  . Drug use: No  . Sexual activity: Yes  Lifestyle  . Physical activity:    Days per week: 0 days    Minutes per session: 0 min  . Stress: Not at all  Relationships  . Social  connections:    Talks on phone: More than three times a week    Gets together: More than three times a week    Attends religious service: More than 4 times per year    Active member of club or organization: Yes    Attends meetings of clubs or organizations: Never    Relationship status: Widowed  . Intimate partner violence:    Fear of current or ex partner: No    Emotionally abused: No    Physically abused: No    Forced sexual activity: No  Other Topics Concern  . Not on file  Social History Narrative  . Not on file   Family History  Problem Relation Age of Onset  . Arthritis Mother   . Cancer Mother   . Cancer Father   . Hearing loss Sister   . Stroke Maternal  Grandmother   . Kidney disease Paternal Grandmother     Objective: Office vital signs reviewed. BP (!) 143/79   Pulse 64   Temp (!) 96.9 F (36.1 C) (Oral)   Ht '5\' 1"'$  (1.549 m)   Wt 192 lb (87.1 kg)   BMI 36.28 kg/m   Physical Examination:  General: Awake, alert, well nourished, No acute distress HEENT: Normal, sclera white, MMM, EOMI, Left TM intact, no evidence of injury or infection Cardio: regular rate and rhythm, S1S2 heard, no murmurs appreciated Pulm: clear to auscultation bilaterally, no wheezes, rhonchi or rales; normal work of breathing on room air GI: soft, non-tender, non-distended, bowel sounds present x4, no hepatomegaly, no splenomegaly, no masses Extremities: warm, well perfused, No edema, cyanosis or clubbing; +2 pulses bilaterally MSK: normal gait and station Skin: dry; intact; no rashes or lesions Neuro: facial nerve and trigeminal nerve appear to be in tact.  Dg Chest 2 View  Result Date: 07/11/2018 CLINICAL DATA:  Shortness of breath with exertion, cough for several weeks, history childhood asthma EXAM: CHEST - 2 VIEW COMPARISON:  01/16/2018 FINDINGS: Upper normal heart size. Mediastinal contours and pulmonary vascularity normal. Atherosclerotic calcification aorta. Calcified granuloma RIGHT base. Minimal chronic central peribronchial thickening. No acute infiltrate, pleural effusion or pneumothorax. Tiny endplate spurs thoracic spine. IMPRESSION: No acute abnormalities. Electronically Signed   By: Lavonia Dana M.D.   On: 07/11/2018 08:55    Assessment/ Plan: 73 y.o. female   1. Cough present for greater than 3 weeks No acute findings. There is a calcified granuloma, which was present for quite some time.  She has hx granulomatous infection in the past. Plan for 2-week trial of regular use of Pepcid and possible referral to pulmonology if symptoms not improving. - DG Chest 2 View; Future - CBC  2. Shortness of breath on exertion ? Undiagnosed lung disease  vs deconditioning. No evidence of cardiac etiology at this time. - DG Chest 2 View; Future - TSH  3. Obesity (BMI 35.0-39.9 without comorbidity) - CMP14+EGFR - Lipid Panel - TSH  4. Screening for thyroid disorder - TSH  5. Diarrhea, unspecified type ?metabolic disorder vs secondary to gallbladder removal - TSH  6. Trigeminal neuralgia of left side of face Intermittent and not sustained.  Plan for MRI if persistent/ worsening and Neurontin for treatment if needed.   Orders Placed This Encounter  Procedures  . DG Chest 2 View    Standing Status:   Future    Number of Occurrences:   1    Standing Expiration Date:   09/09/2019    Order Specific Question:   Reason  for Exam (SYMPTOM  OR DIAGNOSIS REQUIRED)    Answer:   intermittent shortness of breath, cough x2 months    Order Specific Question:   Preferred imaging location?    Answer:   Internal    Order Specific Question:   Radiology Contrast Protocol - do NOT remove file path    Answer:   [email or web address]  . CMP14+EGFR  . Lipid Panel  . TSH  . CBC   No orders of the defined types were placed in this encounter.    [name], DO [place] [phone]

## 2018-07-12 LAB — CMP14+EGFR
ALT: 15 IU/L (ref 0–32)
AST: 19 IU/L (ref 0–40)
Albumin/Globulin Ratio: 1.7 (ref 1.2–2.2)
Albumin: 4.2 g/dL (ref 3.8–4.8)
Alkaline Phosphatase: 112 IU/L (ref 39–117)
BUN/Creatinine Ratio: 17 (ref 12–28)
BUN: 14 mg/dL (ref 8–27)
Bilirubin Total: 0.6 mg/dL (ref 0.0–1.2)
CALCIUM: 9.2 mg/dL (ref 8.7–10.3)
CO2: 25 mmol/L (ref 20–29)
CREATININE: 0.81 mg/dL (ref 0.57–1.00)
Chloride: 100 mmol/L (ref 96–106)
GFR calc Af Amer: 85 mL/min/{1.73_m2} (ref >59)
GFR, EST NON AFRICAN AMERICAN: 74 mL/min/{1.73_m2} (ref >59)
GLUCOSE: 91 mg/dL (ref 65–99)
Globulin, Total: 2.5 g/dL (ref 1.5–4.5)
Potassium: 4.5 mmol/L (ref 3.5–5.2)
SODIUM: 140 mmol/L (ref 134–144)
TOTAL PROTEIN: 6.7 g/dL (ref 6.0–8.5)

## 2018-07-12 LAB — CBC
Hematocrit: 40.5 % (ref 34.0–46.6)
Hemoglobin: 13.3 g/dL (ref 11.1–15.9)
MCH: 31.6 pg (ref 26.6–33.0)
MCHC: 32.8 g/dL (ref 31.5–35.7)
MCV: 96 fL (ref 79–97)
PLATELETS: 187 10*3/uL (ref 150–450)
RBC: 4.21 x10E6/uL (ref 3.77–5.28)
RDW: 12.7 % (ref 11.7–15.4)
WBC: 6.9 10*3/uL (ref 3.4–10.8)

## 2018-07-12 LAB — LIPID PANEL
CHOL/HDL RATIO: 3.1 ratio (ref 0.0–4.4)
Cholesterol, Total: 169 mg/dL (ref 100–199)
HDL: 54 mg/dL (ref >39)
LDL CALC: 86 mg/dL (ref 0–99)
Triglycerides: 145 mg/dL (ref 0–149)
VLDL CHOLESTEROL CAL: 29 mg/dL (ref 5–40)

## 2018-07-12 LAB — TSH: TSH: 3.96 u[IU]/mL (ref 0.450–4.500)

## 2018-07-14 ENCOUNTER — Other Ambulatory Visit (HOSPITAL_COMMUNITY): Payer: Self-pay | Admitting: Family Medicine

## 2018-07-14 DIAGNOSIS — Z1231 Encounter for screening mammogram for malignant neoplasm of breast: Secondary | ICD-10-CM

## 2018-07-22 ENCOUNTER — Encounter: Payer: Self-pay | Admitting: Family Medicine

## 2018-07-22 ENCOUNTER — Ambulatory Visit (INDEPENDENT_AMBULATORY_CARE_PROVIDER_SITE_OTHER): Payer: Medicare Other | Admitting: Family Medicine

## 2018-07-22 VITALS — BP 118/66 | HR 70 | Temp 97.6°F | Ht 61.0 in | Wt 190.0 lb

## 2018-07-22 DIAGNOSIS — Z23 Encounter for immunization: Secondary | ICD-10-CM

## 2018-07-22 DIAGNOSIS — R05 Cough: Secondary | ICD-10-CM

## 2018-07-22 DIAGNOSIS — R0600 Dyspnea, unspecified: Secondary | ICD-10-CM

## 2018-07-22 DIAGNOSIS — R0609 Other forms of dyspnea: Secondary | ICD-10-CM

## 2018-07-22 DIAGNOSIS — R058 Other specified cough: Secondary | ICD-10-CM

## 2018-07-22 NOTE — Progress Notes (Signed)
Subjective: CC: f/u labs, DOE PCP: [name], DO PTW:SFKCL C Settles is a 73 y.o. female presenting to clinic today for:  1. Dyspnea on exertion Was seen on 07/11/2018 for same.  At that time, she thought that had been ongoing for only a couple of months but her husband notes that this is been ongoing for greater than 6 months.  She attributed the dyspnea on exertion to overall weight gain since having her gallbladder surgery.  She also had an associated nonproductive cough that has been going on for several months.  No hemoptysis, fevers.  Dyspnea on exertion occurs with simple tasks like dressing or bathing.  She was started on Pepcid for acid reflux and has noticed some improvement in the reflux symptoms but no resolution in the above.  She had a chest x-ray which was unremarkable.  After further discussion, she notes that she was treated for tuberculosis at age 44.  She had several follow-up chest x-rays per her report that were stable.  She is on an oral antihistamine as well.   ROS: Per HPI  Allergies  Allergen Reactions  . Codeine Nausea Only  . Peanut Oil Other (See Comments) and Rash    Congestion with nasal scabs Congestion with nasal scabs    Past Medical History:  Diagnosis Date  . Allergy   . Arthritis   . Asthma    as child  . Biliary dyskinesia   . Blood transfusion without reported diagnosis   . Cancer ([place])   . Cataract     Current Outpatient Medications:  Marland Kitchen  Magnesium 250 MG TABS, Take by mouth., Disp: , Rfl:  .  Multiple Vitamin (MULTIVITAMIN WITH MINERALS) TABS tablet, Take 1 tablet by mouth daily., Disp: , Rfl:  Social History   Socioeconomic History  . Marital status: Married    Spouse name: [name]  . Number of children: 3  . Years of education: 12th Grade  . Highest education level: High school graduate  Occupational History  . Occupation: Retired  Scientific laboratory technician  . Financial resource strain: Not hard at all  . Food insecurity:    Worry:  Never true    Inability: Never true  . Transportation needs:    Medical: No    Non-medical: No  Tobacco Use  . Smoking status: Never Smoker  . Smokeless tobacco: Never Used  Substance and Sexual Activity  . Alcohol use: No  . Drug use: No  . Sexual activity: Yes  Lifestyle  . Physical activity:    Days per week: 0 days    Minutes per session: 0 min  . Stress: Not at all  Relationships  . Social connections:    Talks on phone: More than three times a week    Gets together: More than three times a week    Attends religious service: More than 4 times per year    Active member of club or organization: Yes    Attends meetings of clubs or organizations: Never    Relationship status: Widowed  . Intimate partner violence:    Fear of current or ex partner: No    Emotionally abused: No    Physically abused: No    Forced sexual activity: No  Other Topics Concern  . Not on file  Social History Narrative  . Not on file   Family History  Problem Relation Age of Onset  . Arthritis Mother   . Cancer Mother   . Cancer Father   .  Hearing loss Sister   . Stroke Maternal Grandmother   . Kidney disease Paternal Grandmother     Objective: Office vital signs reviewed. BP 118/66   Pulse 70   Temp 97.6 F (36.4 C) (Oral)   Ht 5\' 1"  (1.549 m)   Wt 190 lb (86.2 kg)   BMI 35.90 kg/m   Physical Examination:  General: Awake, alert, well nourished, No acute distress Cardio: regular rate and rhythm, S1S2 heard, no murmurs appreciated Pulm: clear to auscultation bilaterally, no wheezes, rhonchi or rales; normal work of breathing on room air  The 10-year ASCVD risk score Mikey Bussing DC Jr., et al., 2013) is: 7.8%   Values used to calculate the score:     Age: 65 years     Sex: Female     Is Non-Hispanic African American: No     Diabetic: No     Tobacco smoker: No     Systolic Blood Pressure: 572 mmHg     Is BP treated: No     HDL Cholesterol: 54 mg/dL     Total Cholesterol: 169  mg/dL   Assessment/ Plan: 73 y.o. female   1. Dyspnea on exertion Uncertain etiology.  Given ongoing dyspnea and cough, I have placed referral to pulmonology.  Continue an acid and antihistamine.  Will likely benefit from pulmonary function testing.  Unsure if previous diagnosis and treatment of tuberculosis as a child is impacting her current lung function. - Ambulatory referral to Pulmonology  2. Cough present for greater than 3 weeks As above - Ambulatory referral to Pulmonology  We reviewed her recent lab test results.  I recalculated her ASCVD risk score which went down to 7.8% with today's blood pressure.  We discussed low-cholesterol diet.  We will plan to recheck her lipid panel in about 6 months.  Orders Placed This Encounter  Procedures  . Ambulatory referral to Pulmonology    Referral Priority:   Routine    Referral Type:   Consultation    Referral Reason:   Specialty Services Required    Requested Specialty:   Pulmonary Disease    Number of Visits Requested:   1   No orders of the defined types were placed in this encounter.  [name], DO [place] [phone]

## 2018-07-22 NOTE — Patient Instructions (Addendum)
Your ASCVD risk with today's blood pressure went down to 7.8%.  We discussed holding off on cholesterol medication for now. We can recheck in 6 months.  Plan to be fasting for that lab.   Preventing High Cholesterol Cholesterol is a waxy, fat-like substance that your body needs in small amounts. Your liver makes all the cholesterol that your body needs. Having high cholesterol (hypercholesterolemia) increases your risk for heart disease and stroke. Extra (excess) cholesterol comes from the food you eat, such as animal-based fat (saturated fat) from meat and some dairy products. High cholesterol can often be prevented with diet and lifestyle changes. If you already have high cholesterol, you can control it with diet and lifestyle changes, as well as medicine. What nutrition changes can be made?  Eat less saturated fat. Foods that contain saturated fat include red meat and some dairy products.  Avoid processed meats, like bacon and lunch meats.  Avoid trans fats, which are found in margarine and some baked goods.  Avoid foods and beverages that have added sugars.  Eat more fruits, vegetables, and whole grains.  Choose healthy sources of protein, such as fish, poultry, and nuts.  Choose healthy sources of fat, such as: ? Nuts. ? Vegetable oils, especially olive oil. ? Fish that have healthy fats (omega-3 fatty acids), such as mackerel or salmon. What lifestyle changes can be made?   Lose weight if you are overweight. Losing 5-10 lb (2.3-4.5 kg) can help prevent or control high cholesterol and reduce your risk for diabetes and high blood pressure. Ask your health care provider to help you with a diet and exercise plan to safely lose weight.  Get enough exercise. Do at least 150 minutes of moderate-intensity exercise each week. ? You could do this in short exercise sessions several times a day, or you could do longer exercise sessions a few times a week. For example, you could take a brisk  10-minute walk or bike ride, 3 times a day, for 5 days a week.  Do not smoke. If you need help quitting, ask your health care provider.  Limit your alcohol intake. If you drink alcohol, limit alcohol intake to no more than 1 drink a day for nonpregnant women and 2 drinks a day for men. One drink equals 12 oz of beer, 5 oz of wine, or 1 oz of hard liquor. Why are these changes important?  If you have high cholesterol, deposits (plaques) may build up on the walls of your blood vessels. Plaques make the arteries narrower and stiffer, which can restrict or block blood flow and cause blood clots to form. This greatly increases your risk for heart attack and stroke. Making diet and lifestyle changes can reduce your risk for these life-threatening conditions. What can I do to lower my risk?  Manage your risk factors for high cholesterol. Talk with your health care provider about all of your risk factors and how to lower your risk.  Manage other conditions that you have, such as diabetes or high blood pressure (hypertension).  Have your cholesterol checked at regular intervals.  Keep all follow-up visits as told by your health care provider. This is important. How is this treated? In addition to diet and lifestyle changes, your health care provider may recommend medicines to help lower cholesterol, such as a medicine to reduce the amount of cholesterol made in your liver. You may need medicine if:  Diet and lifestyle changes do not lower your cholesterol enough.  You have high  cholesterol and other risk factors for heart disease or stroke. Take over-the-counter and prescription medicines only as told by your health care provider. Where to find more information  American Heart Association: ThisTune.com.pt.jsp  National Heart, Lung, and Blood Institute:  FrenchToiletries.com.cy Summary  High cholesterol increases your risk for heart disease and stroke. By keeping your cholesterol level low, you can reduce your risk for these conditions.  Diet and lifestyle changes are the most important steps in preventing high cholesterol.  Work with your health care provider to manage your risk factors, and have your blood tested regularly. This information is not intended to replace advice given to you by your health care provider. Make sure you discuss any questions you have with your health care provider. Document Released: 05/22/2015 Document Revised: 01/14/2016 Document Reviewed: 01/14/2016 Elsevier Interactive Patient Education  2019 [employer].

## 2018-07-23 DIAGNOSIS — R0609 Other forms of dyspnea: Secondary | ICD-10-CM | POA: Diagnosis not present

## 2018-07-23 DIAGNOSIS — R05 Cough: Secondary | ICD-10-CM | POA: Diagnosis not present

## 2018-07-23 DIAGNOSIS — Z23 Encounter for immunization: Secondary | ICD-10-CM | POA: Diagnosis not present

## 2018-08-01 ENCOUNTER — Ambulatory Visit (HOSPITAL_COMMUNITY)
Admission: RE | Admit: 2018-08-01 | Discharge: 2018-08-01 | Disposition: A | Discharge status: Home / Self Care | Payer: Medicare Other | Source: Ambulatory Visit | Attending: Family Medicine | Admitting: Family Medicine

## 2018-08-01 ENCOUNTER — Other Ambulatory Visit: Payer: Self-pay

## 2018-08-01 DIAGNOSIS — Z1231 Encounter for screening mammogram for malignant neoplasm of breast: Secondary | ICD-10-CM | POA: Insufficient documentation

## 2018-09-24 ENCOUNTER — Ambulatory Visit (INDEPENDENT_AMBULATORY_CARE_PROVIDER_SITE_OTHER): Payer: Medicare Other | Admitting: Family Medicine

## 2018-09-24 ENCOUNTER — Other Ambulatory Visit: Payer: Self-pay

## 2018-09-24 ENCOUNTER — Encounter: Payer: Self-pay | Admitting: Family Medicine

## 2018-09-24 VITALS — BP 143/91 | HR 76 | Temp 97.6°F | Ht 61.0 in | Wt 190.8 lb

## 2018-09-24 DIAGNOSIS — S161XXA Strain of muscle, fascia and tendon at neck level, initial encounter: Secondary | ICD-10-CM | POA: Diagnosis not present

## 2018-09-24 MED ORDER — CYCLOBENZAPRINE HCL 10 MG PO TABS: 10.0000 mg | ORAL_TABLET | Freq: Three times a day (TID) | ORAL | 0 refills | Status: DC | PRN

## 2018-09-24 MED ORDER — METHYLPREDNISOLONE ACETATE 80 MG/ML IJ SUSP
80.0000 mg | Freq: Once | INTRAMUSCULAR | Status: AC
  Administered 2018-09-24: 80 mg via INTRAMUSCULAR

## 2018-09-24 NOTE — Progress Notes (Signed)
BP (!) 143/91   Pulse 76   Temp 97.6 F (36.4 C) (Oral)   Ht 5\' 1"  (1.549 m)   Wt 190 lb 12.8 oz (86.5 kg)   BMI 36.05 kg/m    Subjective:   Patient ID: Virginia Nash, female    DOB: [birth year], 73 y.o.   MRN: [id number]  HPI: Virginia Nash is a 73 y.o. female presenting on 09/24/2018 for Shoulder Pain (Left - x 1 month but has gotten worse x 3 days)   HPI Left shoulder neck pain Patient is coming in today complaining of left shoulder and neck pain that is been bothering her over the past couple months off and on and it was mild but over the past 5 days it has become much more severe and much more intense and has been bothering her all of the time rather than just some of the time.  She denies any trauma or any specific thing that brought it on but she feels like she might of just slept wrong.  It hurts mainly down the left side of her neck onto the top of her left shoulder and some onto the lateral aspect of her left shoulder but does not go down the arm or she denies any numbness or weakness or loss of grip strength down the arm.  She says she took some muscle relaxer that she had leftover from a long time ago and it did help some and then she has been using blue ice and heating pad which do not seem to be making a difference at this point.  She says it bothers her a lot at night especially when she is trying to sleep because it is so stiff and tight.  Relevant past medical, surgical, family and social history reviewed and updated as indicated. Interim medical history since our last visit reviewed. Allergies and medications reviewed and updated.  Review of Systems  Constitutional: Negative for chills and fever.  Eyes: Negative for visual disturbance.  Respiratory: Negative for chest tightness and shortness of breath.   Cardiovascular: Negative for chest pain and leg swelling.  Musculoskeletal: Positive for arthralgias and neck pain. Negative for back pain and gait problem.  Skin:  Negative for rash.  Neurological: Negative for light-headedness and headaches.  Psychiatric/Behavioral: Negative for agitation and behavioral problems.  All other systems reviewed and are negative.   Per HPI unless specifically indicated above   Allergies as of 09/24/2018      Reactions   Codeine Nausea Only   Peanut Oil Other (See Comments), Rash   Congestion with nasal scabs Congestion with nasal scabs      Medication List       Accurate as of Sep 24, 2018 12:21 PM. Always use your most recent med list.        cyclobenzaprine 10 MG tablet Commonly known as:  FLEXERIL Take 1 tablet (10 mg total) by mouth 3 (three) times daily as needed for muscle spasms.   Magnesium 250 MG Tabs Take by mouth.   multivitamin with minerals Tabs tablet Take 1 tablet by mouth daily.        Objective:   BP (!) 143/91   Pulse 76   Temp 97.6 F (36.4 C) (Oral)   Ht 5\' 1"  (1.549 m)   Wt 190 lb 12.8 oz (86.5 kg)   BMI 36.05 kg/m   Wt Readings from Last 3 Encounters:  09/24/18 190 lb 12.8 oz (86.5 kg)  07/22/18 190 lb (86.2  kg)  07/11/18 192 lb (87.1 kg)    Physical Exam Vitals signs and nursing note reviewed.  Constitutional:      General: She is not in acute distress.    Appearance: She is well-developed. She is not diaphoretic.  Eyes:     Conjunctiva/sclera: Conjunctivae normal.  Cardiovascular:     Rate and Rhythm: Normal rate and regular rhythm.     Heart sounds: Normal heart sounds. No murmur.  Pulmonary:     Effort: Pulmonary effort is normal. No respiratory distress.     Breath sounds: Normal breath sounds. No wheezing.  Musculoskeletal: Normal range of motion.     Cervical back: She exhibits tenderness and spasm. She exhibits normal range of motion, no swelling and no deformity.       Back:  Skin:    General: Skin is warm and dry.     Findings: No rash.  Neurological:     Mental Status: She is alert and oriented to person, place, and time.     Coordination:  Coordination normal.  Psychiatric:        Behavior: Behavior normal.       Assessment & Plan:   Problem List Items Addressed This Visit    None    Visit Diagnoses    Neck muscle strain, initial encounter    -  Primary   Relevant Medications   methylPREDNISolone acetate (DEPO-MEDROL) injection 80 mg (Completed) (Start on 09/24/2018 12:30 PM)   cyclobenzaprine (FLEXERIL) 10 MG tablet       Follow up plan: Return if symptoms worsen or fail to improve.  Counseling provided for all of the vaccine components No orders of the defined types were placed in this encounter.   Caryl Pina, MD [facility] 09/24/2018, 12:21 PM

## 2018-10-30 ENCOUNTER — Ambulatory Visit (INDEPENDENT_AMBULATORY_CARE_PROVIDER_SITE_OTHER): Payer: Medicare Other | Admitting: Emergency Medicine

## 2018-10-30 ENCOUNTER — Other Ambulatory Visit: Payer: Self-pay

## 2018-10-30 ENCOUNTER — Encounter: Payer: Self-pay | Admitting: Emergency Medicine

## 2018-10-30 VITALS — BP 120/76 | HR 65 | Temp 98.2°F | Ht 61.0 in | Wt 189.2 lb

## 2018-10-30 DIAGNOSIS — R0602 Shortness of breath: Secondary | ICD-10-CM

## 2018-10-30 DIAGNOSIS — R05 Cough: Secondary | ICD-10-CM | POA: Diagnosis not present

## 2018-10-30 DIAGNOSIS — R0600 Dyspnea, unspecified: Secondary | ICD-10-CM

## 2018-10-30 DIAGNOSIS — R0609 Other forms of dyspnea: Secondary | ICD-10-CM | POA: Insufficient documentation

## 2018-10-30 DIAGNOSIS — Z8611 Personal history of tuberculosis: Secondary | ICD-10-CM

## 2018-10-30 DIAGNOSIS — R053 Chronic cough: Secondary | ICD-10-CM | POA: Insufficient documentation

## 2018-10-30 NOTE — Addendum Note (Signed)
Addended by: Hildred Alamin I on: 10/30/2018 10:28 AM   Modules accepted: Orders

## 2018-10-30 NOTE — Assessment & Plan Note (Signed)
Clearly need to rule out obstructive lung disease as above.  She has both GERD and allergic rhinitis which could be contributors.  While we obtain pulmonary function testing I will try to treat both empirically to see if her cough benefits.

## 2018-10-30 NOTE — Progress Notes (Signed)
Subjective:    Patient ID: Virginia Nash, female    DOB: [birth year], 73 y.o.   MRN: [id number]  HPI Virginia Nash is 3, never smoker, has a history of obesity, allergic rhinitis previously on allergy shots, esophageal reflux, uterine CA (treated).  She reports that she had childhood asthma.  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    She is referred today for persistent cough and progressive exertional dyspnea. Worse over the last 6-8 months. Notices it with cooking, walking, chores. Has gained some wt over the last several months - 20 lbs. Has developed increased dry cough, non-productive. Has occasional seasonal allergies. She has a lot of GERD, has tried PPI before without much change in either the GERD or the cough. No real wheeze in addition to the cough. She can have acute sharp substernal pain, sometimes at rest. Had a Cards w/u at [facility] 2016 >> reassuring.    Review of Systems  HENT: Positive for trouble swallowing.   Respiratory: Positive for chest tightness and shortness of breath.   Cardiovascular: Positive for chest pain.  Musculoskeletal: Positive for arthralgias and joint swelling.   Past Medical History:  Diagnosis Date  . Allergy   . Arthritis   . Asthma    as child  . Biliary dyskinesia   . Blood transfusion without reported diagnosis   . Cancer ([place])   . Cataract      Family History  Problem Relation Age of Onset  . Arthritis Mother   . Cancer Mother   . Cancer Father   . Hearing loss Sister   . Stroke Maternal Grandmother   . Kidney disease Paternal Grandmother      Social History   Socioeconomic History  . Marital status: Married    Spouse name: [name]  . Number of children: 3  . Years of education: 12th Grade  . Highest education level: High school graduate  Occupational History  . Occupation: Retired  Scientific laboratory technician  . Financial resource strain: Not hard at all  .  Food insecurity    Worry: Never true    Inability: Never true  . Transportation needs    Medical: No    Non-medical: No  Tobacco Use  . Smoking status: Never Smoker  . Smokeless tobacco: Never Used  Substance and Sexual Activity  . Alcohol use: No  . Drug use: No  . Sexual activity: Yes  Lifestyle  . Physical activity    Days per week: 0 days    Minutes per session: 0 min  . Stress: Not at all  Relationships  . Social connections    Talks on phone: More than three times a week    Gets together: More than three times a week    Attends religious service: More than 4 times per year    Active member of club or organization: Yes    Attends meetings of clubs or organizations: Never    Relationship status: Widowed  . Intimate partner violence    Fear of current or ex partner: No    Emotionally abused: No    Physically abused: No    Forced sexual activity: No  Other Topics Concern  . Not on file  Social History Narrative  . Not on file  she lives in [place], [place] native.    Allergies  Allergen Reactions  . Codeine Nausea Only  . Peanut Oil  Other (See Comments) and Rash    Congestion with nasal scabs Congestion with nasal scabs      Outpatient Medications Prior to Visit  Medication Sig Dispense Refill  . Magnesium 250 MG TABS Take by mouth.    . Multiple Vitamin (MULTIVITAMIN WITH MINERALS) TABS tablet Take 1 tablet by mouth daily.    . cyclobenzaprine (FLEXERIL) 10 MG tablet Take 1 tablet (10 mg total) by mouth 3 (three) times daily as needed for muscle spasms. 30 tablet 0   No facility-administered medications prior to visit.         Objective:   Physical Exam Vitals:   10/30/18 0934  BP: 120/76  Pulse: 65  Temp: 98.2 F (36.8 C)  TempSrc: Oral  SpO2: 97%  Weight: 189 lb 3.2 oz (85.8 kg)  Height: 5\' 1"  (1.549 m)   Gen: Pleasant, overwt woman, in no distress,  normal affect  ENT: No lesions,  mouth clear,  oropharynx clear, no postnasal drip  Neck: No  JVD, no stridor  Lungs: No use of accessory muscles, no crackles or wheezing on normal respiration, no wheeze on forced expiration  Cardiovascular: RRR, heart sounds normal, no murmur or gallops, no peripheral edema  Musculoskeletal: No deformities, no cyanosis or clubbing  Neuro: alert, awake, non focal  Skin: Warm, no lesions or rash        Assessment & Plan:  Dyspnea on exertion Etiology unclear but she does have a history of childhood asthma, certainly this could reflect obstructive lung disease.  No wheezing but she does have cough which again could be consistent with asthma.  I think we need to evaluate with PFT before initiating bronchodilator therapy.  She is also benefit from treatment of low-grade allergic rhinitis.  I will perform a CT scan of her chest given her history of remote TB and granulomatous disease, assess for any evidence of scar, reactivation.  If her testing is unrevealing then she may merit a repeat cardiac evaluation.  Chronic cough Clearly need to rule out obstructive lung disease as above.  She has both GERD and allergic rhinitis which could be contributors.  While we obtain pulmonary function testing I will try to treat both empirically to see if her cough benefits.  Baltazar Apo, MD, PhD 10/30/2018, 10:12 AM [facility] [phone] or if no answer [phone]

## 2018-10-30 NOTE — Assessment & Plan Note (Signed)
Etiology unclear but she does have a history of childhood asthma, certainly this could reflect obstructive lung disease.  No wheezing but she does have cough which again could be consistent with asthma.  I think we need to evaluate with PFT before initiating bronchodilator therapy.  She is also benefit from treatment of low-grade allergic rhinitis.  I will perform a CT scan of her chest given her history of remote TB and granulomatous disease, assess for any evidence of scar, reactivation.  If her testing is unrevealing then she may merit a repeat cardiac evaluation.

## 2018-10-30 NOTE — Patient Instructions (Signed)
We will perform full pulmonary function testing We will perform a CT chest without contrast to better evaluate your lung granuloma, history of TB as a child.  Please try taking omeprazole 20 mg twice a day until next visit Try to adhere to a diet that will allow you to avoid esophageal reflux. Please try taking loratadine 10mg  daily until next visit.  Follow-up with Dr. Lamonte Sakai next available with full PFT to review the results and to assess your response to these medications.

## 2018-11-17 ENCOUNTER — Other Ambulatory Visit (HOSPITAL_COMMUNITY): Payer: Medicare Other

## 2018-11-18 ENCOUNTER — Other Ambulatory Visit: Payer: Self-pay

## 2018-11-18 ENCOUNTER — Ambulatory Visit (HOSPITAL_COMMUNITY)
Admission: RE | Admit: 2018-11-18 | Discharge: 2018-11-18 | Disposition: A | Discharge status: Home / Self Care | Payer: Medicare Other | Source: Ambulatory Visit | Attending: Emergency Medicine | Admitting: Emergency Medicine

## 2018-11-18 DIAGNOSIS — R0602 Shortness of breath: Secondary | ICD-10-CM | POA: Diagnosis not present

## 2018-11-18 DIAGNOSIS — Z8611 Personal history of tuberculosis: Secondary | ICD-10-CM | POA: Diagnosis not present

## 2018-12-11 ENCOUNTER — Institutional Professional Consult (permissible substitution): Payer: Medicare Other | Admitting: Emergency Medicine

## 2018-12-12 ENCOUNTER — Other Ambulatory Visit: Payer: Self-pay

## 2018-12-12 ENCOUNTER — Other Ambulatory Visit (HOSPITAL_COMMUNITY)
Admission: RE | Admit: 2018-12-12 | Discharge: 2018-12-12 | Disposition: A | Discharge status: Home / Self Care | Payer: Medicare Other | Source: Ambulatory Visit | Attending: Emergency Medicine | Admitting: Emergency Medicine

## 2018-12-12 DIAGNOSIS — Z1159 Encounter for screening for other viral diseases: Secondary | ICD-10-CM | POA: Diagnosis not present

## 2018-12-13 LAB — SARS CORONAVIRUS 2 (TAT 6-24 HRS): SARS Coronavirus 2: NEGATIVE

## 2018-12-16 ENCOUNTER — Ambulatory Visit (INDEPENDENT_AMBULATORY_CARE_PROVIDER_SITE_OTHER): Payer: Medicare Other | Admitting: Primary Care

## 2018-12-16 ENCOUNTER — Ambulatory Visit (INDEPENDENT_AMBULATORY_CARE_PROVIDER_SITE_OTHER): Payer: Medicare Other | Admitting: Emergency Medicine

## 2018-12-16 ENCOUNTER — Encounter: Payer: Self-pay | Admitting: Primary Care

## 2018-12-16 ENCOUNTER — Other Ambulatory Visit: Payer: Self-pay

## 2018-12-16 VITALS — BP 118/70 | HR 72 | Temp 98.0°F | Ht 61.0 in | Wt 191.0 lb

## 2018-12-16 DIAGNOSIS — R0600 Dyspnea, unspecified: Secondary | ICD-10-CM

## 2018-12-16 DIAGNOSIS — R0609 Other forms of dyspnea: Secondary | ICD-10-CM

## 2018-12-16 DIAGNOSIS — R1310 Dysphagia, unspecified: Secondary | ICD-10-CM

## 2018-12-16 DIAGNOSIS — R053 Chronic cough: Secondary | ICD-10-CM

## 2018-12-16 DIAGNOSIS — J4520 Mild intermittent asthma, uncomplicated: Secondary | ICD-10-CM

## 2018-12-16 DIAGNOSIS — R05 Cough: Secondary | ICD-10-CM

## 2018-12-16 LAB — PULMONARY FUNCTION TEST
DL/VA % pred: 114 %
DL/VA: 4.83 ml/min/mmHg/L
DLCO unc % pred: 99 %
DLCO unc: 17.43 ml/min/mmHg
FEF 25-75 Post: 2.54 L/sec
FEF 25-75 Pre: 1.84 L/sec
FEF2575-%Change-Post: 38 %
FEF2575-%Pred-Post: 146 %
FEF2575-%Pred-Pre: 106 %
FEV1-%Change-Post: 17 %
FEV1-%Pred-Post: 90 %
FEV1-%Pred-Pre: 76 %
FEV1-Post: 1.79 L
FEV1-Pre: 1.52 L
FEV1FVC-%Change-Post: -7 %
FEV1FVC-%Pred-Pre: 112 %
FEV6-%Change-Post: 27 %
FEV6-%Pred-Post: 89 %
FEV6-%Pred-Pre: 70 %
FEV6-Post: 2.26 L
FEV6-Pre: 1.78 L
FEV6FVC-%Pred-Post: 104 %
FEV6FVC-%Pred-Pre: 104 %
FVC-%Change-Post: 27 %
FVC-%Pred-Post: 85 %
FVC-%Pred-Pre: 67 %
FVC-Post: 2.26 L
FVC-Pre: 1.78 L
Post FEV1/FVC ratio: 79 %
Post FEV6/FVC ratio: 100 %
Pre FEV1/FVC ratio: 86 %
Pre FEV6/FVC Ratio: 100 %
RV % pred: 123 %
RV: 2.51 L
TLC % pred: 102 %
TLC: 4.74 L

## 2018-12-16 MED ORDER — MOMETASONE FURO-FORMOTEROL FUM 100-5 MCG/ACT IN AERO: 2.0000 | INHALATION_SPRAY | Freq: Two times a day (BID) | RESPIRATORY_TRACT | 0 refills | Status: DC

## 2018-12-16 NOTE — Progress Notes (Signed)
@Patient  ID: Virginia Nash, female    DOB: 1945-08-29, 73 y.o.   MRN: [id number]  Chief Complaint  Patient presents with  . Follow-up    Patient reports that she still has some sob with exertion at times.    Referring provider: [name], DO  HPI: 73 year old female, never smoked. PMH significant for dyspnea on exertion, chronic cough, arthritis, trigeminal neuralgia, obesity. Patient of Dr. Lamonte Sakai, seen for initial consult for dyspnea on exertion. 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 Unclear etiology but she does have a history of childhood asthma. Started on omeprazole 20mg  twice daily and loratadine 10mg  daily. Ordered for PFTs and CT chest.  If testing unrevealing recommend repeat cardiac evaluation.   12/16/2018 Patient presents today for 1 month follow-up. Doing well, still has shortness of breath with exertion at times. She becomes tired easily. Get winded showering and walking up stairs. Coughs when she drinks and eats. States that she can not eat rice and that she typically sticks with soft foods. Barium swallow in 2018 showed premature spill into the valleculae and piriform sinuses and small hiatal hernia. Otherwise, normal barium esophagram.    PFTs 12/16/2018 FVC 2.26 (85%), FEV1 1.79 (90%), ratio 79, positive BD response, normal diffusion capacity   Imaging: CT shows no acute cardiopulmonary findings. Calcified granuloma noted right lower lobe. No suspicious nodule or mass. Trace atelectasis noted in the lingula and left lower lobe. No mediastinal lymphadenopathy. Calcified lymph nodes in the mediastinum are compatible with sequelae of granulomatous disease  Allergies  Allergen Reactions  . Codeine Nausea Only  . Peanut Oil Other (See Comments) and Rash    Congestion with nasal scabs Congestion with nasal scabs     Immunization History  Administered Date(s) Administered  . Influenza,inj,Quad PF,6+ Mos 07/23/2018  .  Influenza,inj,quad, With Preservative 02/17/2018  . Influenza-Unspecified 03/02/2015, 03/16/2016, 01/30/2017  . Pneumococcal Conjugate-13 10/21/2013  . Pneumococcal Polysaccharide-23 02/15/2015    Past Medical History:  Diagnosis Date  . Allergy   . Arthritis   . Asthma    as child  . Biliary dyskinesia   . Blood transfusion without reported diagnosis   . Cancer ([place])   . Cataract     Tobacco History: Social History   Tobacco Use  Smoking Status Never Smoker  Smokeless Tobacco Never Used   Counseling given: Not Answered   Outpatient Medications Prior to Visit  Medication Sig Dispense Refill  . loratadine (CLARITIN) 10 MG tablet Take 10 mg by mouth daily.    . Magnesium 250 MG TABS Take by mouth.    . Multiple Vitamin (MULTIVITAMIN WITH MINERALS) TABS tablet Take 1 tablet by mouth daily.    Marland Kitchen omeprazole (PRILOSEC) 20 MG capsule Take 20 mg by mouth daily.     No facility-administered medications prior to visit.    Review of Systems  Review of Systems  Constitutional: Negative.   HENT: Positive for trouble swallowing.   Respiratory: Positive for cough and shortness of breath. Negative for wheezing.   Cardiovascular: Negative.    Physical Exam  BP 118/70 (BP Location: Left Arm, Cuff Size: Normal)   Pulse 72   Temp 98 F (36.7 C) (Oral)   Ht 5\' 1"  (1.549 m)   Wt 191 lb (86.6 kg)   SpO2 96%   BMI 36.09 kg/m  Physical Exam Constitutional:      Appearance: Normal appearance. She is not ill-appearing.  HENT:  Head: Normocephalic and atraumatic.  Neck:     Musculoskeletal: Normal range of motion and neck supple.  Cardiovascular:     Rate and Rhythm: Normal rate and regular rhythm.  Pulmonary:     Effort: Pulmonary effort is normal.     Breath sounds: Normal breath sounds. No wheezing.  Skin:    General: Skin is warm and dry.  Neurological:     General: No focal deficit present.     Mental Status: She is alert and oriented to person, place, and time.  Mental status is at baseline.  Psychiatric:        Mood and Affect: Mood normal.        Behavior: Behavior normal.        Thought Content: Thought content normal.        Judgment: Judgment normal.      Lab Results:  CBC    Component Value Date/Time   WBC 6.9 07/11/2018 0859   WBC 6.5 04/07/2015 1030   RBC 4.21 07/11/2018 0859   RBC 4.43 04/07/2015 1030   HGB 13.3 07/11/2018 0859   HCT 40.5 07/11/2018 0859   PLT 187 07/11/2018 0859   MCV 96 07/11/2018 0859   MCH 31.6 07/11/2018 0859   MCH 31.4 04/07/2015 1030   MCHC 32.8 07/11/2018 0859   MCHC 32.8 04/07/2015 1030   RDW 12.7 07/11/2018 0859   LYMPHSABS 2.6 01/16/2018 1039   MONOABS 0.5 04/07/2015 1030   EOSABS 0.2 01/16/2018 1039   BASOSABS 0.1 01/16/2018 1039    BMET    Component Value Date/Time   NA 140 07/11/2018 0859   K 4.5 07/11/2018 0859   CL 100 07/11/2018 0859   CO2 25 07/11/2018 0859   GLUCOSE 91 07/11/2018 0859   GLUCOSE 108 (H) 04/07/2015 1030   BUN 14 07/11/2018 0859   CREATININE 0.81 07/11/2018 0859   CALCIUM 9.2 07/11/2018 0859   GFRNONAA 74 07/11/2018 0859   GFRAA 85 07/11/2018 0859    BNP No results found for: BNP  ProBNP No results found for: PROBNP  Imaging: Ct Chest Wo Contrast  Result Date: 11/18/2018 CLINICAL DATA:  Chronic cough.  Shortness of breath. EXAM: CT CHEST WITHOUT CONTRAST TECHNIQUE: Multidetector CT imaging of the chest was performed following the standard protocol without IV contrast. COMPARISON:  12/30/2014 FINDINGS: Cardiovascular: The heart size is normal. No substantial pericardial effusion. Coronary artery calcification is evident. Atherosclerotic calcification is noted in the wall of the thoracic aorta. Mediastinum/Nodes: No mediastinal lymphadenopathy. Calcified lymph nodes in the mediastinum are compatible with sequelae of granulomatous disease. No evidence for gross hilar lymphadenopathy although assessment is limited by the lack of intravenous contrast on today's  study. The esophagus has normal imaging features. There is no axillary lymphadenopathy. Lungs/Pleura: Calcified granuloma noted right lower lobe. No suspicious nodule or mass. No focal airspace consolidation. Trace atelectasis noted in the lingula and left lower lobe. No pleural effusion. Upper Abdomen: Unremarkable. Musculoskeletal: No worrisome lytic or sclerotic osseous abnormality. IMPRESSION: 1. No acute cardiopulmonary findings. 2. Sequelae of prior granulomatous disease. 3.  Aortic Atherosclerois (ICD10-170.0) Electronically Signed   By: Misty Stanley M.D.   On: 11/18/2018 12:21     Assessment & Plan:   Mild intermittent asthma - PFTs showed pre-bronch restriction with positive bronchodilator response - Symptoms consistent mild intermittent asthma and physical deconditioning  - Trial Dulera 100 two puffs twice daily (sample given) - FU in 4 weeks   Chronic cough - No evidence of obstruction on PFTs -  Continue omeprazole 20mg  BID - Refer to GI for swallowing difficulties ? aspiration   Martyn Ehrich, NP 12/17/2018

## 2018-12-16 NOTE — Progress Notes (Signed)
Full PFT performed today. °

## 2018-12-16 NOTE — Patient Instructions (Signed)
Trial low dose inhaled corticosteroid + long acting bronchodilator  Rx: Sample Dulera 100 - take 2 puffs twice daily (if you find benefit from inhaler please let us know and we will send in prescription)  Recommendations: Continue omeprazole 20mg  twice daily Continue Loratadine 10mg  daily  Referral: Gastroenterology for swallowing difficulties/cough   Follow up: 4 weeks telelvisit with Dr. Lamonte Sakai or NP

## 2018-12-16 NOTE — Assessment & Plan Note (Deleted)
-   Symptoms consistent mild intermittent asthma and physical deconditioning  - PFTs showed pre-bronch restriction with positive bronchodilator response - Trial low dose ICS/LABA - FU in 4 weeks

## 2018-12-17 ENCOUNTER — Encounter: Payer: Self-pay | Admitting: Primary Care

## 2018-12-17 DIAGNOSIS — J4520 Mild intermittent asthma, uncomplicated: Secondary | ICD-10-CM | POA: Insufficient documentation

## 2018-12-17 NOTE — Assessment & Plan Note (Signed)
-   No evidence of obstruction on PFTs - Continue omeprazole 20mg  BID - Refer to GI for swallowing difficulties ? aspiration

## 2018-12-17 NOTE — Assessment & Plan Note (Deleted)
-   No evidence of obstruction on PFTs - Continue omeprazole 20mg  BID - Refer to GI for swallowing difficulties ? aspiration

## 2018-12-17 NOTE — Assessment & Plan Note (Addendum)
-   PFTs showed pre-bronch restriction with positive bronchodilator response - Symptoms consistent mild intermittent asthma and physical deconditioning  - Trial Dulera 100 two puffs twice daily (sample given) - FU in 4 weeks

## 2018-12-25 ENCOUNTER — Other Ambulatory Visit: Payer: Self-pay

## 2018-12-25 ENCOUNTER — Encounter (INDEPENDENT_AMBULATORY_CARE_PROVIDER_SITE_OTHER): Payer: Self-pay | Admitting: *Deleted

## 2018-12-25 ENCOUNTER — Encounter (INDEPENDENT_AMBULATORY_CARE_PROVIDER_SITE_OTHER): Payer: Self-pay | Admitting: Nurse Practitioner

## 2018-12-25 ENCOUNTER — Ambulatory Visit (INDEPENDENT_AMBULATORY_CARE_PROVIDER_SITE_OTHER): Payer: Medicare Other | Admitting: Nurse Practitioner

## 2018-12-25 DIAGNOSIS — R1310 Dysphagia, unspecified: Secondary | ICD-10-CM | POA: Insufficient documentation

## 2018-12-25 DIAGNOSIS — R1312 Dysphagia, oropharyngeal phase: Secondary | ICD-10-CM

## 2018-12-25 DIAGNOSIS — K30 Functional dyspepsia: Secondary | ICD-10-CM | POA: Diagnosis not present

## 2018-12-25 DIAGNOSIS — R110 Nausea: Secondary | ICD-10-CM | POA: Diagnosis not present

## 2018-12-25 NOTE — Patient Instructions (Signed)
1. Schedule an upper endoscopy (EGD) with Dr. Laural Golden  2. Continue Omeprazole 40mg  once daily  3. If your shortness of breath does not resolve with using your inhaler prescribed by your pulmonologist, I would recommend you contact your primary physician to schedule a consult with a cardiologist

## 2018-12-25 NOTE — Progress Notes (Addendum)
Subjective:    Patient ID: Virginia Nash, female    DOB: 06/03/1945, 73 y.o.   MRN: [id number]  HPI  Patient with past medical history significant for uterine cancer 2016, arthritis, asthma as a child with current evaluation for dyspnea.  She complains of having difficulty swallowing for 15 to 20 years. Food such as rice will get stuck in her throat/upper esophagus. Drinking water makes it worse, water just backs up. She complains of having indigestion/heartburn daily for the past 4 to 5 months. Some nausea. She is taking Omeprazole 22m once daily for a few months as prescribed by her pulmonologist. She has dyspnea which is worse with exertion, somewhat improved after she recently started taking Albuterol inhaler. She has not seen a cardiologist. She underwent a chest CT 11/18/2018 which showed sequelae of prior granulomatous disease, no acute cardiopulmonary findings. She reported having TB as a child age 73 received injections for 1 year. She is passing normal formed stools most days. No rectal bleeding. She underwent a colonoscopy 07/31/2017 which showed diverticulosis to the sigmoid colon. Repeat colonoscopy in 10 yrs recommended.  Stress level elevated, husband with stage IV lung cancer.  Past Medical History:  Diagnosis Date  . Allergy   . Arthritis   . Asthma    as child  . Biliary dyskinesia   . Blood transfusion without reported diagnosis   . [place]   . Cataract    Past Surgical History:  Procedure Laterality Date  . ABDOMINAL HYSTERECTOMY    . CHOLECYSTECTOMY N/A 04/12/2015   Procedure: LAPAROSCOPIC CHOLECYSTECTOMY;  Surgeon: JJudeth Horn MD;  Location: [facility]  Service: General;  Laterality: N/A;  . COLONOSCOPY N/A 07/31/2017   Procedure: COLONOSCOPY;  Surgeon: RRogene Houston MD;  [facility];  Service: Endoscopy;  Laterality: N/A;  830  . DILATION AND CURETTAGE OF UTERUS    . EYE SURGERY     cataract with lens replacement  .  TONSILLECTOMY     Current Outpatient Medications on File Prior to Visit  Medication Sig Dispense Refill  . loratadine (CLARITIN) 10 MG tablet Take 10 mg by mouth daily.    . Magnesium 250 MG TABS Take by mouth daily.     . mometasone-formoterol (DULERA) 100-5 MCG/ACT AERO Inhale 2 puffs into the lungs 2 (two) times daily. 8.8 g 0  . Multiple Vitamin (MULTIVITAMIN WITH MINERALS) TABS tablet Take 1 tablet by mouth daily.    .Marland Kitchenomeprazole (PRILOSEC) 20 MG capsule Take 20 mg by mouth daily.     No current facility-administered medications on file prior to visit.     Allergies  Allergen Reactions  . Codeine Nausea Only  . Peanut Oil Other (See Comments) and Rash    Congestion with nasal scabs Congestion with nasal scabs    Review of Systems See HPI, all other systems reviewed and are negative     Objective:   Physical Exam  BP 119/78   Pulse 71   Temp 98.2 F (36.8 C) (Oral)   Resp 18   Ht _0  (1.549 m)   Wt 192 lb 14.4 oz (87.5 kg)   BMI 36.45 kg/m   General: well developed female in NAD Eyes: sclera nonicteric, conjunctiva pink Neck: supple Heart: RRR, no murmur Lungs: clear throughout Abdomen: obese, soft, nontender, no masses or organomegaly  Extremities: no edema Neuro: alert and oriented x 3, no focal deficits    Assessment & Plan:   1. 73y.o.  female with dysphagia and GERD symptoms -EGD with possible esophageal dilatation with MAC, benefits and risks discussed, EKG prior to procedure -recommended cardiac evaluation if dyspnea does not further improve with inhaler prescribed by pulmonologist  -increase Omeprazole 9m once daily -further follow up to be determined after EGD completed

## 2019-01-07 ENCOUNTER — Ambulatory Visit (INDEPENDENT_AMBULATORY_CARE_PROVIDER_SITE_OTHER): Payer: Medicare Other | Admitting: *Deleted

## 2019-01-07 VITALS — Ht 61.0 in | Wt 192.9 lb

## 2019-01-07 DIAGNOSIS — Z0000 Encounter for general adult medical examination without abnormal findings: Secondary | ICD-10-CM

## 2019-01-07 NOTE — Patient Instructions (Addendum)
Ms. Virginia Nash , Thank you for taking time to come for your Medicare Wellness Visit. I appreciate your ongoing commitment to your health goals. Please review the following plan we discussed and let me know if I can assist you in the future.   These are the goals we discussed: Goals    . Exercise 3x per week (30 min per time)     Try to exercise for 30 minutes 3 times weekly       This is a list of the screening recommended for you and due dates:  Health Maintenance  Topic Date Due  . Flu Shot  12/20/2018  . Tetanus Vaccine  01/17/2019*  . Mammogram  07/31/2020  . Colon Cancer Screening  08/01/2027  . DEXA scan (bone density measurement)  Completed  .  Hepatitis C: One time screening is recommended by Center for Disease Control  (CDC) for  adults born from [birth year] through 1963.   Completed  . Pneumonia vaccines  Completed  *Topic was postponed. The date shown is not the original due date.       Preventive Care 40 Years and Older, Female Preventive care refers to lifestyle choices and visits with your health care provider that can promote health and wellness. This includes:  A yearly physical exam. This is also called an annual well check.  Regular dental and eye exams.  Immunizations.  Screening for certain conditions.  Healthy lifestyle choices, such as diet and exercise. What can I expect for my preventive care visit? Physical exam Your health care provider will check:  Height and weight. These may be used to calculate body mass index (BMI), which is a measurement that tells if you are at a healthy weight.  Heart rate and blood pressure.  Your skin for abnormal spots. Counseling Your health care provider may ask you questions about:  Alcohol, tobacco, and drug use.  Emotional well-being.  Home and relationship well-being.  Sexual activity.  Eating habits.  History of falls.  Memory and ability to understand (cognition).  Work and work Statistician.   Pregnancy and menstrual history. What immunizations do I need?  Influenza (flu) vaccine  This is recommended every year. Tetanus, diphtheria, and pertussis (Tdap) vaccine  You may need a Td booster every 10 years. Varicella (chickenpox) vaccine  You may need this vaccine if you have not already been vaccinated. Zoster (shingles) vaccine  You may need this after age 3. Pneumococcal conjugate (PCV13) vaccine  One dose is recommended after age 80. Pneumococcal polysaccharide (PPSV23) vaccine  One dose is recommended after age 34. Measles, mumps, and rubella (MMR) vaccine  You may need at least one dose of MMR if you were born in 1957 or later. You may also need a second dose. Meningococcal conjugate (MenACWY) vaccine  You may need this if you have certain conditions. Hepatitis A vaccine  You may need this if you have certain conditions or if you travel or work in places where you may be exposed to hepatitis A. Hepatitis B vaccine  You may need this if you have certain conditions or if you travel or work in places where you may be exposed to hepatitis B. Haemophilus influenzae type b (Hib) vaccine  You may need this if you have certain conditions. You may receive vaccines as individual doses or as more than one vaccine together in one shot (combination vaccines). Talk with your health care provider about the risks and benefits of combination vaccines. What tests do I need? Blood  tests  Lipid and cholesterol levels. These may be checked every 5 years, or more frequently depending on your overall health.  Hepatitis C test.  Hepatitis B test. Screening  Lung cancer screening. You may have this screening every year starting at age 37 if you have a 30-pack-year history of smoking and currently smoke or have quit within the past 15 years.  Colorectal cancer screening. All adults should have this screening starting at age 17 and continuing until age 62. Your health care  provider may recommend screening at age 72 if you are at increased risk. You will have tests every 1-10 years, depending on your results and the type of screening test.  Diabetes screening. This is done by checking your blood sugar (glucose) after you have not eaten for a while (fasting). You may have this done every 1-3 years.  Mammogram. This may be done every 1-2 years. Talk with your health care provider about how often you should have regular mammograms.  BRCA-related cancer screening. This may be done if you have a family history of breast, ovarian, tubal, or peritoneal cancers. Other tests  Sexually transmitted disease (STD) testing.  Bone density scan. This is done to screen for osteoporosis. You may have this done starting at age 57. Follow these instructions at home: Eating and drinking  Eat a diet that includes fresh fruits and vegetables, whole grains, lean protein, and low-fat dairy products. Limit your intake of foods with high amounts of sugar, saturated fats, and salt.  Take vitamin and mineral supplements as recommended by your health care provider.  Do not drink alcohol if your health care provider tells you not to drink.  If you drink alcohol: ? Limit how much you have to 0-1 drink a day. ? Be aware of how much alcohol is in your drink. In the [place] one drink equals one 12 oz bottle of beer (355 mL), one 5 oz glass of wine (148 mL), or one 1 oz glass of hard liquor (44 mL). Lifestyle  Take daily care of your teeth and gums.  Stay active. Exercise for at least 30 minutes on 5 or more days each week.  Do not use any products that contain nicotine or tobacco, such as cigarettes, e-cigarettes, and chewing tobacco. If you need help quitting, ask your health care provider.  If you are sexually active, practice safe sex. Use a condom or other form of protection in order to prevent STIs (sexually transmitted infections).  Talk with your health care provider about taking  a low-dose aspirin or statin. What's next?  Go to your health care provider once a year for a well check visit.  Ask your health care provider how often you should have your eyes and teeth checked.  Stay up to date on all vaccines. This information is not intended to replace advice given to you by your health care provider. Make sure you discuss any questions you have with your health care provider. Document Released: 06/03/2015 Document Revised: 05/01/2018 Document Reviewed: 05/01/2018 Elsevier Patient Education  2020 [employer].

## 2019-01-07 NOTE — Progress Notes (Signed)
MEDICARE ANNUAL WELLNESS VISIT  01/07/2019  Telephone Visit Disclaimer This Medicare AWV was conducted by telephone due to national recommendations for restrictions regarding the COVID-19 Pandemic (e.g. social distancing).  I verified, using two identifiers, that I am speaking with Virginia Nash or their authorized healthcare agent. I discussed the limitations, risks, security, and privacy concerns of performing an evaluation and management service by telephone and the potential availability of an in-person appointment in the future. The patient expressed understanding and agreed to proceed.   Subjective:  Virginia Nash is a 73 y.o. female patient of [name], DO who had a Medicare Annual Wellness Visit today via telephone. Virginia Nash is Retired and lives with their spouse. she has 3 children. she reports that she is socially active and does interact with friends/family regularly. she is minimally physically active and enjoys sewing.  Patient Care Team: [name], DO as PCP - General (Family Medicine) Gari Crown, MD (Unknown Physician Specialty)  Advanced Directives 01/07/2019 12/31/2017 12/25/2016 04/12/2015 04/05/2015  Does Patient Have a Medical Advance Directive? Yes Yes Yes Yes Yes  Type of Paramedic of [place];Living will [place];Living will [place];Living will - [place];Living will  Does patient want to make changes to medical advance directive? No - Patient declined Yes (MAU/Ambulatory/Procedural Areas - Information given) - - -  Copy of [place] in Chart? No - copy requested No - copy requested No - copy requested No - copy requested Yes    Hospital Utilization Over the Past 12 Months: # of hospitalizations or ER visits: 0 # of surgeries: 0  Review of Systems    Patient reports that her overall health is unchanged compared to last year.  Patient  Reported Readings (BP, Pulse, CBG, Weight, etc) none  Review of Systems: History obtained from chart review and the patient General ROS: negative  All other systems negative.  Pain Assessment Pain : No/denies pain     Current Medications & Allergies (verified) Allergies as of 01/07/2019      Reactions   Omeprazole Shortness Of Breath   Codeine Nausea Only   Other    Peas - rash Asparagus - rash Celery - rash   Peanut Oil Rash, Other (See Comments)   Congestion with nasal scabs      Medication List       Accurate as of January 07, 2019  9:54 AM. If you have any questions, ask your nurse or doctor.        loratadine 10 MG tablet Commonly known as: CLARITIN Take 10 mg by mouth daily.   Magnesium 250 MG Tabs Take 250 mg by mouth daily.   mometasone-formoterol 100-5 MCG/ACT Aero Commonly known as: DULERA Inhale 2 puffs into the lungs 2 (two) times daily. What changed:   when to take this  additional instructions   multivitamin with minerals Tabs tablet Take 1 tablet by mouth daily.   THERAWORX RELIEF EX Apply 1 application topically daily as needed (pain).       History (reviewed): Past Medical History:  Diagnosis Date  . Allergy   . Arthritis   . Asthma    as child  . Biliary dyskinesia   . Blood transfusion without reported diagnosis   . Cancer ([place])   . Cataract    Past Surgical History:  Procedure Laterality Date  . ABDOMINAL HYSTERECTOMY    . CHOLECYSTECTOMY N/A 04/12/2015  Procedure: LAPAROSCOPIC CHOLECYSTECTOMY;  Surgeon: Judeth Horn, MD;  Location: [facility];  Service: General;  Laterality: N/A;  . COLONOSCOPY N/A 07/31/2017   Procedure: COLONOSCOPY;  Surgeon: Rogene Houston, MD;  [facility];  Service: Endoscopy;  Laterality: N/A;  830  . DILATION AND CURETTAGE OF UTERUS    . EYE SURGERY     cataract with lens replacement  . TONSILLECTOMY     Family History  Problem Relation Age of Onset  . Arthritis  Mother   . Cancer Mother   . Cancer Father   . Hearing loss Sister   . Stroke Maternal Grandmother   . Kidney disease Paternal Grandmother    Social History   Socioeconomic History  . Marital status: Married    Spouse name: [name]  . Number of children: 3  . Years of education: 12th Grade  . Highest education level: High school graduate  Occupational History  . Occupation: Retired  Scientific laboratory technician  . Financial resource strain: Not hard at all  . Food insecurity    Worry: Never true    Inability: Never true  . Transportation needs    Medical: No    Non-medical: No  Tobacco Use  . Smoking status: Never Smoker  . Smokeless tobacco: Never Used  Substance and Sexual Activity  . Alcohol use: No  . Drug use: No  . Sexual activity: Yes  Lifestyle  . Physical activity    Days per week: 0 days    Minutes per session: 0 min  . Stress: Not at all  Relationships  . Social connections    Talks on phone: More than three times a week    Gets together: More than three times a week    Attends religious service: More than 4 times per year    Active member of club or organization: Yes    Attends meetings of clubs or organizations: Never    Relationship status: Widowed  Other Topics Concern  . Not on file  Social History Narrative  . Not on file    Activities of Daily Living In your present state of health, do you have any difficulty performing the following activities: 01/07/2019  Hearing? N  Vision? N  Difficulty concentrating or making decisions? N  Walking or climbing stairs? N  Dressing or bathing? N  Doing errands, shopping? N  Preparing Food and eating ? N  Using the Toilet? N  In the past six months, have you accidently leaked urine? Y  Do you have problems with loss of bowel control? N  Managing your Medications? N  Managing your Finances? N  Housekeeping or managing your Housekeeping? N  Some recent data might be hidden    Patient Education/ Literacy How often do  you need to have someone help you when you read instructions, pamphlets, or other written materials from your doctor or pharmacy?: 1 - Never What is the last grade level you completed in school?: 12th Grade, [employer]  Exercise Current Exercise Habits: The patient does not participate in regular exercise at present, Exercise limited by: None identified  Diet Patient reports consuming 3 meals a day and 1 snack(s) a day Patient reports that her primary diet is: Regular Patient reports that she does have regular access to food.   Depression Screen PHQ 2/9 Scores 07/22/2018 07/11/2018 04/07/2018 01/16/2018 12/31/2017 08/14/2017 07/17/2017  PHQ - 2 Score 0 2 0 0 0 0 0  PHQ- 9 Score 5 9 - - - - -  Fall Risk Fall Risk  07/22/2018 07/11/2018 04/23/2018 04/07/2018 01/16/2018  Falls in the past year? 0 0 0 0 No     Objective:  Virginia Nash seemed alert and oriented and she participated appropriately during our telephone visit.  Blood Pressure Weight BMI  BP Readings from Last 3 Encounters:  12/25/18 119/78  12/16/18 118/70  10/30/18 120/76   Wt Readings from Last 3 Encounters:  01/07/19 192 lb 14.4 oz (87.5 kg)  12/25/18 192 lb 14.4 oz (87.5 kg)  12/16/18 191 lb (86.6 kg)   BMI Readings from Last 1 Encounters:  01/07/19 36.45 kg/m    *Unable to obtain current vital signs, weight, and BMI due to telephone visit type  Hearing/Vision  . Virginia Nash did not seem to have difficulty with hearing/understanding during the telephone conversation . Reports that she has not had a formal eye exam by an eye care professional within the past year . Reports that she has not had a formal hearing evaluation within the past year *Unable to fully assess hearing and vision during telephone visit type  Cognitive Function: 6CIT Screen 01/07/2019  What Year? 0 points  What month? 0 points  What time? 0 points  Count back from 20 0 points  Months in reverse 0 points  Repeat phrase 0 points  Total  Score 0   (Normal:0-7, Significant for Dysfunction: >8)  Normal Cognitive Function Screening: Yes   Immunization & Health Maintenance Record Immunization History  Administered Date(s) Administered  . Influenza,inj,Quad PF,6+ Mos 07/23/2018  . Influenza,inj,quad, With Preservative 02/17/2018  . Influenza-Unspecified 03/02/2015, 03/16/2016, 01/30/2017  . Pneumococcal Conjugate-13 10/21/2013  . Pneumococcal Polysaccharide-23 02/15/2015    Health Maintenance  Topic Date Due  . INFLUENZA VACCINE  12/20/2018  . TETANUS/TDAP  01/17/2019 (Originally 12/28/1964)  . MAMMOGRAM  07/31/2020  . COLONOSCOPY  08/01/2027  . DEXA SCAN  Completed  . Hepatitis C Screening  Completed  . PNA vac Low Risk Adult  Completed       Assessment  This is a routine wellness examination for Virginia Nash.  Health Maintenance: Due or Overdue Health Maintenance Due  Topic Date Due  . INFLUENZA VACCINE  12/20/2018    Virginia Nash does not need a referral for Community Assistance: Care Management:   no Social Work:    no Prescription Assistance:  no Nutrition/Diabetes Education:  no   Plan:  Personalized Goals Goals Addressed   None    Personalized Health Maintenance & Screening Recommendations  Influenza vaccine Td vaccine  Lung Cancer Screening Recommended: no (Low Dose CT Chest recommended if Age 28-80 years, 30 pack-year currently smoking OR have quit w/in past 15 years) Hepatitis C Screening recommended: no HIV Screening recommended: no  Advanced Directives: Written information was not prepared per patient's request.  Referrals & Orders No orders of the defined types were placed in this encounter.   Follow-up Plan . Follow-up with [name], DO as planned   I have personally reviewed and noted the following in the patient's chart:   . Medical and social history . Use of alcohol, tobacco or illicit drugs  . Current medications and supplements . Functional  ability and status . Nutritional status . Physical activity . Advanced directives . List of other physicians . Hospitalizations, surgeries, and ER visits in previous 12 months . Vitals . Screenings to include cognitive, depression, and falls . Referrals and appointments  In addition, I have reviewed and discussed with Virginia Nash certain preventive protocols,  quality metrics, and best practice recommendations. A written personalized care plan for preventive services as well as general preventive health recommendations is available and can be mailed to the patient at her request.      Wardell Heath, LPN  [id number]

## 2019-01-09 ENCOUNTER — Encounter: Payer: Medicare Other | Admitting: *Deleted

## 2019-01-12 ENCOUNTER — Telehealth: Payer: Self-pay | Admitting: Primary Care

## 2019-01-12 DIAGNOSIS — R0609 Other forms of dyspnea: Secondary | ICD-10-CM

## 2019-01-12 DIAGNOSIS — R0600 Dyspnea, unspecified: Secondary | ICD-10-CM

## 2019-01-12 MED ORDER — MOMETASONE FURO-FORMOTEROL FUM 100-5 MCG/ACT IN AERO: 2.0000 | INHALATION_SPRAY | Freq: Two times a day (BID) | RESPIRATORY_TRACT | 6 refills | Status: DC

## 2019-01-12 NOTE — Telephone Encounter (Signed)
Called patient and advised prescription to be sent to pharmacy. Advised to keep the televisit on 01/14/19. Patient voiced understanding. Nothing further needed at this time.

## 2019-01-12 NOTE — Telephone Encounter (Signed)
6 refills please, thanks

## 2019-01-12 NOTE — Telephone Encounter (Signed)
Message routed to Derl Barrow, NP for review and approval:  Virginia Nash, based on the information from her visit with you on 12/16/18, please advise how many refills you want attached to the prescription.  The patient currently has as televisit with you on 01/14/19. Thanks.  Mild intermittent asthma - PFTs showed pre-bronch restriction with positive bronchodilator response - Symptoms consistent mild intermittent asthma and physical deconditioning  - Trial Dulera 100 two puffs twice daily (sample given) - FU in 4 weeks   Chronic cough - No evidence of obstruction on PFTs - Continue omeprazole 20mg  BID - Refer to GI for swallowing difficulties ? aspiration   Martyn Ehrich, NP 12/17/2018

## 2019-01-12 NOTE — Progress Notes (Signed)
Virtual Visit via Telephone Note  I connected with Virginia Nash on 01/14/19 at  9:15 AM EDT by telephone and verified that I am speaking with the correct person using two identifiers.  Location: Patient: Home Provider: Office   I discussed the limitations, risks, security and privacy concerns of performing an evaluation and management service by telephone and the availability of in person appointments. I also discussed with the patient that there may be a patient responsible charge related to this service. The patient expressed understanding and agreed to proceed.   History of Present Illness: 73 year old female, never smoked. PMH significant for dyspnea on exertion, chronic cough, arthritis, trigeminal neuralgia, obesity. Patient of Dr. Lamonte Sakai, seen for initial consult for dyspnea on exertion. 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 Unclear etiology but she does have a history of childhood asthma. Started on omeprazole 20mg  twice daily and loratadine 10mg  daily. Ordered for PFTs and CT chest.  If testing unrevealing recommend repeat cardiac evaluation.   Previous [facility] pulmonary encounter: 12/16/2018 Patient presents today for 1 month follow-up. Doing well, still has shortness of breath with exertion at times. She becomes tired easily. Get winded showering and walking up stairs. Coughs when she drinks and eats. States that she can not eat rice and that she typically sticks with soft foods. Barium swallow in 2018 showed premature spill into the valleculae and piriform sinuses and small hiatal hernia. Otherwise, normal barium esophagram. Given sample Dulera. Referred to GI for swallowing difficulties.  01/14/2019 Patient contacted today for 4 week follow-up. She reports some improvement in her breathing. States that [employer] has worked well for her and would like a prescription sent to her pharmacy. Experiences very rare wheezing. Reports that she stoppped  omeprazole because she hasn't seen a difference. Feels inhaler has helped more. Saw GI on 12/25/18 for dysphagia and GERD symptoms. Planning for EGD with possible esophageal dilation later this week. May need cardiac referral if dyspnea does not further improve with inhaler use. PFTs showed no evidence of obstruction with normal diffusion capacity.    Significant testing: PFTs 12/16/2018 FVC 2.26 (85%), FEV1 1.79 (90%), ratio 79, positive BD response, normal diffusion capacity   Imaging: CT shows no acute cardiopulmonary findings. Calcified granuloma noted right lower lobe. No suspicious nodule or mass. Trace atelectasis noted in the lingula and left lower lobe. No mediastinal lymphadenopathy. Calcified lymph nodes in the mediastinum are compatible with sequelae of granulomatous disease  Observations/Objective:  - No shortness of breath, wheezing or cough noted during phone conversatin  Assessment and Plan:  Dyspnea: - PFTs showed pre-bronch restriction with positive bronchodilator response - Symptoms consistent mild intermittent asthma and physical deconditioning  - Patient reports some improvement with Dulera twice daily (RX sent) - If continues to have dyspnea despite inhaler use consider referral to cardiology   Dysphagia: - Planning for EGD on 8/28 with GI   Follow Up Instructions:   - FU in 2 months with Dr. Lamonte Sakai   I discussed the assessment and treatment plan with the patient. The patient was provided an opportunity to ask questions and all were answered. The patient agreed with the plan and demonstrated an understanding of the instructions.   The patient was advised to call back or seek an in-person evaluation if the symptoms worsen or if the condition fails to improve as anticipated.  I provided 18 minutes of non-face-to-face time during this encounter.   Martyn Ehrich, NP

## 2019-01-13 ENCOUNTER — Encounter (HOSPITAL_COMMUNITY): Payer: Self-pay

## 2019-01-13 ENCOUNTER — Encounter (HOSPITAL_COMMUNITY)
Admission: RE | Admit: 2019-01-13 | Discharge: 2019-01-13 | Disposition: A | Discharge status: Home / Self Care | Payer: Medicare Other | Source: Ambulatory Visit | Attending: Internal Medicine | Admitting: Internal Medicine

## 2019-01-13 ENCOUNTER — Other Ambulatory Visit (HOSPITAL_COMMUNITY)
Admission: RE | Admit: 2019-01-13 | Discharge: 2019-01-13 | Disposition: A | Discharge status: Home / Self Care | Payer: Medicare Other | Source: Ambulatory Visit | Attending: Internal Medicine | Admitting: Internal Medicine

## 2019-01-13 ENCOUNTER — Other Ambulatory Visit: Payer: Self-pay

## 2019-01-13 DIAGNOSIS — K449 Diaphragmatic hernia without obstruction or gangrene: Secondary | ICD-10-CM | POA: Insufficient documentation

## 2019-01-13 DIAGNOSIS — R1310 Dysphagia, unspecified: Secondary | ICD-10-CM | POA: Insufficient documentation

## 2019-01-13 DIAGNOSIS — Z01812 Encounter for preprocedural laboratory examination: Secondary | ICD-10-CM | POA: Insufficient documentation

## 2019-01-13 DIAGNOSIS — Z20828 Contact with and (suspected) exposure to other viral communicable diseases: Secondary | ICD-10-CM | POA: Diagnosis not present

## 2019-01-13 DIAGNOSIS — K222 Esophageal obstruction: Secondary | ICD-10-CM | POA: Insufficient documentation

## 2019-01-13 DIAGNOSIS — K209 Esophagitis, unspecified: Secondary | ICD-10-CM | POA: Insufficient documentation

## 2019-01-13 LAB — SARS CORONAVIRUS 2 (TAT 6-24 HRS): SARS Coronavirus 2: NEGATIVE

## 2019-01-14 ENCOUNTER — Ambulatory Visit: Payer: Medicare Other | Admitting: Primary Care

## 2019-01-14 ENCOUNTER — Encounter: Payer: Self-pay | Admitting: Primary Care

## 2019-01-14 ENCOUNTER — Ambulatory Visit (INDEPENDENT_AMBULATORY_CARE_PROVIDER_SITE_OTHER): Payer: Medicare Other | Admitting: Primary Care

## 2019-01-14 DIAGNOSIS — R0600 Dyspnea, unspecified: Secondary | ICD-10-CM

## 2019-01-14 DIAGNOSIS — R1312 Dysphagia, oropharyngeal phase: Secondary | ICD-10-CM | POA: Diagnosis not present

## 2019-01-14 DIAGNOSIS — R0609 Other forms of dyspnea: Secondary | ICD-10-CM | POA: Diagnosis not present

## 2019-01-14 NOTE — Assessment & Plan Note (Signed)
-   Planning for EGD on 8/28 with GI

## 2019-01-14 NOTE — Patient Instructions (Addendum)
Continue Dulera 2 puffs twice daily  If breathing does not improve with inhaler use or after EGD- consider referral to cardiologist  Follow up on Oct 30th at 10:30 am with [facility]

## 2019-01-14 NOTE — Assessment & Plan Note (Signed)
-   PFTs showed pre-bronch restriction with positive bronchodilator response - Symptoms consistent mild intermittent asthma and physical deconditioning  - Patient reports some improvement with Dulera twice daily (RX sent) - If continues to have dyspnea despite inhaler use consider referral to cardiology  - FU in 2 months with Virginia Nash

## 2019-01-16 ENCOUNTER — Other Ambulatory Visit: Payer: Self-pay

## 2019-01-16 ENCOUNTER — Encounter (HOSPITAL_COMMUNITY): Payer: Self-pay | Admitting: Anesthesiology

## 2019-01-16 ENCOUNTER — Ambulatory Visit (HOSPITAL_COMMUNITY): Payer: Medicare Other | Admitting: Anesthesiology

## 2019-01-16 ENCOUNTER — Ambulatory Visit (HOSPITAL_COMMUNITY)
Admission: RE | Admit: 2019-01-16 | Discharge: 2019-01-16 | Disposition: A | Discharge status: Home / Self Care | Payer: Medicare Other | Attending: Internal Medicine | Admitting: Internal Medicine

## 2019-01-16 ENCOUNTER — Encounter (HOSPITAL_COMMUNITY)
Admission: RE | Disposition: A | Discharge status: Home / Self Care | Payer: Self-pay | Source: Home / Self Care | Attending: Internal Medicine

## 2019-01-16 DIAGNOSIS — R110 Nausea: Secondary | ICD-10-CM | POA: Insufficient documentation

## 2019-01-16 DIAGNOSIS — K449 Diaphragmatic hernia without obstruction or gangrene: Secondary | ICD-10-CM | POA: Diagnosis not present

## 2019-01-16 DIAGNOSIS — R1312 Dysphagia, oropharyngeal phase: Secondary | ICD-10-CM

## 2019-01-16 DIAGNOSIS — K222 Esophageal obstruction: Secondary | ICD-10-CM | POA: Diagnosis not present

## 2019-01-16 DIAGNOSIS — J45909 Unspecified asthma, uncomplicated: Secondary | ICD-10-CM | POA: Insufficient documentation

## 2019-01-16 DIAGNOSIS — K2970 Gastritis, unspecified, without bleeding: Secondary | ICD-10-CM | POA: Diagnosis not present

## 2019-01-16 DIAGNOSIS — K228 Other specified diseases of esophagus: Secondary | ICD-10-CM | POA: Diagnosis not present

## 2019-01-16 DIAGNOSIS — M1990 Unspecified osteoarthritis, unspecified site: Secondary | ICD-10-CM | POA: Insufficient documentation

## 2019-01-16 DIAGNOSIS — K219 Gastro-esophageal reflux disease without esophagitis: Secondary | ICD-10-CM | POA: Diagnosis not present

## 2019-01-16 DIAGNOSIS — Z888 Allergy status to other drugs, medicaments and biological substances status: Secondary | ICD-10-CM | POA: Insufficient documentation

## 2019-01-16 DIAGNOSIS — K3189 Other diseases of stomach and duodenum: Secondary | ICD-10-CM | POA: Diagnosis not present

## 2019-01-16 DIAGNOSIS — G709 Myoneural disorder, unspecified: Secondary | ICD-10-CM | POA: Diagnosis not present

## 2019-01-16 DIAGNOSIS — K30 Functional dyspepsia: Secondary | ICD-10-CM | POA: Insufficient documentation

## 2019-01-16 DIAGNOSIS — R1314 Dysphagia, pharyngoesophageal phase: Secondary | ICD-10-CM | POA: Insufficient documentation

## 2019-01-16 DIAGNOSIS — K210 Gastro-esophageal reflux disease with esophagitis: Secondary | ICD-10-CM | POA: Diagnosis not present

## 2019-01-16 DIAGNOSIS — Q394 Esophageal web: Secondary | ICD-10-CM | POA: Diagnosis not present

## 2019-01-16 HISTORY — PX: ESOPHAGEAL DILATION: SHX303

## 2019-01-16 HISTORY — PX: ESOPHAGOGASTRODUODENOSCOPY (EGD) WITH PROPOFOL: SHX5813

## 2019-01-16 SURGERY — ESOPHAGOGASTRODUODENOSCOPY (EGD) WITH PROPOFOL
Anesthesia: General

## 2019-01-16 MED ORDER — FAMOTIDINE 20 MG PO TABS: 20.0000 mg | ORAL_TABLET | Freq: Every day | ORAL | Status: DC

## 2019-01-16 MED ORDER — PANTOPRAZOLE SODIUM 40 MG PO TBEC: 40.0000 mg | DELAYED_RELEASE_TABLET | Freq: Every day | ORAL | 5 refills | Status: DC

## 2019-01-16 MED ORDER — CHLORHEXIDINE GLUCONATE CLOTH 2 % EX PADS: 6.0000 | MEDICATED_PAD | Freq: Once | CUTANEOUS | Status: DC

## 2019-01-16 MED ORDER — PROPOFOL 500 MG/50ML IV EMUL
INTRAVENOUS | Status: DC | PRN
  Administered 2019-01-16: 150 ug/kg/min via INTRAVENOUS

## 2019-01-16 MED ORDER — LACTATED RINGERS IV SOLN
Freq: Once | INTRAVENOUS | Status: AC
  Administered 2019-01-16: 1000 mL via INTRAVENOUS

## 2019-01-16 MED ORDER — PROPOFOL 10 MG/ML IV BOLUS
INTRAVENOUS | Status: DC | PRN
  Administered 2019-01-16 (×4): 20 mg via INTRAVENOUS

## 2019-01-16 MED ORDER — PROPOFOL 10 MG/ML IV BOLUS
INTRAVENOUS | Status: AC
  Filled 2019-01-16: qty 40

## 2019-01-16 NOTE — Transfer of Care (Signed)
Immediate Anesthesia Transfer of Care Note  Patient: Jennessa Parma Remlinger  Procedure(s) Performed: ESOPHAGOGASTRODUODENOSCOPY (EGD) WITH PROPOFOL (N/A ) ESOPHAGEAL DILATION (N/A )  Patient Location: PACU  Anesthesia Type:General  Level of Consciousness: awake  Airway & Oxygen Therapy: Patient Spontanous Breathing  Post-op Assessment: Report given to RN  Post vital signs: Reviewed and stable  Last Vitals:  Vitals Value Taken Time  BP 98/48 01/16/19 1330  Temp 36.9 C 01/16/19 1330  Pulse 72 01/16/19 1337  Resp 15 01/16/19 1337  SpO2 94 % 01/16/19 1337  Vitals shown include unvalidated device data.  Last Pain:  Vitals:   01/16/19 1330  TempSrc:   PainSc: 0-No pain      Patients Stated Pain Goal: 7 ([id number])  Complications: No apparent anesthesia complications

## 2019-01-16 NOTE — Op Note (Signed)
[facility] Patient Name: Virginia Nash Procedure Date: 01/16/2019 12:03 PM MRN: [id number] Date of Birth: [birth year] Attending MD: Hildred Laser , MD CSN: [id number] Age: 73 Admit Type: Outpatient Procedure:                Upper GI endoscopy Indications:              Esophageal dysphagia, Follow-up of                            gastro-esophageal reflux disease Providers:                Hildred Laser, MD, Hinton Rao, RN, Raphael Gibney, Technician Referring MD:             Koleen Distance. Gottschalk, DO Medicines:                Propofol per Anesthesia Complications:            No immediate complications. Estimated Blood Loss:     Estimated blood loss was minimal. Procedure:                Pre-Anesthesia Assessment:                           - Prior to the procedure, a History and Physical                            was performed, and patient medications and                            allergies were reviewed. The patient's tolerance of                            previous anesthesia was also reviewed. The risks                            and benefits of the procedure and the sedation                            options and risks were discussed with the patient.                            All questions were answered, and informed consent                            was obtained. Prior Anticoagulants: The patient has                            taken no previous anticoagulant or antiplatelet                            agents. ASA Grade Assessment: II - A patient with  mild systemic disease. After reviewing the risks                            and benefits, the patient was deemed in                            satisfactory condition to undergo the procedure.                           After obtaining informed consent, the endoscope was                            passed under direct vision. Throughout the   procedure, the patient's blood pressure, pulse, and                            oxygen saturations were monitored continuously. The                            [id number]) scope was introduced through the                            mouth, and advanced to the second part of duodenum.                            The upper GI endoscopy was accomplished without                            difficulty. The patient tolerated the procedure                            well. Scope In: 1:15:34 PM Scope Out: 1:19:48 PM Total Procedure Duration: 0 hours 4 minutes 14 seconds  Findings:      A web was found in the proximal esophagus.      One benign-appearing, intrinsic mild stenosis was found 34 cm from the       incisors. The stenosis was traversed. The dilation site was examined       following endoscope reinsertion and showed mild mucosal disruption and       no perforation. The scope was withdrawn. Dilation was performed with a       Maloney dilator with mild resistance at 78 Fr. The dilation site was       examined following endoscope reinsertion and showed mild mucosal       disruption, mild improvement in luminal narrowing and no perforation.      Segmental mild mucosal changes characterized by longitudinal markings       were found in the mid esophagus. Biopsies were taken with a cold forceps       for histology. Biopsies were taken with a cold forceps for histology.       The pathology specimen was placed into Bottle Number 2.      LA Grade B (one or more mucosal breaks greater than 5 mm, not extending       between the tops of two mucosal folds) esophagitis with no bleeding was       found 33 to  34 cm from the incisors.      A 2 cm hiatal hernia was present.      Patchy mild inflammation characterized by congestion (edema) and       erosions was found in the gastric antrum and in the prepyloric region of       the stomach. Biopsies were taken with a cold forceps for histology. The        pathology specimen was placed into Bottle Number 1.      The exam of the stomach was otherwise normal.      The duodenal bulb and second portion of the duodenum were normal. Impression:               - Web in the proximal esophagus.                           - Benign-appearing esophageal stenosis. Dilated                           - Longitudinally marked mucosa in the esophagus.                            Biopsied.                           - LA Grade B reflux esophagitis.                           - 2 cm hiatal hernia.                           - Gastritis. Biopsied.                           - Normal duodenal bulb and second portion of the                            duodenum. Moderate Sedation:      Per Anesthesia Care Recommendation:           - Patient has a contact number available for                            emergencies. The signs and symptoms of potential                            delayed complications were discussed with the                            patient. Return to normal activities tomorrow.                            Written discharge instructions were provided to the                            patient.                           - Mechanical soft diet  today.                           - Resume previous diet for 1 day.                           - Continue present medications.                           - Pantoprazole 40 mg po qam.                           - Pepcid OTC 20 mg po qhs.                           - Use Protonix (pantoprazole) 40 mg PO daily.                           - Await pathology results. Procedure Code(s):        --- Professional ---                           [phone], Esophagogastroduodenoscopy, flexible,                            transoral; with biopsy, single or multiple Diagnosis Code(s):        --- Professional ---                           Q39.4, Esophageal web                           K22.2, Esophageal obstruction                            K22.8, Other specified diseases of esophagus                           K21.0, Gastro-esophageal reflux disease with                            esophagitis                           K44.9, Diaphragmatic hernia without obstruction or                            gangrene                           K29.70, Gastritis, unspecified, without bleeding                           R13.14, Dysphagia, pharyngoesophageal phase CPT copyright 2019 American Medical Association. All rights reserved. The codes documented in this report are preliminary and upon coder review may  be revised to meet current compliance requirements. Hildred Laser, MD Hildred Laser, MD 01/16/2019 1:40:29 PM This report has been signed electronically. Number of Addenda: 0

## 2019-01-16 NOTE — Anesthesia Preprocedure Evaluation (Signed)
Anesthesia Evaluation  Patient identified by MRN, date of birth, ID band Patient awake    Reviewed: Allergy & Precautions, NPO status , Patient's Chart, lab work & pertinent test results  Airway Mallampati: II  TM Distance: >3 FB     Dental  (+) Caps, Missing   Pulmonary asthma ,    Pulmonary exam normal breath sounds clear to auscultation       Cardiovascular negative cardio ROS Normal cardiovascular exam     Neuro/Psych  Neuromuscular disease    GI/Hepatic Dysphagia, nausea   Endo/Other    Renal/GU      Musculoskeletal  (+) Arthritis ,   Abdominal   Peds negative pediatric ROS (+)  Hematology negative hematology ROS (+)   Anesthesia Other Findings   Reproductive/Obstetrics                            Anesthesia Physical Anesthesia Plan  ASA: II  Anesthesia Plan: General   Post-op Pain Management:    Induction: Intravenous  PONV Risk Score and Plan:   Airway Management Planned: Natural Airway, Nasal Cannula and Simple Face Mask  Additional Equipment:   Intra-op Plan:   Post-operative Plan:   Informed Consent: I have reviewed the patients History and Physical, chart, labs and discussed the procedure including the risks, benefits and alternatives for the proposed anesthesia with the patient or authorized representative who has indicated his/her understanding and acceptance.     Dental advisory given  Plan Discussed with:   Anesthesia Plan Comments:         Anesthesia Quick Evaluation

## 2019-01-16 NOTE — H&P (Signed)
Virginia Nash is an 74 y.o. female.   Chief Complaint: Patient is here for esophagogastroduodenoscopy with esophageal dilation. HPI: Patient is 73 year old Caucasian female who presents with over 50-year history of dysphagia to solids.  She states she had esophagogram 7 years ago and was normal.  She has had difficulty with meats and rice.  She says when she eats meat she cuts into small pieces.  She points to suprasternal area as site of bolus obstruction.  She has had few episodes where she had to regurgitate food bolus for relief and on other occasions food eventually passes down.  She says she has had intermittent heartburn about a year but has been frequent over the last few months.  She took omeprazole for 3 months but did not help.  She therefore stopped this medication.  She denies abdominal pain or melena.  She also complains of nausea which is usually experience when she wakes up in the morning.  No history of vomiting or weight loss.  Past Medical History:  Diagnosis Date  . Allergy   . Arthritis   . Asthma    as child  . Biliary dyskinesia   . Blood transfusion without reported diagnosis   . Cancer ([place])   . Cataract     Past Surgical History:  Procedure Laterality Date  . ABDOMINAL HYSTERECTOMY    . CHOLECYSTECTOMY N/A 04/12/2015   Procedure: LAPAROSCOPIC CHOLECYSTECTOMY;  Surgeon: Judeth Horn, MD;  Location: [place];  Service: General;  Laterality: N/A;  . COLONOSCOPY N/A 07/31/2017   Procedure: COLONOSCOPY;  Surgeon: Rogene Houston, MD;  [facility];  Service: Endoscopy;  Laterality: N/A;  830  . DILATION AND CURETTAGE OF UTERUS    . EYE SURGERY     cataract with lens replacement  . TONSILLECTOMY      Family History  Problem Relation Age of Onset  . Arthritis Mother   . Cancer Mother   . Cancer Father   . Hearing loss Sister   . Stroke Maternal Grandmother   . Kidney disease Paternal Grandmother    Social History:  reports that  she has never smoked. She has never used smokeless tobacco. She reports that she does not drink alcohol or use drugs.  Allergies:  Allergies  Allergen Reactions  . Omeprazole Shortness Of Breath  . Codeine Nausea Only  . Other     Peas - rash Asparagus - rash Celery - rash  . Peanut Oil Rash and Other (See Comments)    Congestion with nasal scabs     Medications Prior to Admission  Medication Sig Dispense Refill  . Homeopathic Products (THERAWORX RELIEF EX) Apply 1 application topically daily as needed (pain).    Marland Kitchen loratadine (CLARITIN) 10 MG tablet Take 10 mg by mouth daily.    . Magnesium 250 MG TABS Take 250 mg by mouth daily.     . mometasone-formoterol (DULERA) 100-5 MCG/ACT AERO Inhale 2 puffs into the lungs 2 (two) times daily. 1 g 6  . Multiple Vitamin (MULTIVITAMIN WITH MINERALS) TABS tablet Take 1 tablet by mouth daily.      No results found for this or any previous visit (from the past 48 hour(s)). No results found.  ROS  Blood pressure 131/62, temperature 98.2 F (36.8 C), temperature source Oral, resp. rate 18, height 5\' 1"  (1.549 m), weight 86.2 kg, SpO2 97 %. Physical Exam  Constitutional: She appears well-developed and well-nourished.  HENT:  Mouth/Throat: Oropharynx is clear and  moist.  Eyes: Conjunctivae are normal. No scleral icterus.  Neck: No thyromegaly present.  Cardiovascular: Normal rate, regular rhythm and normal heart sounds.  No murmur heard. Respiratory: Effort normal and breath sounds normal.  GI:  Abdomen is full.  She has Pfannenstiel scar.  Abdomen is soft and nontender with organomegaly or masses.  Musculoskeletal:        General: No edema.  Lymphadenopathy:    She has no cervical adenopathy.  Neurological: She is alert.  Skin: Skin is warm and dry.     Assessment/Plan Esophageal dysphagia in a patient with frequent heartburn. Esophagogastroduodenoscopy with esophageal dilation.  Hildred Laser, MD 01/16/2019, 12:53 PM

## 2019-01-16 NOTE — Discharge Instructions (Signed)
No aspirin or NSAIDs for 72 hours. Resume usual medications as before. Pantoprazole 40 mg by mouth 30 minutes before breakfast daily. Pepcid/famotidine OTC 20 mg by mouth daily at bedtime. Soft diet for 24 hours and thereafter usual diet. No driving for 24 hours. Patient will call with biopsy results.   Monitored Anesthesia Care, Care After These instructions provide you with information about caring for yourself after your procedure. Your health care provider may also give you more specific instructions. Your treatment has been planned according to current medical practices, but problems sometimes occur. Call your health care provider if you have any problems or questions after your procedure. What can I expect after the procedure? After your procedure, you may:  Feel sleepy for several hours.  Feel clumsy and have poor balance for several hours.  Feel forgetful about what happened after the procedure.  Have poor judgment for several hours.  Feel nauseous or vomit.  Have a sore throat if you had a breathing tube during the procedure. Follow these instructions at home: For at least 24 hours after the procedure:      Have a responsible adult stay with you. It is important to have someone help care for you until you are awake and alert.  Rest as needed.  Do not: ? Participate in activities in which you could fall or become injured. ? Drive. ? Use heavy machinery. ? Drink alcohol. ? Take sleeping pills or medicines that cause drowsiness. ? Make important decisions or sign legal documents. ? Take care of children on your own. Eating and drinking  Follow the diet that is recommended by your health care provider.  If you vomit, drink water, juice, or soup when you can drink without vomiting.  Make sure you have little or no nausea before eating solid foods. General instructions  Take over-the-counter and prescription medicines only as told by your health care  provider.  If you have sleep apnea, surgery and certain medicines can increase your risk for breathing problems. Follow instructions from your health care provider about wearing your sleep device: ? Anytime you are sleeping, including during daytime naps. ? While taking prescription pain medicines, sleeping medicines, or medicines that make you drowsy.  If you smoke, do not smoke without supervision.  Keep all follow-up visits as told by your health care provider. This is important. Contact a health care provider if:  You keep feeling nauseous or you keep vomiting.  You feel light-headed.  You develop a rash.  You have a fever. Get help right away if:  You have trouble breathing. Summary  For several hours after your procedure, you may feel sleepy and have poor judgment.  Have a responsible adult stay with you for at least 24 hours or until you are awake and alert. This information is not intended to replace advice given to you by your health care provider. Make sure you discuss any questions you have with your health care provider. Document Released: 08/28/2015 Document Revised: 08/05/2017 Document Reviewed: 08/28/2015 Elsevier Patient Education  2020 [facility], Adult, Care After This sheet gives you information about how to care for yourself after your procedure. Your health care provider may also give you more specific instructions. If you have problems or questions, contact your health care provider. What can I expect after the procedure? After the procedure, it is common to have:  A sore throat.  Mild stomach pain or discomfort.  Bloating.  Nausea. Follow these instructions at home:  Follow instructions from your health care provider about what to eat or drink after your procedure.  Return to your normal activities as told by your health care provider. Ask your health care provider what activities are safe for you.  Take over-the-counter  and prescription medicines only as told by your health care provider.  Do not drive for 24 hours if you were given a sedative during your procedure.  Keep all follow-up visits as told by your health care provider. This is important. Contact a health care provider if you have:  A sore throat that lasts longer than one day.  Trouble swallowing. Get help right away if:  You vomit blood or your vomit looks like coffee grounds.  You have: ? A fever. ? Bloody, black, or tarry stools. ? A severe sore throat or you cannot swallow. ? Difficulty breathing. ? Severe pain in your chest or abdomen. Summary  After the procedure, it is common to have a sore throat, mild stomach discomfort, bloating, and nausea.  Do not drive for 24 hours if you were given a sedative during the procedure.  Follow instructions from your health care provider about what to eat or drink after your procedure.  Return to your normal activities as told by your health care provider. This information is not intended to replace advice given to you by your health care provider. Make sure you discuss any questions you have with your health care provider. Document Released: 11/06/2011 Document Revised: 10/29/2017 Document Reviewed: 10/07/2017 Elsevier Patient Education  2020 [employer].

## 2019-01-16 NOTE — Anesthesia Postprocedure Evaluation (Signed)
Anesthesia Post Note  Patient: Virginia Nash  Procedure(s) Performed: ESOPHAGOGASTRODUODENOSCOPY (EGD) WITH PROPOFOL (N/A ) ESOPHAGEAL DILATION (N/A )  Patient location during evaluation: PACU Anesthesia Type: General Level of consciousness: awake and alert and oriented Pain management: pain level controlled Vital Signs Assessment: post-procedure vital signs reviewed and stable Respiratory status: spontaneous breathing Cardiovascular status: blood pressure returned to baseline and stable Postop Assessment: no apparent nausea or vomiting Anesthetic complications: no     Last Vitals:  Vitals:   01/16/19 1229 01/16/19 1330  BP: 131/62 (!) 98/48  Pulse:  76  Resp: 18 19  Temp: 36.8 C 36.9 C  SpO2: 97% 96%    Last Pain:  Vitals:   01/16/19 1330  TempSrc:   PainSc: 0-No pain                 Alexander Mcauley

## 2019-01-20 ENCOUNTER — Encounter (HOSPITAL_COMMUNITY): Payer: Self-pay | Admitting: Internal Medicine

## 2019-01-20 ENCOUNTER — Ambulatory Visit: Payer: Medicare Other | Admitting: Primary Care

## 2019-01-22 ENCOUNTER — Other Ambulatory Visit: Payer: Self-pay

## 2019-01-23 ENCOUNTER — Encounter: Payer: Self-pay | Admitting: Family Medicine

## 2019-01-23 ENCOUNTER — Ambulatory Visit (INDEPENDENT_AMBULATORY_CARE_PROVIDER_SITE_OTHER): Payer: Medicare Other | Admitting: Family Medicine

## 2019-01-23 VITALS — BP 121/72 | HR 70 | Temp 97.2°F | Ht 61.0 in | Wt 192.0 lb

## 2019-01-23 DIAGNOSIS — K2100 Gastro-esophageal reflux disease with esophagitis, without bleeding: Secondary | ICD-10-CM

## 2019-01-23 DIAGNOSIS — K449 Diaphragmatic hernia without obstruction or gangrene: Secondary | ICD-10-CM | POA: Diagnosis not present

## 2019-01-23 NOTE — Patient Instructions (Addendum)

## 2019-01-23 NOTE — Progress Notes (Signed)
Subjective: CC: f/u SOB PCP: [name], DO [id number] C Egger is a 73 y.o. female presenting to clinic today for:  1.  Shortness of breath Patient reports shortness of breath has greatly improved since both seeing the pulmonologist and gastroenterologist.  She notes that the inhaler did seem to help some but would ultimately seem to work the best was seeing the gastroenterologist and having her esophagus stretched.  She is currently taking Protonix 40 mg and Pepcid 20 mg nightly.  She notes that she still has occasional intermittent abdominal pain but this is greatly reduced from previous.  She is tolerating p.o. intake without difficulty.  She is watching what she eats to reduce acid production. Her esophageal stretch and EGD was performed last Friday.  She sees Dr. Laural Golden.   ROS: Per HPI  Allergies  Allergen Reactions  . Codeine Nausea Only  . Other     Peas - rash Asparagus - rash Celery - rash  . Omeprazole Other (See Comments)    Did not work  . Peanut Oil Rash and Other (See Comments)    Congestion with nasal scabs    Past Medical History:  Diagnosis Date  . Allergy   . Arthritis   . Asthma    as child  . Biliary dyskinesia   . Blood transfusion without reported diagnosis   . Cancer ([place])   . Cataract     Current Outpatient Medications:  .  famotidine (PEPCID) 20 MG tablet, Take 1 tablet (20 mg total) by mouth at bedtime., Disp: , Rfl:  .  Homeopathic Products [facility] RELIEF EX), Apply 1 application topically daily as needed (pain)., Disp: , Rfl:  .  loratadine (CLARITIN) 10 MG tablet, Take 10 mg by mouth daily., Disp: , Rfl:  .  Magnesium 250 MG TABS, Take 250 mg by mouth daily. , Disp: , Rfl:  .  Multiple Vitamin (MULTIVITAMIN WITH MINERALS) TABS tablet, Take 1 tablet by mouth daily., Disp: , Rfl:  .  pantoprazole (PROTONIX) 40 MG tablet, Take 1 tablet (40 mg total) by mouth daily before breakfast., Disp: 30 tablet, Rfl: 5 .   mometasone-formoterol (DULERA) 100-5 MCG/ACT AERO, Inhale 2 puffs into the lungs 2 (two) times daily. (Patient not taking: Reported on 01/23/2019), Disp: 1 g, Rfl: 6 Social History   Socioeconomic History  . Marital status: Married    Spouse name: [name]  . Number of children: 3  . Years of education: 12th Grade  . Highest education level: High school graduate  Occupational History  . Occupation: Retired  Scientific laboratory technician  . Financial resource strain: Not hard at all  . Food insecurity    Worry: Never true    Inability: Never true  . Transportation needs    Medical: No    Non-medical: No  Tobacco Use  . Smoking status: Never Smoker  . Smokeless tobacco: Never Used  Substance and Sexual Activity  . Alcohol use: No  . Drug use: No  . Sexual activity: Yes  Lifestyle  . Physical activity    Days per week: 0 days    Minutes per session: 0 min  . Stress: Not at all  Relationships  . Social connections    Talks on phone: More than three times a week    Gets together: More than three times a week    Attends religious service: More than 4 times per year    Active member of club or organization: Yes    Attends meetings  of clubs or organizations: Never    Relationship status: Widowed  . Intimate partner violence    Fear of current or ex partner: No    Emotionally abused: No    Physically abused: No    Forced sexual activity: No  Other Topics Concern  . Not on file  Social History Narrative  . Not on file   Family History  Problem Relation Age of Onset  . Arthritis Mother   . Cancer Mother   . Cancer Father   . Hearing loss Sister   . Stroke Maternal Grandmother   . Kidney disease Paternal Grandmother     Objective: Office vital signs reviewed. BP 121/72   Pulse 70   Temp (!) 97.2 F (36.2 C) (Temporal)   Ht 5\' 1"  (1.549 m)   Wt 192 lb (87.1 kg)   SpO2 94%   BMI 36.28 kg/m   Physical Examination:  General: Awake, alert, well nourished, No acute distress HEENT:  Normal, sclera white Cardio: regular rate and rhythm, S1S2 heard, no murmurs appreciated Pulm: clear to auscultation bilaterally, no wheezes, rhonchi or rales; normal work of breathing on room air  Assessment/ Plan: 73 y.o. female   1. Gastroesophageal reflux disease with esophagitis Doing well with PPI and H2 blocker.  She continues to follow-up with Dr. Laural Golden and seems to be overall improved after esophageal stretch.  She can follow-up in 6 months for annual physical exam.  She will have her flu shot administered at her pharmacy in [place].  2. Hiatal hernia Stable.  At some point if it become symptomatic could consider referral to surgery for intervention.  Weight loss recommended to reduce pressures.  Handout provided.   No orders of the defined types were placed in this encounter.  No orders of the defined types were placed in this encounter.    [name], DO [place] [phone]

## 2019-01-27 ENCOUNTER — Telehealth: Payer: Self-pay | Admitting: Primary Care

## 2019-01-27 NOTE — Telephone Encounter (Signed)
PA denial received from [facility] for Dulera 100.  Per [facility] fax Ruthe Mannan is not on Patient's preferred med list.  Preferred inhalers are Symbicort and Breo.  Patient last OV was 01/14/19, with Benjamine Mola, NP.  Will route message to Eustaquio Maize, NP to advise on [facility] appeal or changing inhaler

## 2019-01-27 NOTE — Telephone Encounter (Signed)
Symbicort 80 2 puffs twice daily. Please take dulera off her list

## 2019-01-27 NOTE — Telephone Encounter (Signed)
ATC Patient.  Left message for her to call back.

## 2019-01-28 NOTE — Telephone Encounter (Signed)
ATC Patient.  LMTCB.  Message routed to triage to follow up

## 2019-02-02 DIAGNOSIS — Z23 Encounter for immunization: Secondary | ICD-10-CM | POA: Diagnosis not present

## 2019-02-10 ENCOUNTER — Other Ambulatory Visit: Payer: Self-pay

## 2019-02-10 ENCOUNTER — Encounter (INDEPENDENT_AMBULATORY_CARE_PROVIDER_SITE_OTHER): Payer: Medicare Other | Admitting: Ophthalmology

## 2019-02-10 DIAGNOSIS — H33021 Retinal detachment with multiple breaks, right eye: Secondary | ICD-10-CM | POA: Diagnosis not present

## 2019-02-10 DIAGNOSIS — H43813 Vitreous degeneration, bilateral: Secondary | ICD-10-CM

## 2019-02-10 DIAGNOSIS — H04123 Dry eye syndrome of bilateral lacrimal glands: Secondary | ICD-10-CM | POA: Diagnosis not present

## 2019-02-10 DIAGNOSIS — H33321 Round hole, right eye: Secondary | ICD-10-CM | POA: Diagnosis not present

## 2019-02-10 DIAGNOSIS — H40013 Open angle with borderline findings, low risk, bilateral: Secondary | ICD-10-CM | POA: Diagnosis not present

## 2019-02-10 DIAGNOSIS — Z961 Presence of intraocular lens: Secondary | ICD-10-CM | POA: Diagnosis not present

## 2019-02-17 ENCOUNTER — Other Ambulatory Visit: Payer: Self-pay

## 2019-02-17 ENCOUNTER — Encounter (INDEPENDENT_AMBULATORY_CARE_PROVIDER_SITE_OTHER): Payer: Medicare Other | Admitting: Ophthalmology

## 2019-02-17 DIAGNOSIS — H33301 Unspecified retinal break, right eye: Secondary | ICD-10-CM | POA: Diagnosis not present

## 2019-03-16 ENCOUNTER — Encounter (HOSPITAL_COMMUNITY): Payer: Self-pay | Admitting: Internal Medicine

## 2019-03-20 ENCOUNTER — Other Ambulatory Visit: Payer: Self-pay

## 2019-03-20 ENCOUNTER — Ambulatory Visit (INDEPENDENT_AMBULATORY_CARE_PROVIDER_SITE_OTHER): Payer: Medicare Other | Admitting: Emergency Medicine

## 2019-03-20 ENCOUNTER — Encounter: Payer: Self-pay | Admitting: Emergency Medicine

## 2019-03-20 DIAGNOSIS — K2100 Gastro-esophageal reflux disease with esophagitis, without bleeding: Secondary | ICD-10-CM

## 2019-03-20 DIAGNOSIS — J310 Chronic rhinitis: Secondary | ICD-10-CM | POA: Diagnosis not present

## 2019-03-20 DIAGNOSIS — J4520 Mild intermittent asthma, uncomplicated: Secondary | ICD-10-CM

## 2019-03-20 DIAGNOSIS — R053 Chronic cough: Secondary | ICD-10-CM

## 2019-03-20 DIAGNOSIS — R05 Cough: Secondary | ICD-10-CM

## 2019-03-20 MED ORDER — ALBUTEROL SULFATE HFA 108 (90 BASE) MCG/ACT IN AERS: 2.0000 | INHALATION_SPRAY | RESPIRATORY_TRACT | 5 refills | Status: DC | PRN

## 2019-03-20 NOTE — Assessment & Plan Note (Signed)
Continue treatment of her GERD and allergic rhinitis

## 2019-03-20 NOTE — Progress Notes (Signed)
Subjective:    Patient ID: Virginia Nash, female    DOB: 1946/02/02, 73 y.o.   MRN: [id number]  HPI Virginia Nash is 73, never smoker, has a history of obesity, allergic rhinitis previously on allergy shots, esophageal reflux, uterine CA (treated).  She reports that she had childhood asthma.  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    She is referred today for persistent cough and progressive exertional dyspnea. Worse over the last 6-8 months. Notices it with cooking, walking, chores. Has gained some wt over the last several months - 20 lbs. Has developed increased dry cough, non-productive. Has occasional seasonal allergies. She has a lot of GERD, has tried PPI before without much change in either the GERD or the cough. No real wheeze in addition to the cough. She can have acute sharp substernal pain, sometimes at rest. Had a Cards w/u at [facility] 2016 >> reassuring.   ROV 03/20/2019 --Virginia Nash is 73, returns today for her history of cough and dyspnea in the setting of allergic rhinitis, esophageal reflux.  She also has a remote history of TB (treated).  A repeat CT chest done 11/18/2018 did not show any sequela.  We performed pulmonary function testing in July which I reviewed, show moderately severe obstruction with a vigorous bronchodilator response consistent with asthma.  She was started on [facility] following those PFT, but only took a couple times and then stopped. It was not covered by her insurance so she never filled script. She has access to her husband's albuterol > has never used to date. She was seen by GI, had esophageal dilation, is on protonix and pepcid. She is on loratadine. Her exertional tolerance remains somewhat limited, she is able to shop for groceries. No wheeze.    Review of Systems  HENT: Positive for trouble swallowing.   Respiratory: Positive for chest tightness and shortness of breath.    Cardiovascular: Positive for chest pain.  Musculoskeletal: Positive for arthralgias and joint swelling.   Past Medical History:  Diagnosis Date  . Allergy   . Arthritis   . Asthma    as child  . Biliary dyskinesia   . Blood transfusion without reported diagnosis   . Cancer ([place])   . Cataract      Family History  Problem Relation Age of Onset  . Arthritis Mother   . Cancer Mother   . Cancer Father   . Hearing loss Sister   . Stroke Maternal Grandmother   . Kidney disease Paternal Grandmother      Social History   Socioeconomic History  . Marital status: Married    Spouse name: [name]  . Number of children: 3  . Years of education: 12th Grade  . Highest education level: High school graduate  Occupational History  . Occupation: Retired  Scientific laboratory technician  . Financial resource strain: Not hard at all  . Food insecurity    Worry: Never true    Inability: Never true  . Transportation needs    Medical: No    Non-medical: No  Tobacco Use  . Smoking status: Never Smoker  . Smokeless tobacco: Never Used  Substance and Sexual Activity  . Alcohol use: No  . Drug use: No  . Sexual activity: Yes  Lifestyle  . Physical activity    Days per week: 0 days    Minutes per session: 0 min  . Stress: Not  at all  Relationships  . Social connections    Talks on phone: More than three times a week    Gets together: More than three times a week    Attends religious service: More than 4 times per year    Active member of club or organization: Yes    Attends meetings of clubs or organizations: Never    Relationship status: Widowed  . Intimate partner violence    Fear of current or ex partner: No    Emotionally abused: No    Physically abused: No    Forced sexual activity: No  Other Topics Concern  . Not on file  Social History Narrative  . Not on file  she lives in [place], [place] native.    Allergies  Allergen Reactions  . Codeine Nausea Only  . Other     Peas - rash Asparagus  - rash Celery - rash  . Omeprazole Other (See Comments)    Did not work  . Peanut Oil Rash and Other (See Comments)    Congestion with nasal scabs      Outpatient Medications Prior to Visit  Medication Sig Dispense Refill  . famotidine (PEPCID) 20 MG tablet Take 1 tablet (20 mg total) by mouth at bedtime.    . Homeopathic Products (THERAWORX RELIEF EX) Apply 1 application topically daily as needed (pain).    Marland Kitchen loratadine (CLARITIN) 10 MG tablet Take 10 mg by mouth daily.    . Magnesium 250 MG TABS Take 250 mg by mouth daily.     . Multiple Vitamin (MULTIVITAMIN WITH MINERALS) TABS tablet Take 1 tablet by mouth daily.    . pantoprazole (PROTONIX) 40 MG tablet Take 1 tablet (40 mg total) by mouth daily before breakfast. 30 tablet 5  . mometasone-formoterol (DULERA) 100-5 MCG/ACT AERO Inhale 2 puffs into the lungs 2 (two) times daily. (Patient not taking: Reported on 01/23/2019) 1 g 6   No facility-administered medications prior to visit.         Objective:   Physical Exam Vitals:   03/20/19 1017  BP: 120/68  Pulse: 76  SpO2: 97%  Weight: 196 lb (88.9 kg)  Height: 5\' 1"  (1.549 m)   Gen: Pleasant, overwt woman, in no distress,  normal affect  ENT: No lesions,  mouth clear,  oropharynx clear, no postnasal drip  Neck: No JVD, no stridor  Lungs: No use of accessory muscles, no crackles or wheezing on normal respiration, no wheeze on forced expiration  Cardiovascular: RRR, heart sounds normal, no murmur or gallops, no peripheral edema  Musculoskeletal: No deformities, no cyanosis or clubbing  Neuro: alert, awake, non focal  Skin: Warm, no lesions or rash        Assessment & Plan:  Mild intermittent asthma She did not take Dulera long enough to have been off we will therapeutic trial.  All the same she is not clear that she wants to be on a maintenance medication at this time.  Her symptoms are intermittent.  I think we can try to manage her with albuterol as needed.  We  will send a prescription for her today.  Try to control her potential exacerbating factors including gastroesophageal reflux and chronic rhinitis.  Flu shot is up-to-date  We will not restart Dulera at this time. Please use albuterol 2 puffs up to every 4 hours if needed for shortness of breath, chest tightness, wheezing.  We will send a prescription for this to your pharmacy. Follow with Dr. Lamonte Sakai  in 12 months or sooner if you have any problems.   Gastroesophageal reflux disease with esophagitis Continue your pantoprazole and Pepcid as you have been taking them.  Chronic rhinitis Continue your loratadine (Claritin) as you have been taking it.  Chronic cough Continue treatment of her GERD and allergic rhinitis  Baltazar Apo, MD, PhD 03/20/2019, 10:59 AM [facility] [phone] or if no answer [phone]

## 2019-03-20 NOTE — Assessment & Plan Note (Signed)
Continue your pantoprazole and Pepcid as you have been taking them.

## 2019-03-20 NOTE — Assessment & Plan Note (Signed)
She did not take Dulera long enough to have been off we will therapeutic trial.  All the same she is not clear that she wants to be on a maintenance medication at this time.  Her symptoms are intermittent.  I think we can try to manage her with albuterol as needed.  We will send a prescription for her today.  Try to control her potential exacerbating factors including gastroesophageal reflux and chronic rhinitis.  Flu shot is up-to-date  We will not restart Dulera at this time. Please use albuterol 2 puffs up to every 4 hours if needed for shortness of breath, chest tightness, wheezing.  We will send a prescription for this to your pharmacy. Follow with Dr. Lamonte Sakai in 12 months or sooner if you have any problems.

## 2019-03-20 NOTE — Patient Instructions (Addendum)
We will not restart Dulera at this time. Please use albuterol 2 puffs up to every 4 hours if needed for shortness of breath, chest tightness, wheezing.  We will send a prescription for this to your pharmacy. Continue your pantoprazole and Pepcid as you have been taking them. Continue your loratadine (Claritin) as you have been taking it. Follow with Dr. Lamonte Sakai in 12 months or sooner if you have any problems.

## 2019-03-20 NOTE — Assessment & Plan Note (Signed)
Continue your loratadine (Claritin) as you have been taking it.

## 2019-06-22 ENCOUNTER — Encounter (INDEPENDENT_AMBULATORY_CARE_PROVIDER_SITE_OTHER): Payer: Medicare Other | Admitting: Ophthalmology

## 2019-06-22 ENCOUNTER — Other Ambulatory Visit: Payer: Self-pay

## 2019-06-22 DIAGNOSIS — H33301 Unspecified retinal break, right eye: Secondary | ICD-10-CM

## 2019-06-22 DIAGNOSIS — H43813 Vitreous degeneration, bilateral: Secondary | ICD-10-CM

## 2019-07-11 ENCOUNTER — Other Ambulatory Visit (INDEPENDENT_AMBULATORY_CARE_PROVIDER_SITE_OTHER): Payer: Self-pay | Admitting: Internal Medicine

## 2019-07-13 ENCOUNTER — Other Ambulatory Visit (HOSPITAL_COMMUNITY): Payer: Self-pay | Admitting: Family Medicine

## 2019-07-13 DIAGNOSIS — Z1231 Encounter for screening mammogram for malignant neoplasm of breast: Secondary | ICD-10-CM

## 2019-07-22 ENCOUNTER — Other Ambulatory Visit: Payer: Self-pay

## 2019-07-23 ENCOUNTER — Encounter: Payer: Self-pay | Admitting: Family Medicine

## 2019-07-23 ENCOUNTER — Ambulatory Visit (INDEPENDENT_AMBULATORY_CARE_PROVIDER_SITE_OTHER): Payer: Medicare Other | Admitting: Family Medicine

## 2019-07-23 VITALS — BP 129/74 | HR 73 | Temp 96.9°F | Ht 61.0 in | Wt 194.0 lb

## 2019-07-23 DIAGNOSIS — Z780 Asymptomatic menopausal state: Secondary | ICD-10-CM

## 2019-07-23 DIAGNOSIS — M5481 Occipital neuralgia: Secondary | ICD-10-CM

## 2019-07-23 DIAGNOSIS — K2100 Gastro-esophageal reflux disease with esophagitis, without bleeding: Secondary | ICD-10-CM

## 2019-07-23 DIAGNOSIS — E669 Obesity, unspecified: Secondary | ICD-10-CM | POA: Diagnosis not present

## 2019-07-23 MED ORDER — PANTOPRAZOLE SODIUM 40 MG PO TBEC: DELAYED_RELEASE_TABLET | ORAL | 3 refills | Status: DC

## 2019-07-23 MED ORDER — PANTOPRAZOLE SODIUM 40 MG PO TBEC: DELAYED_RELEASE_TABLET | ORAL | 0 refills | Status: DC

## 2019-07-23 NOTE — Progress Notes (Signed)
Subjective: CC: 8-monthcheckup for GERD PCP: GJanora Norlander DO HQQP:YPPJKC Shells is a 74y.o. female presenting to clinic today for:  1.  GERD Patient reports compliance with pantoprazole 40 mg daily and Pepcid 20 mg q. Evening.-  She saw the gastroenterologist, Dr RLaural Golden who performed an EGD with esophageal dilation.  She notes that symptoms have been quite a bit better since this procedure.  She occasionally still has a little symptomology but she says that this is very mild compared to what it was.  She is tolerating p.o. intake without difficulty.  No nausea, vomiting.  No unplanned weight loss.  2.  Head/neck pain Patient reports to the right posterior aspect of the head and notes that this is occasionally painful.  She describes the pain as a slight shooting pain that can last a few minutes to a few hours.  She does admit to wearing glasses that may be a little tight as she does feel an indentation in her scalp from the glasses.  Does not report any visual changes, sensory changes.  ROS: Per HPI  Allergies  Allergen Reactions  . Codeine Nausea Only  . Other     Peas - rash Asparagus - rash Celery - rash  . Omeprazole Other (See Comments)    Did not work  . Peanut Oil Rash and Other (See Comments)    Congestion with nasal scabs    Past Medical History:  Diagnosis Date  . Allergy   . Arthritis   . Asthma    as child  . Biliary dyskinesia   . Blood transfusion without reported diagnosis   . [place]   . Cataract     Current Outpatient Medications:  .  albuterol (VENTOLIN HFA) 108 (90 Base) MCG/ACT inhaler, Inhale 2 puffs into the lungs every 4 (four) hours as needed for wheezing or shortness of breath., Disp: 18 g, Rfl: 5 .  famotidine (PEPCID) 20 MG tablet, Take 1 tablet (20 mg total) by mouth at bedtime., Disp: , Rfl:  .  Homeopathic Products ([facility]), Apply 1 application topically daily as needed (pain)., Disp: , Rfl:  .  loratadine  (CLARITIN) 10 MG tablet, Take 10 mg by mouth daily., Disp: , Rfl:  .  Magnesium 250 MG TABS, Take 250 mg by mouth daily. , Disp: , Rfl:  .  Multiple Vitamin (MULTIVITAMIN WITH MINERALS) TABS tablet, Take 1 tablet by mouth daily., Disp: , Rfl:  .  pantoprazole (PROTONIX) 40 MG tablet, TAKE 1 TABLET BY MOUTH ONCE DAILY BEFORE BREAKFAST, Disp: 30 tablet, Rfl: 0 Social History   Socioeconomic History  . Marital status: Married    Spouse name: KLanny Hurst . Number of children: 3  . Years of education: 12th Grade  . Highest education level: High school graduate  Occupational History  . Occupation: Retired  Tobacco Use  . Smoking status: Never Smoker  . Smokeless tobacco: Never Used  Substance and Sexual Activity  . Alcohol use: No  . Drug use: No  . Sexual activity: Yes  Other Topics Concern  . Not on file  Social History Narrative  . Not on file   Social Determinants of Health   Financial Resource Strain:   . Difficulty of Paying Living Expenses: Not on file  Food Insecurity:   . Worried About RCharity fundraiserin the Last Year: Not on file  . Ran Out of Food in the Last Year: Not on file  Transportation Needs:   .  Lack of Transportation (Medical): Not on file  . Lack of Transportation (Non-Medical): Not on file  Physical Activity:   . Days of Exercise per Week: Not on file  . Minutes of Exercise per Session: Not on file  Stress:   . Feeling of Stress : Not on file  Social Connections:   . Frequency of Communication with Friends and Family: Not on file  . Frequency of Social Gatherings with Friends and Family: Not on file  . Attends Religious Services: Not on file  . Active Member of Clubs or Organizations: Not on file  . Attends Archivist Meetings: Not on file  . Marital Status: Not on file  Intimate Partner Violence:   . Fear of Current or Ex-Partner: Not on file  . Emotionally Abused: Not on file  . Physically Abused: Not on file  . Sexually Abused: Not on  file   Family History  Problem Relation Age of Onset  . Arthritis Mother   . Cancer Mother   . Cancer Father   . Hearing loss Sister   . Stroke Maternal Grandmother   . Kidney disease Paternal Grandmother     Objective: Office vital signs reviewed. BP 129/74   Pulse 73   Temp (!) 96.9 F (36.1 C) (Temporal)   Ht '5\' 1"'$  (1.549 m)   Wt 194 lb (88 kg)   SpO2 97%   BMI 36.66 kg/m   Physical Examination:  General: Awake, alert, well nourished, No acute distress HEENT: Normal; there are palpable indentations where her glasses set.  Otherwise, no palpable abnormalities.  Pain is not reproducible. Cardio: regular rate and rhythm, S1S2 heard, no murmurs appreciated Pulm: clear to auscultation bilaterally, no wheezes, rhonchi or rales; normal work of breathing on room air Extremities: warm, well perfused, No edema, cyanosis or clubbing; +2 pulses bilaterally  Assessment/ Plan: 74 y.o. female   1. Gastroesophageal reflux disease with esophagitis without hemorrhage Controlled and improved after esophageal stretch.  I reviewed Dr. Olevia Perches notes and recommendations. - pantoprazole (PROTONIX) 40 MG tablet; TAKE 1 TABLET BY MOUTH ONCE DAILY BEFORE BREAKFAST  Dispense: 90 tablet; Refill: 3  2. Occipital neuralgia of right side I suspect a component of occipital neuralgia given her reports of symptoms.  I did stress that she may consider having her glasses adjusted so that it is not pressing on this area.  Handout was provided.  She will contact me if symptoms worsen.  3. Obesity (BMI 35.0-39.9 without comorbidity) She will come in for fasting labs prior to her next visit.  Plan for annual physical exam at next visit - Lipid panel; Future - CMP14+EGFR; Future - Bayer DCA Hb A1c Waived; Future  4. Asymptomatic postmenopausal estrogen deficiency - DG WR[facility] DEXA; Future   No orders of the defined types were placed in this encounter.  No orders of the defined types were placed in this  encounter.    [name], DO [place] [phone]

## 2019-07-23 NOTE — Patient Instructions (Signed)
Occipital Neuralgia  Occipital neuralgia is a type of headache that causes brief episodes of very bad pain in the back of your head. Pain from occipital neuralgia may spread (radiate) to other parts of your head. These headaches may be caused by irritation of the nerves that leave your spinal cord high up in your neck, just below the base of your skull (occipital nerves). Your occipital nerves transmit sensations from the back of your head, the top of your head, and the areas behind your ears. What are the causes? This condition can occur without any known cause (primary headache syndrome). In other cases, this condition is caused by pressure on or irritation of one of the two occipital nerves. Pressure and irritation may be due to:  Muscle spasm in the neck.  Neck injury.  Wear and tear of the vertebrae in the neck (osteoarthritis).  Disease of the disks that separate the vertebrae.  Swollen blood vessels that put pressure on the occipital nerves.  Infections.  Tumors.  Diabetes. What are the signs or symptoms? This condition causes brief burning, stabbing, electric, shocking, or shooting pain which can radiate to the top of the head. It can happen on one side or both sides of the head. It can also cause:  Pain behind the eye.  Pain triggered by neck movement or hair brushing.  Scalp tenderness.  Aching in the back of the head between episodes of very bad pain.  Pain gets worse with exposure to bright lights. How is this diagnosed? There is no test that diagnoses this condition. Your health care provider may diagnose this condition based on a physical exam and your symptoms. Other tests may be done, such as:  Imaging studies of the brain and neck (cervical spine), such as an MRI or CT scan. These look for causes of pinched nerves.  Applying pressure to the nerves in the neck to try to re-create the pain.  Injection of numbing medicine into the occipital nerve areas to see if  pain goes away (diagnostic nerve block). How is this treated? Treatment for this condition may begin with simple measures, such as:  Rest.  Massage.  Applying heat or cold on the area.  Over-the-counter pain relievers. If these measures do not work, you may need other treatments, including:  Medicines, such as: ? Prescription-strength anti-inflammatory medicines. ? Muscle relaxants. ? Anti-seizure medicines, which can relieve pain. ? Antidepressants, which can relieve pain. ? Injected medicines, such as medicines that numb the area (local anesthetic) and steroids.  Pulsed radiofrequency ablation. This is when wires are implanted to deliver electrical impulses that block pain signals from the occipital nerve.  Surgery to relieve nerve pressure.  Physical therapy. Follow these instructions at home: Pain management      Avoid any activities that cause pain.  Rest when you have an attack of pain.  Try gentle massage to relieve pain.  Try a different pillow or sleeping position.  If directed, apply heat to the affected area as told by your health care provider. Use the heat source that your health care provider recommends, such as a moist heat pack or a heating pad. ? Place a towel between your skin and the heat source. ? Leave the heat on for 20-30 minutes. ? Remove the heat if your skin turns bright red. This is especially important if you are unable to feel pain, heat, or cold. You may have a greater risk of getting burned.  If directed, apply ice to the   back of the head and neck area as told by your health care provider. ? Put ice in a plastic bag. ? Place a towel between your skin and the bag. ? Leave the ice on for 20 minutes, 2-3 times per day. General instructions  Take over-the-counter and prescription medicines only as told by your health care provider.  Avoid things that make your symptoms worse, such as bright lights.  Try to stay active. Get regular  exercise that does not cause pain. Ask your health care provider to suggest safe exercises for you.  Work with a physical therapist to learn stretching exercises you can do at home.  Practice good posture.  Keep all follow-up visits as told by your health care provider. This is important. Contact a health care provider if:  Your medicine is not working.  You have new or worsening symptoms. Get help right away if:  You have very bad head pain that does not go away.  You have a sudden change in vision, balance, or speech. Summary  Occipital neuralgia is a type of headache that causes brief episodes of very bad pain in the back of your head.  Pain from occipital neuralgia may spread (radiate) to other parts of your head.  Treatment for this condition includes rest, massage, and medicines. This information is not intended to replace advice given to you by your health care provider. Make sure you discuss any questions you have with your health care provider. Document Revised: 04/23/2017 Document Reviewed: 07/12/2016 Elsevier Patient Education  2020 [employer]vier Inc.  

## 2019-07-29 ENCOUNTER — Ambulatory Visit (INDEPENDENT_AMBULATORY_CARE_PROVIDER_SITE_OTHER): Payer: Medicare Other

## 2019-07-29 ENCOUNTER — Other Ambulatory Visit: Payer: Self-pay

## 2019-07-29 DIAGNOSIS — Z780 Asymptomatic menopausal state: Secondary | ICD-10-CM | POA: Diagnosis not present

## 2019-07-29 DIAGNOSIS — M85852 Other specified disorders of bone density and structure, left thigh: Secondary | ICD-10-CM | POA: Diagnosis not present

## 2019-08-03 ENCOUNTER — Other Ambulatory Visit: Payer: Self-pay

## 2019-08-03 ENCOUNTER — Ambulatory Visit (HOSPITAL_COMMUNITY)
Admission: RE | Admit: 2019-08-03 | Discharge: 2019-08-03 | Disposition: A | Discharge status: Home / Self Care | Payer: Medicare Other | Source: Ambulatory Visit | Attending: Family Medicine | Admitting: Family Medicine

## 2019-08-03 DIAGNOSIS — Z1231 Encounter for screening mammogram for malignant neoplasm of breast: Secondary | ICD-10-CM | POA: Diagnosis not present

## 2019-08-05 ENCOUNTER — Telehealth: Payer: Self-pay | Admitting: Family Medicine

## 2019-08-05 ENCOUNTER — Other Ambulatory Visit (INDEPENDENT_AMBULATORY_CARE_PROVIDER_SITE_OTHER): Payer: Self-pay | Admitting: Gastroenterology

## 2019-08-05 DIAGNOSIS — K2100 Gastro-esophageal reflux disease with esophagitis, without bleeding: Secondary | ICD-10-CM

## 2019-08-05 NOTE — Chronic Care Management (AMB) (Signed)
  Chronic Care Management   Note  08/05/2019 Name: Virginia Nash MRN: [id number] DOB: [birth year]  Virginia Nash is a 74 y.o. year old female who is a primary care patient of [name], DO. I reached out to Virginia Nash by phone today in response to a referral sent by Ms. Lannette Donath Hegarty's health plan.     Ms. Jurich was given information about Chronic Care Management services today including:  1. CCM service includes personalized support from designated clinical staff supervised by her physician, including individualized plan of care and coordination with other care providers 2. 24/7 contact phone numbers for assistance for urgent and routine care needs. 3. Service will only be billed when office clinical staff spend 20 minutes or more in a month to coordinate care. 4. Only one practitioner may furnish and bill the service in a calendar month. 5. The patient may stop CCM services at any time (effective at the end of the month) by phone call to the office staff. 6. The patient will be responsible for cost sharing (co-pay) of up to 20% of the service fee (after annual deductible is met).  Patient did not agree to enrollment in care management services and does not wish to consider at this time.  Follow up plan: The patient has been provided with contact information for the care management team and has been advised to call with any health related questions or concerns.   [place], [place] [address] Direct Dial: [phone] [email or web address]: [email or web address]

## 2019-08-19 IMAGING — DX DG CHEST 2V
2 series · 2 of 2 positions shown · non-contrast
Comparison: 04/07/2015

CLINICAL DATA: Cough.  Remote history of tuberculosis.

EXAM:
CHEST - 2 VIEW

[chest pa]
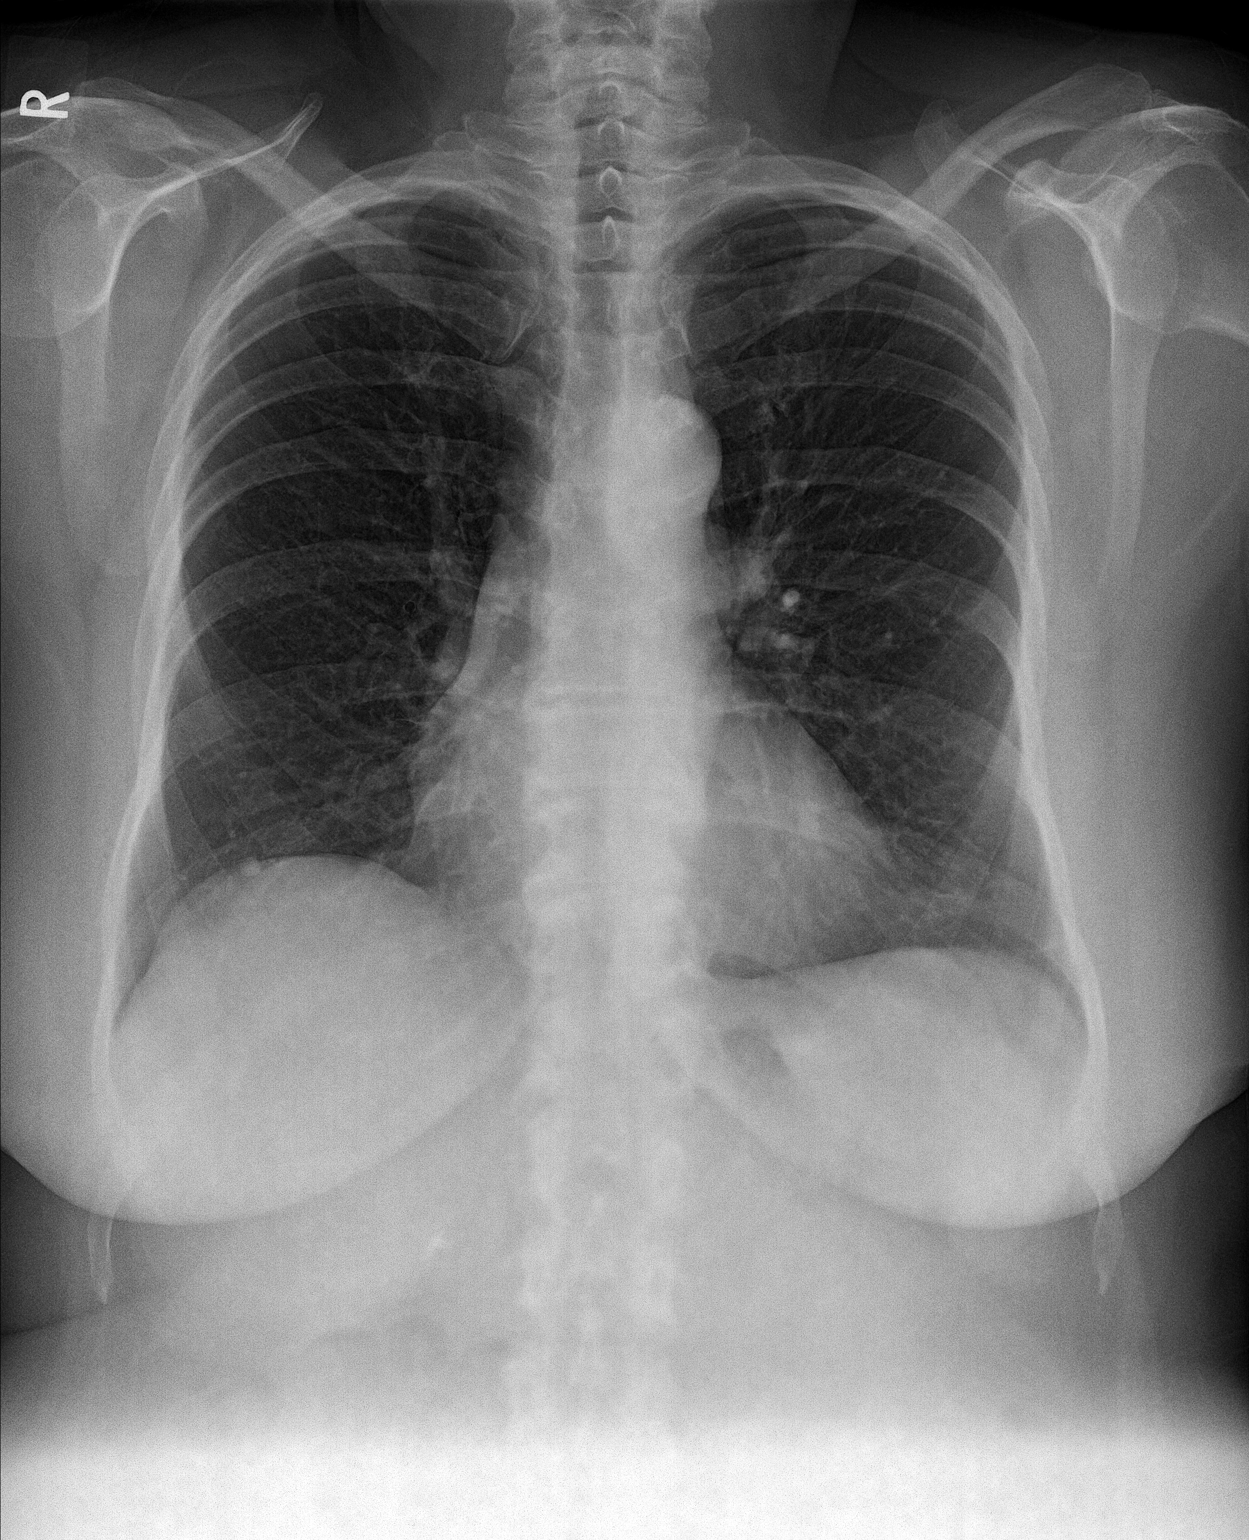

[chest lat]
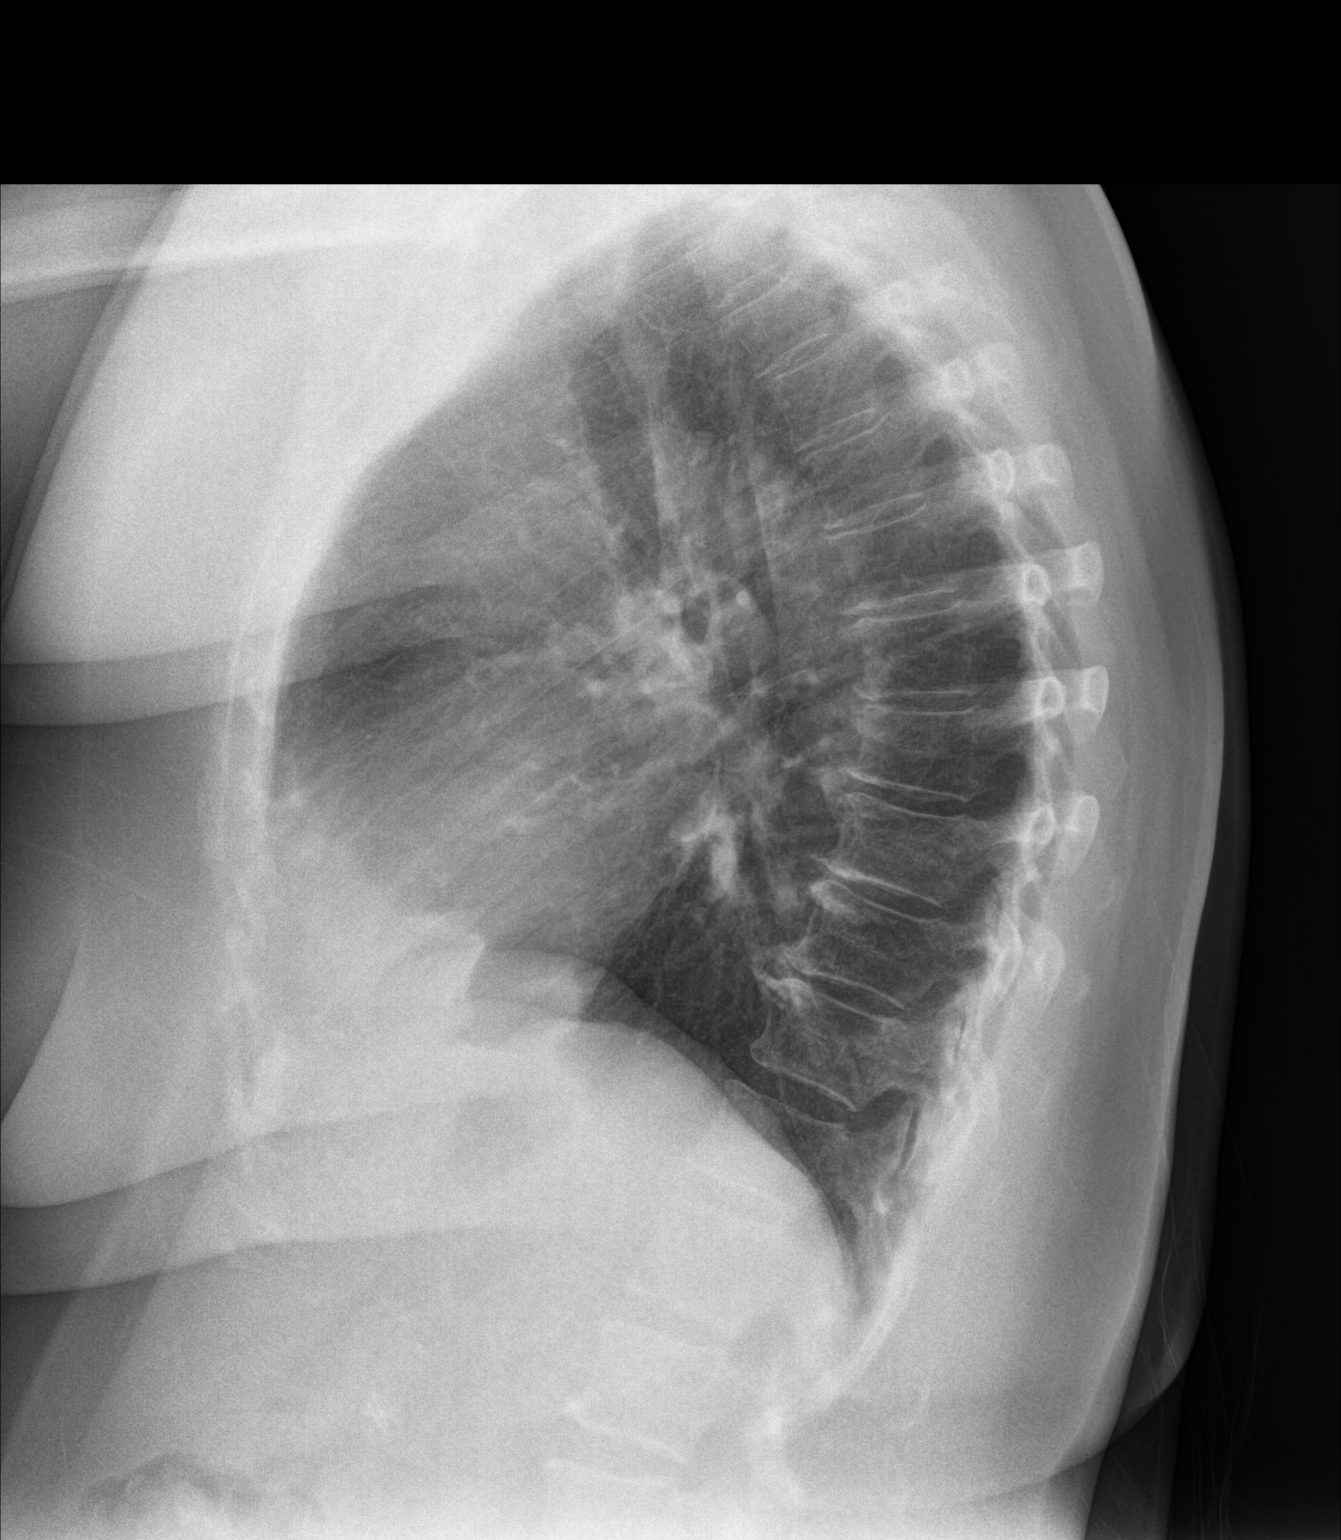

[2 of 2 positions shown; findings below may reference images not displayed]

FINDINGS: The cardiomediastinal silhouette is unchanged and within normal
limits. No airspace consolidation, edema, pleural effusion, or
pneumothorax is identified. A calcified granuloma is again noted in
the right lung base. Right upper quadrant abdominal surgical clips
are noted. No acute osseous abnormality is seen.
IMPRESSION: No active cardiopulmonary disease.

## 2019-09-09 ENCOUNTER — Other Ambulatory Visit: Payer: Self-pay | Admitting: Family Medicine

## 2019-09-09 DIAGNOSIS — K2100 Gastro-esophageal reflux disease with esophagitis, without bleeding: Secondary | ICD-10-CM

## 2019-09-18 ENCOUNTER — Other Ambulatory Visit: Payer: Self-pay | Admitting: Family Medicine

## 2019-09-18 DIAGNOSIS — K2100 Gastro-esophageal reflux disease with esophagitis, without bleeding: Secondary | ICD-10-CM

## 2019-09-21 ENCOUNTER — Other Ambulatory Visit: Payer: Self-pay | Admitting: Family Medicine

## 2019-09-21 DIAGNOSIS — K2100 Gastro-esophageal reflux disease with esophagitis, without bleeding: Secondary | ICD-10-CM

## 2019-09-22 ENCOUNTER — Telehealth: Payer: Self-pay | Admitting: Family Medicine

## 2019-09-22 DIAGNOSIS — K2100 Gastro-esophageal reflux disease with esophagitis, without bleeding: Secondary | ICD-10-CM

## 2019-09-22 MED ORDER — PANTOPRAZOLE SODIUM 40 MG PO TBEC: DELAYED_RELEASE_TABLET | ORAL | 4 refills | Status: DC

## 2019-09-22 NOTE — Telephone Encounter (Signed)
Prescription sent to pharmacy, patient aware 

## 2019-09-22 NOTE — Telephone Encounter (Signed)
  Prescription Request  09/22/2019  What is the name of the medication or equipment? pantoprazole (PROTONIX) 40 MG tablet    Have you contacted your pharmacy to request a refill? (if applicable) Yes, she has one pill left  Which pharmacy would you like this sent to? [facility]    Patient notified that their request is being sent to the clinical staff for review and that they should receive a response within 2 business days.

## 2019-11-26 NOTE — Telephone Encounter (Signed)
Pt had OV with RB 02/2019 and was told to use albuterol inhaler prn and not to restart dulera. No Rx for Symbicort was sent to pharmacy for pt at this time. Nothing further needed.

## 2019-12-24 ENCOUNTER — Telehealth (INDEPENDENT_AMBULATORY_CARE_PROVIDER_SITE_OTHER): Payer: Self-pay | Admitting: Gastroenterology

## 2019-12-24 DIAGNOSIS — K2100 Gastro-esophageal reflux disease with esophagitis, without bleeding: Secondary | ICD-10-CM

## 2019-12-24 MED ORDER — PANTOPRAZOLE SODIUM 40 MG PO TBEC: DELAYED_RELEASE_TABLET | ORAL | 4 refills | Status: DC

## 2019-12-24 NOTE — Telephone Encounter (Signed)
Patient is due for 1 year f/up - 90 day supply sent to pharmacy as requested.

## 2020-01-12 ENCOUNTER — Ambulatory Visit (INDEPENDENT_AMBULATORY_CARE_PROVIDER_SITE_OTHER): Payer: Medicare Other | Admitting: *Deleted

## 2020-01-12 DIAGNOSIS — Z0000 Encounter for general adult medical examination without abnormal findings: Secondary | ICD-10-CM | POA: Diagnosis not present

## 2020-01-12 NOTE — Progress Notes (Signed)
MEDICARE ANNUAL WELLNESS VISIT  01/12/2020   Provider calling from [facility], patient participating in call from home.   Telephone Visit Disclaimer This Medicare AWV was conducted by telephone due to national recommendations for restrictions regarding the COVID-19 Pandemic (e.g. social distancing).  I verified, using two identifiers, that I am speaking with Virginia Nash or their authorized healthcare agent. I discussed the limitations, risks, security, and privacy concerns of performing an evaluation and management service by telephone and the potential availability of an in-person appointment in the future. The patient expressed understanding and agreed to proceed.   Subjective:  Virginia Nash is a 74 y.o. female patient of Virginia Norlander, DO who had a Medicare Annual Wellness Visit today via telephone. Nolie is retired and lives with her husband Virginia Nash. They have 3 grown children. She reports that she socially active and does interact with friends/family regularly. She is not physically active and enjoys sewing and crafting.  Patient Care Team: Virginia Norlander, DO as PCP - General (Family Medicine) Gari Crown, MD (Unknown Physician Specialty)  Advanced Directives 01/12/2020 01/07/2019 12/31/2017 12/25/2016 04/12/2015 04/05/2015  Does Patient Have a Medical Advance Directive? Yes Yes Yes Yes Yes Yes  Type of Advance Directive Living will;Out of facility DNR (pink MOST or yellow form) [place];Living will [place];Living will [place];Living will - [place];Living will  Does patient want to make changes to medical advance directive? No - Patient declined No - Patient declined Yes (MAU/Ambulatory/Procedural Areas - Information given) - - -  Copy of [place] in Chart? - No - copy requested No - copy requested No - copy requested No - copy requested Yes    Hospital Utilization Over  the Past 12 Months: # of hospitalizations or ER visits: 0 # of surgeries: 0  Review of Systems    Patient reports that her overall health is unchanged compared to last year.  History obtained from the patient and patient chart.  Patient Reported Readings (BP, Pulse, CBG, Weight, etc) none  Pain Assessment Pain : No/denies pain     Current Medications & Allergies (verified) Allergies as of 01/12/2020      Reactions   Codeine Nausea Only   Other    Peas - rash Asparagus - rash Celery - rash   Omeprazole Other (See Comments)   Did not work   Peanut Oil Rash, Other (See Comments)   Congestion with nasal scabs      Medication List       Accurate as of January 12, 2020 10:04 AM. If you have any questions, ask your nurse or doctor.        albuterol 108 (90 Base) MCG/ACT inhaler Commonly known as: VENTOLIN HFA Inhale 2 puffs into the lungs every 4 (four) hours as needed for wheezing or shortness of breath.   famotidine 20 MG tablet Commonly known as: Pepcid Take 1 tablet (20 mg total) by mouth at bedtime.   loratadine 10 MG tablet Commonly known as: CLARITIN Take 10 mg by mouth daily.   Magnesium 250 MG Tabs Take 250 mg by mouth daily.   multivitamin with minerals Tabs tablet Take 1 tablet by mouth daily.   pantoprazole 40 MG tablet Commonly known as: PROTONIX Take 1 tablet once daily before breakfast.   THERAWORX RELIEF EX Apply 1 application topically daily as needed (pain).       History (reviewed): Past Medical History:  Diagnosis Date  . Allergy   . Arthritis   . Asthma    as child  . Biliary dyskinesia   . Blood transfusion without reported diagnosis   . Cancer ([place])   . Cataract    Past Surgical History:  Procedure Laterality Date  . ABDOMINAL HYSTERECTOMY    . CHOLECYSTECTOMY N/A 04/12/2015   Procedure: LAPAROSCOPIC CHOLECYSTECTOMY;  Surgeon: Judeth Horn, MD;  Location: [facility];  Service: General;  Laterality: N/A;  .  COLONOSCOPY N/A 07/31/2017   Procedure: COLONOSCOPY;  Surgeon: Rogene Houston, MD;  [facility];  Service: Endoscopy;  Laterality: N/A;  830  . DILATION AND CURETTAGE OF UTERUS    . ESOPHAGEAL DILATION N/A 01/16/2019   Procedure: ESOPHAGEAL DILATION;  Surgeon: Rogene Houston, MD;  [facility];  Service: Endoscopy;  Laterality: N/A;  . ESOPHAGOGASTRODUODENOSCOPY (EGD) WITH PROPOFOL N/A 01/16/2019   Procedure: ESOPHAGOGASTRODUODENOSCOPY (EGD) WITH PROPOFOL;  Surgeon: Rogene Houston, MD;  [facility];  Service: Endoscopy;  Laterality: N/A;  145pm  . EYE SURGERY     cataract with lens replacement  . TONSILLECTOMY     Family History  Problem Relation Age of Onset  . Arthritis Mother   . Cancer Mother   . Cancer Father   . Hearing loss Sister   . Stroke Maternal Grandmother   . Kidney disease Paternal Grandmother   . Thyroid disease Daughter    Social History   Socioeconomic History  . Marital status: Married    Spouse name: Virginia Nash  . Number of children: 3  . Years of education: 12th Grade  . Highest education level: High school graduate  Occupational History  . Occupation: Retired  Tobacco Use  . Smoking status: Never Smoker  . Smokeless tobacco: Never Used  Vaping Use  . Vaping Use: Never used  Substance and Sexual Activity  . Alcohol use: No  . Drug use: No  . Sexual activity: Yes  Other Topics Concern  . Not on file  Social History Narrative   Retired, married, 3 grown children. Enjoys sewing and crafting.    Social Determinants of Health   Financial Resource Strain:   . Difficulty of Paying Living Expenses: Not on file  Food Insecurity:   . Worried About Charity fundraiser in the Last Year: Not on file  . Ran Out of Food in the Last Year: Not on file  Transportation Needs:   . Lack of Transportation (Medical): Not on file  . Lack of Transportation (Non-Medical): Not on file  Physical Activity:   . Days of Exercise per Week:  Not on file  . Minutes of Exercise per Session: Not on file  Stress:   . Feeling of Stress : Not on file  Social Connections:   . Frequency of Communication with Friends and Family: Not on file  . Frequency of Social Gatherings with Friends and Family: Not on file  . Attends Religious Services: Not on file  . Active Member of Clubs or Organizations: Not on file  . Attends Archivist Meetings: Not on file  . Marital Status: Not on file    Activities of Daily Living In your present state of health, do you have any difficulty performing the following activities: 01/12/2020  Hearing? N  Vision? Y  Difficulty concentrating or making decisions? Y  Comment concentrating  Walking or climbing stairs? N  Dressing or bathing? N  Doing errands, shopping? Y  Comment  i do not Physiological scientist and eating ? N  Using the Toilet? N  In the past six months, have you accidently leaked urine? N  Do you have problems with loss of bowel control? N  Managing your Medications? N  Managing your Finances? N  Housekeeping or managing your Housekeeping? N  Some recent data might be hidden    Patient Education/ Literacy How often do you need to have someone help you when you read instructions, pamphlets, or other written materials from your doctor or pharmacy?: 1 - Never What is the last grade level you completed in school?: 12  Exercise Current Exercise Habits: The patient does not participate in regular exercise at present, Exercise limited by: None identified  Diet Patient reports consuming 3 meals a day and 3 snack(s) a day Patient reports that her primary diet is: Regular Patient reports that she does have regular access to food.   Depression Screen PHQ 2/9 Scores 01/12/2020 07/23/2019 01/23/2019 07/22/2018 07/11/2018 04/07/2018 01/16/2018  PHQ - 2 Score 4 0 1 0 2 0 0  PHQ- 9 Score 6 2 4 5 9  - -     Fall Risk Fall Risk  01/12/2020 07/23/2019 01/23/2019 07/22/2018 07/11/2018  Falls in the  past year? 0 0 0 0 0     Objective:  Desare C Finigan seemed alert and oriented and she participated appropriately during our telephone visit.  Blood Pressure Weight BMI  BP Readings from Last 3 Encounters:  07/23/19 129/74  03/20/19 120/68  01/23/19 121/72   Wt Readings from Last 3 Encounters:  07/23/19 194 lb (88 kg)  03/20/19 196 lb (88.9 kg)  01/23/19 192 lb (87.1 kg)   BMI Readings from Last 1 Encounters:  07/23/19 36.66 kg/m    *Unable to obtain current vital signs, weight, and BMI due to telephone visit type  Hearing/Vision  . Jaquaya did not seem to have difficulty with hearing/understanding during the telephone conversation . Reports that she has had a formal eye exam by an eye care professional within the past year . Reports that she has not had a formal hearing evaluation within the past year *Unable to fully assess hearing and vision during telephone visit type  Cognitive Function: 6CIT Screen 01/12/2020 01/07/2019  What Year? 0 points 0 points  What month? 0 points 0 points  What time? 0 points 0 points  Count back from 20 0 points 0 points  Months in reverse 0 points 0 points  Repeat phrase 0 points 0 points  Total Score 0 0   (Normal:0-7, Significant for Dysfunction: >8)  Normal Cognitive Function Screening: Yes   Immunization & Health Maintenance Record Immunization History  Administered Date(s) Administered  . Influenza, High Dose Seasonal PF 02/02/2019, 02/10/2019  . Influenza,inj,Quad PF,6+ Mos 07/23/2018  . Influenza,inj,quad, With Preservative 02/17/2018  . Influenza-Unspecified 03/02/2015, 03/16/2016, 01/30/2017  . Moderna SARS-COVID-2 Vaccination 06/20/2019, 07/20/2019  . Pneumococcal Conjugate-13 10/21/2013  . Pneumococcal Polysaccharide-23 02/15/2015  . Tdap 01/23/2019, 02/10/2019    Health Maintenance  Topic Date Due  . INFLUENZA VACCINE  12/20/2019  . MAMMOGRAM  08/02/2021  . COLONOSCOPY  08/01/2027  . TETANUS/TDAP  02/09/2029  .  DEXA SCAN  Completed  . COVID-19 Vaccine  Completed  . Hepatitis C Screening  Completed  . PNA vac Low Risk Adult  Completed       Assessment  This is a routine wellness examination for Sanah C Peral.  Health Maintenance: Due or Overdue Health Maintenance Due  Topic Date Due  . INFLUENZA VACCINE  12/20/2019    Lannette Donath Jelley does not need a referral for Community Assistance: Care Management:   no Social Work:    no Prescription Assistance:  no Nutrition/Diabetes Education:  no   Plan:  Personalized Goals Goals Addressed            This Visit's Progress   . Patient Stated       01/12/2020 AWV Goal: Exercise for General Health   Patient will verbalize understanding of the benefits of increased physical activity:  Exercising regularly is important. It will improve your overall fitness, flexibility, and endurance.  Regular exercise also will improve your overall health. It can help you control your weight, reduce stress, and improve your bone density.  Over the next year, patient will increase physical activity as tolerated with a goal of at least 150 minutes of moderate physical activity per week.   You can tell that you are exercising at a moderate intensity if your heart starts beating faster and you start breathing faster but can still hold a conversation.  Moderate-intensity exercise ideas include:  Walking 1 mile (1.6 km) in about 15 minutes  Biking  Hiking  Golfing  Dancing  Water aerobics  Patient will verbalize understanding of everyday activities that increase physical activity by providing examples like the following: ? Yard work, such as: ? Pushing a Conservation officer, nature ? Raking and bagging leaves ? Washing your car ? Pushing a stroller ? Shoveling snow ? Gardening ? Washing windows or floors  Patient will be able to explain general safety guidelines for exercising:   Before you start a new exercise program, talk with your health care  provider.  Do not exercise so much that you hurt yourself, feel dizzy, or get very short of breath.  Wear comfortable clothes and wear shoes with good support.  Drink plenty of water while you exercise to prevent dehydration or heat stroke.  Work out until your breathing and your heartbeat get faster.       Personalized Health Maintenance & Screening Recommendations    Lung Cancer Screening Recommended: no (Low Dose CT Chest recommended if Age 59-80 years, 30 pack-year currently smoking OR have quit w/in past 15 years) Hepatitis C Screening recommended: no HIV Screening recommended: no  Advanced Directives: Written information was not prepared per patient's request.  Referrals & Orders No orders of the defined types were placed in this encounter.   Follow-up Plan . Follow-up with Virginia Norlander, DO as planned   I have personally reviewed and noted the following in the patient's chart:   . Medical and social history . Use of alcohol, tobacco or illicit drugs  . Current medications and supplements . Functional ability and status . Nutritional status . Physical activity . Advanced directives . List of other physicians . Hospitalizations, surgeries, and ER visits in previous 12 months . Vitals . Screenings to include cognitive, depression, and falls . Referrals and appointments  In addition, I have reviewed and discussed with Lannette Donath Schnelle certain preventive protocols, quality metrics, and best practice recommendations. A written personalized care plan for preventive services as well as general preventive health recommendations is available and can be mailed to the patient at her request.      Baldomero Lamy, LPN  8/34/1960

## 2020-01-26 ENCOUNTER — Ambulatory Visit: Payer: Medicare Other | Admitting: Family Medicine

## 2020-03-02 ENCOUNTER — Other Ambulatory Visit: Payer: Self-pay

## 2020-03-02 ENCOUNTER — Encounter: Payer: Self-pay | Admitting: Family Medicine

## 2020-03-02 ENCOUNTER — Ambulatory Visit (INDEPENDENT_AMBULATORY_CARE_PROVIDER_SITE_OTHER): Payer: Medicare Other | Admitting: Family Medicine

## 2020-03-02 VITALS — BP 138/74 | HR 73 | Temp 97.5°F | Ht 61.0 in | Wt 186.2 lb

## 2020-03-02 DIAGNOSIS — Z23 Encounter for immunization: Secondary | ICD-10-CM

## 2020-03-02 DIAGNOSIS — F4321 Adjustment disorder with depressed mood: Secondary | ICD-10-CM

## 2020-03-02 DIAGNOSIS — Z0000 Encounter for general adult medical examination without abnormal findings: Secondary | ICD-10-CM

## 2020-03-02 DIAGNOSIS — R0600 Dyspnea, unspecified: Secondary | ICD-10-CM

## 2020-03-02 DIAGNOSIS — E669 Obesity, unspecified: Secondary | ICD-10-CM | POA: Diagnosis not present

## 2020-03-02 DIAGNOSIS — F4320 Adjustment disorder, unspecified: Secondary | ICD-10-CM

## 2020-03-02 DIAGNOSIS — R0609 Other forms of dyspnea: Secondary | ICD-10-CM

## 2020-03-02 NOTE — Progress Notes (Addendum)
 Virginia Nash is a 74 y.o. female presents to office today for annual physical exam examination.    Concerns today include: 1.  Grief reaction Patient reports the passing of her spouse within the last couple of months.  She has been grieving a.  Her family has been well supportive.  In fact, she is trying to adjust to living alone.  She has not reached out to grief counselor.  She continues to be intermittently tearful.  She does report depressive symptoms but is trying to be stoic about this.  2.  Dyspnea on exertion Patient reports that she has dyspnea on exertion that is chronic.  She wonders if this has to do with her weight.  She has not tried using the albuterol inhaler before physical activity or before going outside.  Marital status: Widowed, Substance use: none Diet: fair, Exercise: no structured Last colonoscopy: UTD Last mammogram: UTD Last pap smear: n/a Refills needed today: none Immunizations needed: Flu Vaccine: Y  Past Medical History:  Diagnosis Date  . Allergy   . Arthritis   . Asthma    as child  . Biliary dyskinesia   . Blood transfusion without reported diagnosis   . Cancer ([facility])   . Cataract    Social History   Socioeconomic History  . Marital status: Married    Spouse name: Keith  . Number of children: 3  . Years of education: 12th Grade  . Highest education level: High school graduate  Occupational History  . Occupation: Retired  Tobacco Use  . Smoking status: Never Smoker  . Smokeless tobacco: Never Used  Vaping Use  . Vaping Use: Never used  Substance and Sexual Activity  . Alcohol use: No  . Drug use: No  . Sexual activity: Yes  Other Topics Concern  . Not on file  Social History Narrative   Retired, married, 3 grown children. Enjoys sewing and crafting.    Social Determinants of Health   Financial Resource Strain:   . Difficulty of Paying Living Expenses: Not on file  Food Insecurity:   . Worried About Running Out of Food in  the Last Year: Not on file  . Ran Out of Food in the Last Year: Not on file  Transportation Needs:   . Lack of Transportation (Medical): Not on file  . Lack of Transportation (Non-Medical): Not on file  Physical Activity:   . Days of Exercise per Week: Not on file  . Minutes of Exercise per Session: Not on file  Stress:   . Feeling of Stress : Not on file  Social Connections:   . Frequency of Communication with Friends and Family: Not on file  . Frequency of Social Gatherings with Friends and Family: Not on file  . Attends Religious Services: Not on file  . Active Member of Clubs or Organizations: Not on file  . Attends Club or Organization Meetings: Not on file  . Marital Status: Not on file  Intimate Partner Violence:   . Fear of Current or Ex-Partner: Not on file  . Emotionally Abused: Not on file  . Physically Abused: Not on file  . Sexually Abused: Not on file   Past Surgical History:  Procedure Laterality Date  . ABDOMINAL HYSTERECTOMY    . CHOLECYSTECTOMY N/A 04/12/2015   Procedure: LAPAROSCOPIC CHOLECYSTECTOMY;  Surgeon: James Wyatt, MD;  Location: [facility];  Service: General;  Laterality: N/A;  . COLONOSCOPY N/A 07/31/2017   Procedure: COLONOSCOPY;  Surgeon: Rehman, Najeeb   U, MD;  [facility];  Service: Endoscopy;  Laterality: N/A;  830  . DILATION AND CURETTAGE OF UTERUS    . ESOPHAGEAL DILATION N/A 01/16/2019   Procedure: ESOPHAGEAL DILATION;  Surgeon: Rogene Houston, MD;  [facility];  Service: Endoscopy;  Laterality: N/A;  . ESOPHAGOGASTRODUODENOSCOPY (EGD) WITH PROPOFOL N/A 01/16/2019   Procedure: ESOPHAGOGASTRODUODENOSCOPY (EGD) WITH PROPOFOL;  Surgeon: Rogene Houston, MD;  [facility];  Service: Endoscopy;  Laterality: N/A;  145pm  . EYE SURGERY     cataract with lens replacement  . TONSILLECTOMY     Family History  Problem Relation Age of Onset  . Arthritis Mother   . Cancer Mother   . Cancer Father   .  Hearing loss Sister   . Stroke Maternal Grandmother   . Kidney disease Paternal Grandmother   . Thyroid disease Daughter     Current Outpatient Medications:  .  albuterol (VENTOLIN HFA) 108 (90 Base) MCG/ACT inhaler, Inhale 2 puffs into the lungs every 4 (four) hours as needed for wheezing or shortness of breath., Disp: 18 g, Rfl: 5 .  famotidine (PEPCID) 20 MG tablet, Take 1 tablet (20 mg total) by mouth at bedtime., Disp: , Rfl:  .  Homeopathic Products [facility] RELIEF EX), Apply 1 application topically daily as needed (pain)., Disp: , Rfl:  .  loratadine (CLARITIN) 10 MG tablet, Take 10 mg by mouth daily., Disp: , Rfl:  .  Magnesium 250 MG TABS, Take 250 mg by mouth daily. , Disp: , Rfl:  .  Multiple Vitamin (MULTIVITAMIN WITH MINERALS) TABS tablet, Take 1 tablet by mouth daily., Disp: , Rfl:  .  pantoprazole (PROTONIX) 40 MG tablet, Take 1 tablet once daily before breakfast., Disp: 30 tablet, Rfl: 4  Allergies  Allergen Reactions  . Codeine Nausea Only  . Other     Peas - rash Asparagus - rash Celery - rash  . Omeprazole Other (See Comments)    Did not work  . Peanut Oil Rash and Other (See Comments)    Congestion with nasal scabs      ROS: Review of Systems Pertinent items noted in HPI and remainder of comprehensive ROS otherwise negative.    Physical exam BP 138/74   Pulse 73   Temp (!) 97.5 F (36.4 C)   Ht 5' 1" (1.549 m)   Wt 186 lb 3.2 oz (84.5 kg)   SpO2 95%   BMI 35.18 kg/m  General appearance: alert, cooperative, appears stated age and no distress Head: Normocephalic, without obvious abnormality, atraumatic Eyes: negative findings: lids and lashes normal, conjunctivae and sclerae normal, corneas clear and pupils equal, round, reactive to light and accomodation Ears: normal TM's and external ear canals both ears Nose: Nares normal. Septum midline. Mucosa normal. No drainage or sinus tenderness. Throat: lips, mucosa, and tongue normal; teeth and gums  normal Neck: no adenopathy, supple, symmetrical, trachea midline and thyroid not enlarged, symmetric, no tenderness/mass/nodules Back: symmetric, no curvature. ROM normal. No CVA tenderness. Lungs: clear to auscultation bilaterally Heart: regular rate and rhythm, S1, S2 normal, no murmur, click, rub or gallop Abdomen: nondistended Extremities: extremities normal, atraumatic, no cyanosis or edema Pulses: 2+ and symmetric Skin: Skin color, texture, turgor normal. No rashes or lesions Lymph nodes: Cervical, supraclavicular, and axillary nodes normal. Neurologic: Grossly normal Psych: Tearful. Depression screen [facility] 2/9 03/02/2020 01/12/2020 07/23/2019  Decreased Interest 1 3 0  Down, Depressed, Hopeless 1 1 0  PHQ - 2 Score  2 4 0  Altered sleeping 2 1 0  Tired, decreased energy 1 1 1  Change in appetite 0 0 0  Feeling bad or failure about yourself  0 0 0  Trouble concentrating 0 0 1  Moving slowly or fidgety/restless 0 0 0  Suicidal thoughts 0 0 0  PHQ-9 Score 5 6 2  Difficult doing work/chores Not difficult at all Not difficult at all Not difficult at all  Some recent data might be hidden   GAD 7 : Generalized Anxiety Score 03/02/2020  Nervous, Anxious, on Edge 0  Control/stop worrying 0  Worry too much - different things 0  Trouble relaxing 0  Restless 0  Easily annoyed or irritable 0  Afraid - awful might happen 0  Total GAD 7 Score 0  Anxiety Difficulty Not difficult at all    Assessment/ Plan: Virginia Nash here for annual physical exam.   1. Annual physical exam  2. Obesity (BMI 35.0-39.9 without comorbidity) Check nonfasting labs - CMP14+EGFR - Lipid Panel - TSH  3. Dyspnea on exertion Chronic and stable.  Advised to challenge with albuterol as she has not been using this during activity - TSH  4. Grief reaction Offered grief counseling.  She is going to reach out to hospice care for this.  I have encouraged her to follow-up with me within the next 2 months  as I do worry that symptoms may worsen during the holidays.  She was married to her husband for over 50 years and that this is quite a change for her.  5. Need for immunization against influenza Administered - Flu Vaccine QUAD High Dose(Fluad)   Counseled on healthy lifestyle choices, including diet (rich in fruits, vegetables and lean meats and low in salt and simple carbohydrates) and exercise (at least 30 minutes of moderate physical activity daily).  Patient to follow up in 1 year for annual exam or sooner if needed.  Ashly M. Gottschalk, DO      

## 2020-03-02 NOTE — Patient Instructions (Signed)
You had labs performed today.  You will be contacted with the results of the labs once they are available, usually in the next 3 business days for routine lab work.  If you have an active my chart account, they will be released to your MyChart.  If you prefer to have these labs released to you via telephone, please let us know.  If you had a pap smear or biopsy performed, expect to be contacted in about 7-10 days.  I think you should consider seeing one of the grief counselors. I really think this would be beneficial for you.  I'd like to see you back in 2 months, before Christmas holiday.   Preventive Care 24 Years and Older, Female Preventive care refers to lifestyle choices and visits with your health care provider that can promote health and wellness. This includes:  A yearly physical exam. This is also called an annual well check.  Regular dental and eye exams.  Immunizations.  Screening for certain conditions.  Healthy lifestyle choices, such as diet and exercise. What can I expect for my preventive care visit? Physical exam Your health care provider will check:  Height and weight. These may be used to calculate body mass index (BMI), which is a measurement that tells if you are at a healthy weight.  Heart rate and blood pressure.  Your skin for abnormal spots. Counseling Your health care provider may ask you questions about:  Alcohol, tobacco, and drug use.  Emotional well-being.  Home and relationship well-being.  Sexual activity.  Eating habits.  History of falls.  Memory and ability to understand (cognition).  Work and work Statistician.  Pregnancy and menstrual history. What immunizations do I need?  Influenza (flu) vaccine  This is recommended every year. Tetanus, diphtheria, and pertussis (Tdap) vaccine  You may need a Td booster every 10 years. Varicella (chickenpox) vaccine  You may need this vaccine if you have not already been  vaccinated. Zoster (shingles) vaccine  You may need this after age 49. Pneumococcal conjugate (PCV13) vaccine  One dose is recommended after age 31. Pneumococcal polysaccharide (PPSV23) vaccine  One dose is recommended after age 32. Measles, mumps, and rubella (MMR) vaccine  You may need at least one dose of MMR if you were born in 1957 or later. You may also need a second dose. Meningococcal conjugate (MenACWY) vaccine  You may need this if you have certain conditions. Hepatitis A vaccine  You may need this if you have certain conditions or if you travel or work in places where you may be exposed to hepatitis A. Hepatitis B vaccine  You may need this if you have certain conditions or if you travel or work in places where you may be exposed to hepatitis B. Haemophilus influenzae type b (Hib) vaccine  You may need this if you have certain conditions. You may receive vaccines as individual doses or as more than one vaccine together in one shot (combination vaccines). Talk with your health care provider about the risks and benefits of combination vaccines. What tests do I need? Blood tests  Lipid and cholesterol levels. These may be checked every 5 years, or more frequently depending on your overall health.  Hepatitis C test.  Hepatitis B test. Screening  Lung cancer screening. You may have this screening every year starting at age 16 if you have a 30-pack-year history of smoking and currently smoke or have quit within the past 15 years.  Colorectal cancer screening. All adults should  have this screening starting at age 53 and continuing until age 45. Your health care provider may recommend screening at age 86 if you are at increased risk. You will have tests every 1-10 years, depending on your results and the type of screening test.  Diabetes screening. This is done by checking your blood sugar (glucose) after you have not eaten for a while (fasting). You may have this done  every 1-3 years.  Mammogram. This may be done every 1-2 years. Talk with your health care provider about how often you should have regular mammograms.  BRCA-related cancer screening. This may be done if you have a family history of breast, ovarian, tubal, or peritoneal cancers. Other tests  Sexually transmitted disease (STD) testing.  Bone density scan. This is done to screen for osteoporosis. You may have this done starting at age 14. Follow these instructions at home: Eating and drinking  Eat a diet that includes fresh fruits and vegetables, whole grains, lean protein, and low-fat dairy products. Limit your intake of foods with high amounts of sugar, saturated fats, and salt.  Take vitamin and mineral supplements as recommended by your health care provider.  Do not drink alcohol if your health care provider tells you not to drink.  If you drink alcohol: ? Limit how much you have to 0-1 drink a day. ? Be aware of how much alcohol is in your drink. In the [place] one drink equals one 12 oz bottle of beer (355 mL), one 5 oz glass of wine (148 mL), or one 1 oz glass of hard liquor (44 mL). Lifestyle  Take daily care of your teeth and gums.  Stay active. Exercise for at least 30 minutes on 5 or more days each week.  Do not use any products that contain nicotine or tobacco, such as cigarettes, e-cigarettes, and chewing tobacco. If you need help quitting, ask your health care provider.  If you are sexually active, practice safe sex. Use a condom or other form of protection in order to prevent STIs (sexually transmitted infections).  Talk with your health care provider about taking a low-dose aspirin or statin. What's next?  Go to your health care provider once a year for a well check visit.  Ask your health care provider how often you should have your eyes and teeth checked.  Stay up to date on all vaccines. This information is not intended to replace advice given to you by your  health care provider. Make sure you discuss any questions you have with your health care provider. Document Revised: 05/01/2018 Document Reviewed: 05/01/2018 Elsevier Patient Education  2020 [employer].

## 2020-03-03 ENCOUNTER — Other Ambulatory Visit: Payer: Self-pay

## 2020-03-03 DIAGNOSIS — E7800 Pure hypercholesterolemia, unspecified: Secondary | ICD-10-CM

## 2020-03-03 LAB — CMP14+EGFR
ALT: 19 IU/L (ref 0–32)
AST: 19 IU/L (ref 0–40)
Albumin/Globulin Ratio: 1.7 (ref 1.2–2.2)
Albumin: 4.4 g/dL (ref 3.7–4.7)
Alkaline Phosphatase: 126 IU/L — ABNORMAL HIGH (ref 44–121)
BUN/Creatinine Ratio: 17 (ref 12–28)
BUN: 14 mg/dL (ref 8–27)
Bilirubin Total: 0.6 mg/dL (ref 0.0–1.2)
CO2: 24 mmol/L (ref 20–29)
Calcium: 9.4 mg/dL (ref 8.7–10.3)
Chloride: 104 mmol/L (ref 96–106)
Creatinine, Ser: 0.81 mg/dL (ref 0.57–1.00)
GFR calc Af Amer: 84 mL/min/{1.73_m2} (ref >59)
GFR calc non Af Amer: 73 mL/min/{1.73_m2} (ref >59)
Globulin, Total: 2.6 g/dL (ref 1.5–4.5)
Glucose: 97 mg/dL (ref 65–99)
Potassium: 4.3 mmol/L (ref 3.5–5.2)
Sodium: 142 mmol/L (ref 134–144)
Total Protein: 7 g/dL (ref 6.0–8.5)

## 2020-03-03 LAB — LIPID PANEL
Chol/HDL Ratio: 3.7 ratio (ref 0.0–4.4)
Cholesterol, Total: 187 mg/dL (ref 100–199)
HDL: 51 mg/dL (ref >39)
LDL Chol Calc (NIH): 111 mg/dL — ABNORMAL HIGH (ref 0–99)
Triglycerides: 142 mg/dL (ref 0–149)
VLDL Cholesterol Cal: 25 mg/dL (ref 5–40)

## 2020-03-03 LAB — TSH: TSH: 3.31 u[IU]/mL (ref 0.450–4.500)

## 2020-03-07 ENCOUNTER — Other Ambulatory Visit: Payer: Medicare Other

## 2020-03-07 ENCOUNTER — Other Ambulatory Visit: Payer: Self-pay

## 2020-03-07 DIAGNOSIS — R7989 Other specified abnormal findings of blood chemistry: Secondary | ICD-10-CM | POA: Diagnosis not present

## 2020-03-07 DIAGNOSIS — E669 Obesity, unspecified: Secondary | ICD-10-CM

## 2020-03-07 DIAGNOSIS — E7800 Pure hypercholesterolemia, unspecified: Secondary | ICD-10-CM

## 2020-03-07 LAB — BAYER DCA HB A1C WAIVED: HB A1C (BAYER DCA - WAIVED): 5.8 % (ref <7.0)

## 2020-03-07 LAB — LIPID PANEL
Chol/HDL Ratio: 4.5 ratio — ABNORMAL HIGH (ref 0.0–4.4)
Cholesterol, Total: 194 mg/dL (ref 100–199)
HDL: 43 mg/dL (ref >39)
LDL Chol Calc (NIH): 120 mg/dL — ABNORMAL HIGH (ref 0–99)
Triglycerides: 174 mg/dL — ABNORMAL HIGH (ref 0–149)
VLDL Cholesterol Cal: 31 mg/dL (ref 5–40)

## 2020-03-07 NOTE — Addendum Note (Signed)
Addended by: [name] on: 03/07/2020 05:19 PM   Modules accepted: Level of Service

## 2020-03-08 ENCOUNTER — Telehealth: Payer: Self-pay

## 2020-03-08 MED ORDER — ATORVASTATIN CALCIUM 20 MG PO TABS: 20.0000 mg | ORAL_TABLET | Freq: Every day | ORAL | 0 refills | Status: DC

## 2020-03-08 NOTE — Telephone Encounter (Signed)
Lmtcb.

## 2020-03-17 DIAGNOSIS — Z23 Encounter for immunization: Secondary | ICD-10-CM | POA: Diagnosis not present

## 2020-04-18 DIAGNOSIS — Z124 Encounter for screening for malignant neoplasm of cervix: Secondary | ICD-10-CM | POA: Diagnosis not present

## 2020-04-18 DIAGNOSIS — Z01419 Encounter for gynecological examination (general) (routine) without abnormal findings: Secondary | ICD-10-CM | POA: Diagnosis not present

## 2020-04-19 ENCOUNTER — Other Ambulatory Visit: Payer: Self-pay | Admitting: *Deleted

## 2020-04-19 ENCOUNTER — Telehealth (INDEPENDENT_AMBULATORY_CARE_PROVIDER_SITE_OTHER): Payer: Self-pay | Admitting: Internal Medicine

## 2020-04-19 MED ORDER — ATORVASTATIN CALCIUM 20 MG PO TABS: 20.0000 mg | ORAL_TABLET | Freq: Every day | ORAL | 0 refills | Status: DC

## 2020-04-19 NOTE — Telephone Encounter (Signed)
Patient called the office stated she had a colonoscopy back in August of 2020 was put on some medication that she has a question about - ph# [phone]

## 2020-04-20 NOTE — Telephone Encounter (Signed)
I have talked with the patient. She states that she started itching about 1 year ago and more so after taking the Pantoprazole. She talked with a physician yesterday and she was advised that the Pantoprazole has peanut oil in it. The patient is allergic to Peanuts. This is documented in her chart  under allergies.  Patient last took the pantoprazole yesterday. She also states that she is taking Famotidine later in the day along with allergy medication.  She would like to know if the pantoprazole is the culprit and if so , if she needs to take something else along with the Famotidine what would that be?  Patient was at advised that this would be addressed with one of our providers, once this had been done we would call her back at [phone].

## 2020-04-20 NOTE — Telephone Encounter (Signed)
I researched this and did not find anything about pantoprazole and peanut oil. She could be allergic to pantoprazole. As she has evidence of esophagitis in the past she needs to be on a PPI. She can switch to Prilosec OTC 20 mg daily and follow up in clinic.  Thanks

## 2020-04-21 NOTE — Telephone Encounter (Signed)
Patient was called and given the information/recommendation per Dr.Castaneda. He ask that the patient be  given a office visit for a follow up.

## 2020-05-02 ENCOUNTER — Ambulatory Visit (INDEPENDENT_AMBULATORY_CARE_PROVIDER_SITE_OTHER): Payer: Medicare Other | Admitting: Family Medicine

## 2020-05-02 ENCOUNTER — Other Ambulatory Visit: Payer: Self-pay

## 2020-05-02 VITALS — BP 118/70 | HR 67 | Temp 98.4°F | Ht 61.0 in | Wt 192.0 lb

## 2020-05-02 DIAGNOSIS — F4321 Adjustment disorder with depressed mood: Secondary | ICD-10-CM | POA: Diagnosis not present

## 2020-05-02 NOTE — Progress Notes (Signed)
Subjective: CC:Follow-up grief PCP: [name], DO Virginia Nash is a 74 y.o. female presenting to clinic today for:  1.  Grief reaction Patient reports that overall things seem to be getting gradually better.  She had a good Thanksgiving with her family.  Several family members came from all over the place to be present.  She does still find herself intermittently crying over random things but overall feels like she is in a better place than she had been.  She is been enjoying her 69 months old grandson, who was born just at the end of September.  She has another grandchild due in March and she is looking forward to this.  Overall she continues to feel well supported by her family.   ROS: Per HPI  Allergies  Allergen Reactions  . Codeine Nausea Only  . Other     Peas - rash Asparagus - rash Celery - rash  . Omeprazole Other (See Comments)    Did not work  . Peanut Oil Rash and Other (See Comments)    Congestion with nasal scabs    Past Medical History:  Diagnosis Date  . Allergy   . Arthritis   . Asthma    as child  . Biliary dyskinesia   . Blood transfusion without reported diagnosis   . Cancer ([place])   . Cataract     Current Outpatient Medications:  .  albuterol (VENTOLIN HFA) 108 (90 Base) MCG/ACT inhaler, Inhale 2 puffs into the lungs every 4 (four) hours as needed for wheezing or shortness of breath., Disp: 18 g, Rfl: 5 .  atorvastatin (LIPITOR) 20 MG tablet, Take 1 tablet (20 mg total) by mouth daily., Disp: 90 tablet, Rfl: 0 .  famotidine (PEPCID) 20 MG tablet, Take 1 tablet (20 mg total) by mouth at bedtime., Disp: , Rfl:  .  Homeopathic Products [facility] RELIEF EX), Apply 1 application topically daily as needed (pain)., Disp: , Rfl:  .  loratadine (CLARITIN) 10 MG tablet, Take 10 mg by mouth daily., Disp: , Rfl:  .  Magnesium 250 MG TABS, Take 250 mg by mouth daily. , Disp: , Rfl:  .  Multiple Vitamin (MULTIVITAMIN WITH MINERALS) TABS tablet,  Take 1 tablet by mouth daily., Disp: , Rfl:  .  pantoprazole (PROTONIX) 40 MG tablet, Take 1 tablet once daily before breakfast., Disp: 30 tablet, Rfl: 4 Social History   Socioeconomic History  . Marital status: Married    Spouse name: [name]  . Number of children: 3  . Years of education: 12th Grade  . Highest education level: High school graduate  Occupational History  . Occupation: Retired  Tobacco Use  . Smoking status: Never Smoker  . Smokeless tobacco: Never Used  Vaping Use  . Vaping Use: Never used  Substance and Sexual Activity  . Alcohol use: No  . Drug use: No  . Sexual activity: Yes  Other Topics Concern  . Not on file  Social History Narrative   Retired, married, 3 grown children. Enjoys sewing and crafting.    Social Determinants of Health   Financial Resource Strain: Not on file  Food Insecurity: Not on file  Transportation Needs: Not on file  Physical Activity: Not on file  Stress: Not on file  Social Connections: Not on file  Intimate Partner Violence: Not on file   Family History  Problem Relation Age of Onset  . Arthritis Mother   . Cancer Mother   . Cancer Father   .  Hearing loss Sister   . Stroke Maternal Grandmother   . Kidney disease Paternal Grandmother   . Thyroid disease Daughter     Objective: Office vital signs reviewed. BP 118/70   Pulse 67   Temp 98.4 F (36.9 C) (Temporal)   Ht 5\' 1"  (1.549 m)   Wt 192 lb (87.1 kg)   SpO2 97%   BMI 36.28 kg/m   Physical Examination:  General: Awake, alert, well nourished, well appearing. Brighter appearing. No acute distress Psych: Mood stable.  Speech normal.  Thought process linear.  She is pleasant and engaging.  Depression screen [facility] 2/9 05/02/2020 03/02/2020 01/12/2020  Decreased Interest 0 1 3  Down, Depressed, Hopeless 0 1 1  PHQ - 2 Score 0 2 4  Altered sleeping 0 2 1  Tired, decreased energy 0 1 1  Change in appetite 0 0 0  Feeling bad or failure about yourself  0 0 0   Trouble concentrating 0 0 0  Moving slowly or fidgety/restless 0 0 0  Suicidal thoughts 0 0 0  PHQ-9 Score 0 5 6  Difficult doing work/chores - Not difficult at all Not difficult at all  Some recent data might be hidden    Assessment/ Plan: 74 y.o. female   Grief reaction  Doing well.  It sounds like she is healing appropriately.  I have encouraged her to follow-up with me should she need me prior to her next scheduled appointment but otherwise is welcome to follow-up within 6 to 12 months for her regular checkups.  No orders of the defined types were placed in this encounter.  No orders of the defined types were placed in this encounter.  Total time spent with patient 32 minutes.  Greater than 50% of encounter spent in coordination of care/counseling.   [name], DO [place] [phone]

## 2020-05-04 DIAGNOSIS — H33321 Round hole, right eye: Secondary | ICD-10-CM | POA: Diagnosis not present

## 2020-05-04 DIAGNOSIS — Z961 Presence of intraocular lens: Secondary | ICD-10-CM | POA: Diagnosis not present

## 2020-05-04 DIAGNOSIS — H40013 Open angle with borderline findings, low risk, bilateral: Secondary | ICD-10-CM | POA: Diagnosis not present

## 2020-05-04 DIAGNOSIS — H04123 Dry eye syndrome of bilateral lacrimal glands: Secondary | ICD-10-CM | POA: Diagnosis not present

## 2020-05-22 ENCOUNTER — Other Ambulatory Visit: Payer: Self-pay | Admitting: Family Medicine

## 2020-06-08 ENCOUNTER — Other Ambulatory Visit: Payer: Self-pay

## 2020-06-08 ENCOUNTER — Ambulatory Visit (INDEPENDENT_AMBULATORY_CARE_PROVIDER_SITE_OTHER): Payer: Medicare Other | Admitting: Gastroenterology

## 2020-06-08 ENCOUNTER — Encounter (INDEPENDENT_AMBULATORY_CARE_PROVIDER_SITE_OTHER): Payer: Self-pay | Admitting: Gastroenterology

## 2020-06-08 VITALS — BP 127/77 | HR 91 | Temp 97.9°F | Ht 61.0 in | Wt 194.0 lb

## 2020-06-08 DIAGNOSIS — K2100 Gastro-esophageal reflux disease with esophagitis, without bleeding: Secondary | ICD-10-CM

## 2020-06-08 DIAGNOSIS — R110 Nausea: Secondary | ICD-10-CM | POA: Diagnosis not present

## 2020-06-08 MED ORDER — ONDANSETRON HCL 4 MG PO TABS: 4.0000 mg | ORAL_TABLET | Freq: Three times a day (TID) | ORAL | 1 refills | Status: DC | PRN

## 2020-06-08 NOTE — Progress Notes (Addendum)
Maylon Peppers, M.D. Gastroenterology & Hepatology [facility] [address] [place], [place] [address]  Primary Care Physician: [name], [place] [address]  I will communicate my assessment and recommendations to the referring MD via EMR.  Problems: 1. GERD 2. Nausea w/o vomiting  History of Present Illness: Virginia Nash is a 75 y.o. female with past medical history of GERD, asthma, who presents for follow up of GERD and nausea.  The patient was last seen on December 25, 2018. At that time, the patient the patient was increased to 40 mg of omeprazole every day due to symptoms consistent with GERD. 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  This was dilated with a Maloney dilator up to 3 [place], mucosal disruption was noted.  Biopsies were also taken from the esophagus as there were mild changes concerning for EOE (biopsies negative for EOE, more consistent with reflux), also found to have grade B esophagitis and a 2 cm hiatal hernia.  Stomach showed erythema and erosions in the antrum and prepyloric area with biopsies negative for H. pylori, normal duodenum.  Patient reports that she has presented recurrent episodes of nausea without vomiting, occasionally two times per week. Does not take any medications for this.  The patient reports that she has had issues in the past when swallowing rice or similar textures, but she has avoided eating this.  She describes this as dysphagia the results after drinking liquids.  However, this is much better compared to the past and does not bother her significantly.  Was on pantoprazole but switched to omeprazole 20 qday as she had significant itching in the past. This has improved since she switched to omeprazole. Usually takes it after having breakfast.She does not have any heartburn while  taking the Prilosec. The patient reports that the epigastric pain has improved significantly.  The patient denies having any fever, chills, hematochezia, melena, hematemesis, abdominal distention, abdominal pain, diarrhea, jaundice, pruritus or weight loss.  Last Colonoscopy: 2019, diverticulosis  Past Medical History: Past Medical History:  Diagnosis Date  . Allergy   . Arthritis   . Asthma    as child  . Biliary dyskinesia   . Blood transfusion without reported diagnosis   . Cancer ([place])   . Cataract     Past Surgical History: Past Surgical History:  Procedure Laterality Date  . ABDOMINAL HYSTERECTOMY    . CHOLECYSTECTOMY N/A 04/12/2015   Procedure: LAPAROSCOPIC CHOLECYSTECTOMY;  Surgeon: Judeth Horn, MD;  Location: [address];  Service: General;  Laterality: N/A;  . COLONOSCOPY N/A 07/31/2017   Procedure: COLONOSCOPY;  Surgeon: Rogene Houston, MD;  [facility];  Service: Endoscopy;  Laterality: N/A;  830  . DILATION AND CURETTAGE OF UTERUS    . ESOPHAGEAL DILATION N/A 01/16/2019   Procedure: ESOPHAGEAL DILATION;  Surgeon: Rogene Houston, MD;  [facility];  Service: Endoscopy;  Laterality: N/A;  . ESOPHAGOGASTRODUODENOSCOPY (EGD) WITH PROPOFOL N/A 01/16/2019   Procedure: ESOPHAGOGASTRODUODENOSCOPY (EGD) WITH PROPOFOL;  Surgeon: Rogene Houston, MD;  [facility];  Service: Endoscopy;  Laterality: N/A;  145pm  . EYE SURGERY     cataract with lens replacement  . TONSILLECTOMY      Family History: Family History  Problem Relation Age of Onset  . Arthritis Mother   . Cancer  Mother   . Cancer Father   . Hearing loss Sister   . Stroke Maternal Grandmother   . Kidney disease Paternal Grandmother   . Thyroid disease Daughter     Social History: Social History   Tobacco Use  Smoking Status Never Smoker  Smokeless Tobacco Never Used   Social History   Substance and Sexual Activity  Alcohol Use No   Social History    Substance and Sexual Activity  Drug Use No    Allergies: Allergies  Allergen Reactions  . Codeine Nausea Only  . Other     Peas - rash Asparagus - rash Celery - rash  . Omeprazole Other (See Comments)    Did not work  . Peanut Oil Rash and Other (See Comments)    Congestion with nasal scabs     Medications: Current Outpatient Medications  Medication Sig Dispense Refill  . albuterol (VENTOLIN HFA) 108 (90 Base) MCG/ACT inhaler Inhale 2 puffs into the lungs every 4 (four) hours as needed for wheezing or shortness of breath. 18 g 5  . atorvastatin (LIPITOR) 20 MG tablet Take 1 tablet by mouth once daily 90 tablet 0  . Homeopathic Products (THERAWORX RELIEF EX) Apply 1 application topically daily as needed (pain).    Marland Kitchen loratadine (CLARITIN) 10 MG tablet Take 10 mg by mouth daily.    . Magnesium 250 MG TABS Take 250 mg by mouth daily.     . Multiple Vitamin (MULTIVITAMIN WITH MINERALS) TABS tablet Take 1 tablet by mouth daily.    Marland Kitchen omeprazole (PRILOSEC) 20 MG capsule Take 20 mg by mouth daily.    . famotidine (PEPCID) 20 MG tablet Take 1 tablet (20 mg total) by mouth at bedtime. (Patient not taking: Reported on 06/08/2020)    . pantoprazole (PROTONIX) 40 MG tablet Take 1 tablet once daily before breakfast. (Patient not taking: Reported on 06/08/2020) 30 tablet 4   No current facility-administered medications for this visit.    Review of Systems: GENERAL: negative for malaise, night sweats HEENT: No changes in hearing or vision, no nose bleeds or other nasal problems. NECK: Negative for lumps, goiter, pain and significant neck swelling RESPIRATORY: Negative for cough, wheezing CARDIOVASCULAR: Negative for chest pain, leg swelling, palpitations, orthopnea GI: SEE HPI MUSCULOSKELETAL: Negative for joint pain or swelling, back pain, and muscle pain. SKIN: Negative for lesions, rash PSYCH: Negative for sleep disturbance, mood disorder and recent psychosocial stressors. HEMATOLOGY  Negative for prolonged bleeding, bruising easily, and swollen nodes. ENDOCRINE: Negative for cold or heat intolerance, polyuria, polydipsia and goiter. NEURO: negative for tremor, gait imbalance, syncope and seizures. The remainder of the review of systems is noncontributory.   Physical Exam: BP 127/77 (BP Location: Left Arm, Patient Position: Sitting, Cuff Size: Large)   Pulse 91   Temp 97.9 F (36.6 C) (Oral)   Ht 5\' 1"  (1.549 m)   Wt 194 lb (88 kg)   BMI 36.66 kg/m  GENERAL: The patient is AO x3, in no acute distress. HEENT: Head is normocephalic and atraumatic. EOMI are intact. Mouth is well hydrated and without lesions. NECK: Supple. No masses LUNGS: Clear to auscultation. No presence of rhonchi/wheezing/rales. Adequate chest expansion HEART: RRR, normal s1 and s2. ABDOMEN: Soft, nontender, no guarding, no peritoneal signs, and nondistended. BS +. No masses. EXTREMITIES: Without any cyanosis, clubbing, rash, lesions or edema. NEUROLOGIC: AOx3, no focal motor deficit. SKIN: no jaundice, no rashes  Imaging/Labs: as above  I personally reviewed and interpreted the available labs, imaging  and endoscopic files.  Impression and Plan: Virginia Nash is a 75 y.o. female with past medical history of GERD, asthma, who presents for follow up of GERD and nausea.  Regarding her GERD, he has been well controlled with the use of Prilosec 20 mg every day which she has been able to tolerate better than the pantoprazole.  I advised her on the best way to take the medication which should be 30 minutes before breakfast.  She will need to continue this medication indefinitely given the presence of stricture in the past.  Her dysphagia very infrequent and she has been able to manage it, will like to defer an endoscopic intervention unless strictly needed.  In terms of her nausea, no endoluminal alterations explain her symptoms, we will investigate this further with an abdominal ultrasound to rule out  any structural abnormalities leading to her occasional nausea.  I will also prescribe some Zofran to improve her symptoms.  - Start Zofran 4 mg every 8 h as needed for nausea - Continue Prilosec 20 mg qday - Schedule abdominal US - Explained presumed etiology of reflux symptoms. Instruction provided in the use of antireflux medication - patient should take medication in the morning 30-45 minutes before eating breakfast. Discussed avoidance of eating within 2 hours of lying down to sleep and benefit of blocks to elevate head of bed.  All questions were answered.      Harvel Quale, MD Gastroenterology and Hepatology [facility]

## 2020-06-08 NOTE — Patient Instructions (Signed)
Start Zofran as needed for nausea Continue Prilosec 20 mg qday Schedule abdominal US

## 2020-06-14 ENCOUNTER — Ambulatory Visit (HOSPITAL_COMMUNITY): Payer: Medicare Other

## 2020-07-01 ENCOUNTER — Other Ambulatory Visit (HOSPITAL_COMMUNITY): Payer: Self-pay | Admitting: Family Medicine

## 2020-07-01 DIAGNOSIS — Z1231 Encounter for screening mammogram for malignant neoplasm of breast: Secondary | ICD-10-CM

## 2020-07-05 ENCOUNTER — Other Ambulatory Visit: Payer: Self-pay

## 2020-07-05 ENCOUNTER — Ambulatory Visit (HOSPITAL_COMMUNITY)
Admission: RE | Admit: 2020-07-05 | Discharge: 2020-07-05 | Disposition: A | Discharge status: Home / Self Care | Payer: Medicare Other | Source: Ambulatory Visit | Attending: Gastroenterology | Admitting: Gastroenterology

## 2020-07-05 DIAGNOSIS — R110 Nausea: Secondary | ICD-10-CM | POA: Insufficient documentation

## 2020-08-03 ENCOUNTER — Other Ambulatory Visit: Payer: Self-pay

## 2020-08-03 ENCOUNTER — Ambulatory Visit (HOSPITAL_COMMUNITY)
Admission: RE | Admit: 2020-08-03 | Discharge: 2020-08-03 | Disposition: A | Discharge status: Home / Self Care | Payer: Medicare Other | Source: Ambulatory Visit | Attending: Family Medicine | Admitting: Family Medicine

## 2020-08-03 DIAGNOSIS — Z1231 Encounter for screening mammogram for malignant neoplasm of breast: Secondary | ICD-10-CM | POA: Diagnosis not present

## 2020-08-29 ENCOUNTER — Other Ambulatory Visit: Payer: Self-pay | Admitting: Family Medicine

## 2020-09-21 DIAGNOSIS — Z23 Encounter for immunization: Secondary | ICD-10-CM | POA: Diagnosis not present

## 2020-10-11 ENCOUNTER — Ambulatory Visit: Payer: Medicare Other | Admitting: Family Medicine

## 2020-11-18 ENCOUNTER — Other Ambulatory Visit: Payer: Self-pay

## 2020-11-18 ENCOUNTER — Ambulatory Visit (INDEPENDENT_AMBULATORY_CARE_PROVIDER_SITE_OTHER): Payer: Medicare Other

## 2020-11-18 ENCOUNTER — Encounter: Payer: Self-pay | Admitting: Family Medicine

## 2020-11-18 ENCOUNTER — Ambulatory Visit (INDEPENDENT_AMBULATORY_CARE_PROVIDER_SITE_OTHER): Payer: Medicare Other | Admitting: Family Medicine

## 2020-11-18 VITALS — BP 131/76 | HR 80 | Temp 97.8°F | Ht 61.0 in | Wt 200.6 lb

## 2020-11-18 DIAGNOSIS — R0789 Other chest pain: Secondary | ICD-10-CM

## 2020-11-18 DIAGNOSIS — R079 Chest pain, unspecified: Secondary | ICD-10-CM | POA: Diagnosis not present

## 2020-11-18 DIAGNOSIS — E7800 Pure hypercholesterolemia, unspecified: Secondary | ICD-10-CM

## 2020-11-18 DIAGNOSIS — R0600 Dyspnea, unspecified: Secondary | ICD-10-CM | POA: Diagnosis not present

## 2020-11-18 DIAGNOSIS — M1711 Unilateral primary osteoarthritis, right knee: Secondary | ICD-10-CM

## 2020-11-18 DIAGNOSIS — K219 Gastro-esophageal reflux disease without esophagitis: Secondary | ICD-10-CM | POA: Diagnosis not present

## 2020-11-18 DIAGNOSIS — R799 Abnormal finding of blood chemistry, unspecified: Secondary | ICD-10-CM | POA: Diagnosis not present

## 2020-11-18 LAB — BAYER DCA HB A1C WAIVED: HB A1C (BAYER DCA - WAIVED): 5.9 % (ref <7.0)

## 2020-11-18 NOTE — Progress Notes (Signed)
Subjective: CC: Atypical chest pain PCP: Virginia Nash MPT:VCIXU [name] is a 75 y.o. female presenting to clinic today for:  1.  Atypical chest pain Patient reports that she had atypical chest pain that she points to the epigastric area around the second week in June.  She thought she was having a heart attack.  She describes the pain as severe.  She does not report diaphoresis.  It resolved on its own after about 30 minutes.  She notes that when she went to get in the car to go to graduation it recurred.  She considered going to the ER but again it subsided after consumption of some ginger for the associated nausea.  She reports compliance with her Prilosec 20 mg daily.  She has not had any recurrence but does note that she seems to be more short of breath than she has been in the past.  She admits to some weight gain and wonders if this has contributed.  There is no family history of CAD but she does report a CVA in her maternal grandmother somewhere in her 6s or 65s.   ROS: Per HPI  Allergies  Allergen Reactions   Codeine Nausea Only   Other     Peas - rash Asparagus - rash Celery - rash   Omeprazole Other (See Comments)    Did not work   Peanut Oil Rash and Other (See Comments)    Congestion with nasal scabs    Past Medical History:  Diagnosis Date   Allergy    Arthritis    Asthma    as child   Biliary dyskinesia    Blood transfusion without reported diagnosis    Cancer (HCC)    Cataract     Current Outpatient Medications:    albuterol (VENTOLIN HFA) 108 (90 Base) MCG/ACT inhaler, Inhale 2 puffs into the lungs every 4 (four) hours as needed for wheezing or shortness of breath., Disp: 18 g, Rfl: 5   atorvastatin (LIPITOR) 20 MG tablet, Take 1 tablet (20 mg total) by mouth daily. (NEEDS TO BE SEEN BEFORE NEXT REFILL), Disp: 90 tablet, Rfl: 0   Homeopathic Products (THERAWORX RELIEF EX), Apply 1 application topically daily as needed (pain)., Disp: , Rfl:     loratadine (CLARITIN) 10 MG tablet, Take 10 mg by mouth daily., Disp: , Rfl:    Magnesium 250 MG TABS, Take 250 mg by mouth daily. , Disp: , Rfl:    Multiple Vitamin (MULTIVITAMIN WITH MINERALS) TABS tablet, Take 1 tablet by mouth daily., Disp: , Rfl:    omeprazole (PRILOSEC) 20 MG capsule, Take 20 mg by mouth daily., Disp: , Rfl:    ondansetron (ZOFRAN) 4 MG tablet, Take 1 tablet (4 mg total) by mouth every 8 (eight) hours as needed for nausea or vomiting., Disp: 30 tablet, Rfl: 1 Social History   Socioeconomic History   Marital status: Married    Spouse name: Virginia Nash   Number of children: 3   Years of education: 12th Grade   Highest education level: High school graduate  Occupational History   Occupation: Retired  Tobacco Use   Smoking status: Never   Smokeless tobacco: Never  Vaping Use   Vaping Use: Never used  Substance and Sexual Activity   Alcohol use: No   Drug use: No   Sexual activity: Yes  Other Topics Concern   Not on file  Social History Narrative   Retired, married, 3 grown children. Enjoys sewing and crafting.  Social Determinants of Health   Financial Resource Strain: Not on file  Food Insecurity: Not on file  Transportation Needs: Not on file  Physical Activity: Not on file  Stress: Not on file  Social Connections: Not on file  Intimate Partner Violence: Not on file   Family History  Problem Relation Age of Onset   Arthritis Mother    Cancer Mother    Cancer Father    Hearing loss Sister    Stroke Maternal Grandmother    Kidney disease Paternal Grandmother    Thyroid disease Daughter     Objective: Office vital signs reviewed. BP 131/76   Pulse 80   Temp 97.8 F (36.6 C)   Ht $R'5\' 1"'Qz$  (1.549 m)   Wt 200 lb 9.6 oz (91 kg)   SpO2 95%   BMI 37.90 kg/m   Physical Examination:  General: Awake, alert, well nourished, No acute distress HEENT: Normal, sclera white, MMM Cardio: regular rate and rhythm, S1S2 heard, no murmurs appreciated Pulm:  clear to auscultation bilaterally, no wheezes, rhonchi or rales; normal work of breathing on room air GI: soft, non-tender, non-distended, bowel sounds present x4, no hepatomegaly, no splenomegaly, no masses Extremities: warm, well perfused, No edema, cyanosis or clubbing; +2 pulses bilaterally. No calf swelling  Assessment/ Plan: 75 y.o. female   Atypical chest pain - Plan: EKG 12-Lead, DG Chest 2 View, CBC, Ambulatory referral to Cardiology  Gastroesophageal reflux disease without esophagitis  Morbid obesity ([place]) - Plan: Bayer DCA Hb A1c Waived, CMP14+EGFR, CBC, TSH, T4, Free, Ambulatory referral to Cardiology  Pure hypercholesterolemia - Plan: CMP14+EGFR, Lipid Panel, TSH, T4, Free, Ambulatory referral to Cardiology  Primary osteoarthritis of right knee - Plan: CBC  Review of EKG does not demonstrate any acute findings including ischemic changes.  I have ordered a chest x-ray given reports of change in breathing.  She had no evidence of DVT or pulmonary embolism on exam with normal vital signs and lack of unilateral extremity findings.  Her pulmonary exam was fairly unremarkable.  I Nash question possible GERD as etiology of symptoms.  Her abdominal exam was unremarkable however.  She will continue PPI.  We will plan for referral to cardiology for consideration of stress testing given advanced age, hyperlipidemia and morbid obesity.    Orders Placed This Encounter  Procedures   EKG 12-Lead   No orders of the defined types were placed in this encounter.    Virginia Norlander, Nash [place] [phone]

## 2020-11-19 LAB — CMP14+EGFR
ALT: 17 IU/L (ref 0–32)
AST: 22 IU/L (ref 0–40)
Albumin/Globulin Ratio: 1.9 (ref 1.2–2.2)
Albumin: 4.5 g/dL (ref 3.7–4.7)
Alkaline Phosphatase: 143 IU/L — ABNORMAL HIGH (ref 44–121)
BUN/Creatinine Ratio: 24 (ref 12–28)
BUN: 21 mg/dL (ref 8–27)
Bilirubin Total: 1.1 mg/dL (ref 0.0–1.2)
CO2: 23 mmol/L (ref 20–29)
Calcium: 9.2 mg/dL (ref 8.7–10.3)
Chloride: 102 mmol/L (ref 96–106)
Creatinine, Ser: 0.88 mg/dL (ref 0.57–1.00)
Globulin, Total: 2.4 g/dL (ref 1.5–4.5)
Glucose: 98 mg/dL (ref 65–99)
Potassium: 4.5 mmol/L (ref 3.5–5.2)
Sodium: 139 mmol/L (ref 134–144)
Total Protein: 6.9 g/dL (ref 6.0–8.5)
eGFR: 70 mL/min/{1.73_m2} (ref >59)

## 2020-11-19 LAB — CBC
Hematocrit: 38.7 % (ref 34.0–46.6)
Hemoglobin: 13.2 g/dL (ref 11.1–15.9)
MCH: 32 pg (ref 26.6–33.0)
MCHC: 34.1 g/dL (ref 31.5–35.7)
MCV: 94 fL (ref 79–97)
Platelets: 191 10*3/uL (ref 150–450)
RBC: 4.12 x10E6/uL (ref 3.77–5.28)
RDW: 12.7 % (ref 11.7–15.4)
WBC: 7.1 10*3/uL (ref 3.4–10.8)

## 2020-11-19 LAB — LIPID PANEL
Chol/HDL Ratio: 2.6 ratio (ref 0.0–4.4)
Cholesterol, Total: 131 mg/dL (ref 100–199)
HDL: 51 mg/dL (ref >39)
LDL Chol Calc (NIH): 61 mg/dL (ref 0–99)
Triglycerides: 100 mg/dL (ref 0–149)
VLDL Cholesterol Cal: 19 mg/dL (ref 5–40)

## 2020-11-19 LAB — TSH: TSH: 2.8 u[IU]/mL (ref 0.450–4.500)

## 2020-11-19 LAB — T4, FREE: Free T4: 1.14 ng/dL (ref 0.82–1.77)

## 2020-11-22 ENCOUNTER — Telehealth: Payer: Self-pay | Admitting: Family Medicine

## 2020-11-22 NOTE — Telephone Encounter (Signed)
Lmtcb.

## 2020-11-24 NOTE — Telephone Encounter (Signed)
See result note.  

## 2020-11-24 NOTE — Telephone Encounter (Signed)
Please review labs and xray and advise

## 2020-11-28 ENCOUNTER — Other Ambulatory Visit: Payer: Self-pay

## 2020-11-28 ENCOUNTER — Ambulatory Visit (INDEPENDENT_AMBULATORY_CARE_PROVIDER_SITE_OTHER): Payer: Medicare Other | Admitting: Nurse Practitioner

## 2020-11-28 ENCOUNTER — Encounter: Payer: Self-pay | Admitting: Nurse Practitioner

## 2020-11-28 VITALS — BP 129/74 | HR 71 | Temp 97.1°F | Resp 20 | Ht 61.0 in | Wt 200.0 lb

## 2020-11-28 DIAGNOSIS — M25561 Pain in right knee: Secondary | ICD-10-CM | POA: Diagnosis not present

## 2020-11-28 MED ORDER — LIDOCAINE HCL 2 % IJ SOLN
1.0000 mL | Freq: Once | INTRAMUSCULAR | Status: AC
  Administered 2020-11-28: 20 mg via INTRADERMAL

## 2020-11-28 MED ORDER — METHYLPREDNISOLONE ACETATE 40 MG/ML IJ SUSP
40.0000 mg | Freq: Once | INTRAMUSCULAR | Status: AC
  Administered 2020-11-28: 40 mg via INTRAMUSCULAR

## 2020-11-28 NOTE — Progress Notes (Signed)
   Subjective:    Patient ID: Virginia Nash, female    DOB: April 21, 1946, 75 y.o.   MRN: [id number]   Chief Complaint: Knee Pain (Right knee/)   HPI Patient comes in today c/o right knee pain. Has been hurting intermittently for 2 months.Pain is intermittent. She had it injected 2 year sago and it really helped.     Review of Systems  Musculoskeletal:  Positive for arthralgias (right knee).  All other systems reviewed and are negative.     Objective:   Physical Exam Constitutional:      Appearance: Normal appearance. She is obese.  Musculoskeletal:     Comments: mild right knee effusion No patella tenderness FROM without pain Crepitus on flexion and extension   Skin:    General: Skin is warm.  Neurological:     General: No focal deficit present.     Mental Status: She is alert and oriented to person, place, and time.  Psychiatric:        Mood and Affect: Mood normal.        Behavior: Behavior normal.    BP 129/74   Pulse 71   Temp (!) 97.1 F (36.2 C) (Temporal)   Resp 20   Ht 5\' 1"  (1.549 m)   Wt 200 lb (90.7 kg)   SpO2 94%   BMI 37.79 kg/m   Joint Injection/Arthrocentesis  Date/Time: 11/28/2020 12:05 PM Performed by: Chevis Pretty, FNP Authorized by: Hassell Done, Mary-Margaret, FNP  Indications: joint swelling and pain  Body area: knee Joint: right knee Local anesthesia used: no  Anesthesia: Local anesthesia used: no  Sedation: Patient sedated: no  Preparation: Patient was prepped and draped in the usual sterile fashion. Needle size: 22 G Ultrasound guidance: no Approach: superior Betamethasone amount: 40 mg Lidocaine 2% amount: 1 mL Patient tolerance: patient tolerated the procedure well with no immediate complications         Assessment & Plan:   Virginia Nash in today with chief complaint of Knee Pain (Right knee/)   1. Acute pain of right knee Ice Elevate when sitting - lidocaine (XYLOCAINE) 2 % (with pres) injection 20  mg - methylPREDNISolone acetate (DEPO-MEDROL) injection 40 mg    The above assessment and management plan was discussed with the patient. The patient verbalized understanding of and has agreed to the management plan. Patient is aware to call the clinic if symptoms persist or worsen. Patient is aware when to return to the clinic for a follow-up visit. Patient educated on when it is appropriate to go to the emergency department.   Mary-Margaret Hassell Done, FNP

## 2020-11-28 NOTE — Patient Instructions (Signed)

## 2020-12-07 ENCOUNTER — Ambulatory Visit: Payer: Medicare Other | Admitting: Family Medicine

## 2021-01-03 ENCOUNTER — Telehealth: Payer: Self-pay | Admitting: Cardiology

## 2021-01-03 ENCOUNTER — Encounter: Payer: Self-pay | Admitting: *Deleted

## 2021-01-03 ENCOUNTER — Encounter: Payer: Self-pay | Admitting: Cardiology

## 2021-01-03 ENCOUNTER — Ambulatory Visit (INDEPENDENT_AMBULATORY_CARE_PROVIDER_SITE_OTHER): Payer: Medicare Other | Admitting: Cardiology

## 2021-01-03 VITALS — BP 144/80 | HR 70 | Ht 61.0 in | Wt 200.4 lb

## 2021-01-03 DIAGNOSIS — R079 Chest pain, unspecified: Secondary | ICD-10-CM | POA: Diagnosis not present

## 2021-01-03 NOTE — Progress Notes (Signed)
Clinical Summary Virginia Nash is a 75 y.o.female seen today as a new consult, referred by Dr Lajuana Ripple for the following medical problems.   1.Chest pain -episode early June.  - was sitting on commode, urinated - while sitting sudden onset of pain in chest - sharp pain midhchest/epigastric, felt weak. 10/10 in severity. +SOB. Had some nausea. Family came in and evaluated her. Pain started to subside after 15-20 minutes. Not positional.  - episode of nausea - took some ginger chews for nausea  - no recurrent chest pains since - milder episodes prior  - sedentary lifestyle. Has had some DOE with activities x 1 year   CAD risk factors:hyperlipidemia, age   37.GERD/esophagitis/gastritis   Past Medical History:  Diagnosis Date   Allergy    Arthritis    Asthma    as child   Biliary dyskinesia    Blood transfusion without reported diagnosis    Cancer ([place])    Cataract      Allergies  Allergen Reactions   Codeine Nausea Only   Other     Peas - rash Asparagus - rash Celery - rash   Omeprazole Other (See Comments)    Did not work   Peanut Oil Rash and Other (See Comments)    Congestion with nasal scabs      Current Outpatient Medications  Medication Sig Dispense Refill   albuterol (VENTOLIN HFA) 108 (90 Base) MCG/ACT inhaler Inhale 2 puffs into the lungs every 4 (four) hours as needed for wheezing or shortness of breath. 18 g 5   aspirin EC 81 MG tablet Take 81 mg by mouth daily. Swallow whole.     atorvastatin (LIPITOR) 20 MG tablet Take 1 tablet (20 mg total) by mouth daily. (NEEDS TO BE SEEN BEFORE NEXT REFILL) 90 tablet 0   Homeopathic Products (THERAWORX RELIEF EX) Apply 1 application topically daily as needed (pain).     loratadine (CLARITIN) 10 MG tablet Take 10 mg by mouth daily.     Magnesium 250 MG TABS Take 250 mg by mouth daily.      Multiple Vitamin (MULTIVITAMIN WITH MINERALS) TABS tablet Take 1 tablet by mouth daily.     omeprazole (PRILOSEC)  20 MG capsule Take 20 mg by mouth daily.     ondansetron (ZOFRAN) 4 MG tablet Take 1 tablet (4 mg total) by mouth every 8 (eight) hours as needed for nausea or vomiting. 30 tablet 1   No current facility-administered medications for this visit.     Past Surgical History:  Procedure Laterality Date   ABDOMINAL HYSTERECTOMY     CHOLECYSTECTOMY N/A 04/12/2015   Procedure: LAPAROSCOPIC CHOLECYSTECTOMY;  Surgeon: Judeth Horn, MD;  Location: [facility];  Service: General;  Laterality: N/A;   COLONOSCOPY N/A 07/31/2017   Procedure: COLONOSCOPY;  Surgeon: Rogene Houston, MD;  [facility];  Service: Endoscopy;  Laterality: N/A;  [place] N/A 01/16/2019   Procedure: ESOPHAGEAL DILATION;  Surgeon: Rogene Houston, MD;  [facility];  Service: Endoscopy;  Laterality: N/A;   ESOPHAGOGASTRODUODENOSCOPY (EGD) WITH PROPOFOL N/A 01/16/2019   Procedure: ESOPHAGOGASTRODUODENOSCOPY (EGD) WITH PROPOFOL;  Surgeon: Rogene Houston, MD;  [facility];  Service: Endoscopy;  Laterality: N/A;  145pm   EYE SURGERY     cataract with lens replacement   TONSILLECTOMY       Allergies  Allergen Reactions   Codeine  Nausea Only   Other     Peas - rash Asparagus - rash Celery - rash   Omeprazole Other (See Comments)    Did not work   Peanut Oil Rash and Other (See Comments)    Congestion with nasal scabs       Family History  Problem Relation Age of Onset   Arthritis Mother    Cancer Mother    Cancer Father    Hearing loss Sister    Stroke Maternal Grandmother    Kidney disease Paternal Grandmother    Thyroid disease Daughter      Social History Ms. Erbes reports that she has never smoked. She has never used smokeless tobacco. Ms. Gopalakrishnan reports no history of alcohol use.   Review of Systems CONSTITUTIONAL: No weight loss, fever, chills, weakness or fatigue.  HEENT: Eyes: No visual  loss, blurred vision, double vision or yellow sclerae.No hearing loss, sneezing, congestion, runny nose or sore throat.  SKIN: No rash or itching.  CARDIOVASCULAR: per hpi RESPIRATORY: No shortness of breath, cough or sputum.  GASTROINTESTINAL: No anorexia, nausea, vomiting or diarrhea. No abdominal pain or blood.  GENITOURINARY: No burning on urination, no polyuria NEUROLOGICAL: No headache, dizziness, syncope, paralysis, ataxia, numbness or tingling in the extremities. No change in bowel or bladder control.  MUSCULOSKELETAL: No muscle, back pain, joint pain or stiffness.  LYMPHATICS: No enlarged nodes. No history of splenectomy.  PSYCHIATRIC: No history of depression or anxiety.  ENDOCRINOLOGIC: No reports of sweating, cold or heat intolerance. No polyuria or polydipsia.  Marland Kitchen   Physical Examination Today's Vitals   01/03/21 1042  BP: (!) 144/80  Pulse: 70  SpO2: 96%  Weight: 200 lb 6.4 oz (90.9 kg)  Height: '5\' 1"'$  (1.549 m)   Body mass index is 37.87 kg/m.  Gen: resting comfortably, no acute distress HEENT: no scleral icterus, pupils equal round and reactive, no palptable cervical adenopathy,  CV: RRR, no m/r/g no jvd Resp: Clear to auscultation bilaterally GI: abdomen is soft, non-tender, non-distended, normal bowel sounds, no hepatosplenomegaly MSK: extremities are warm, no edema.  Skin: warm, no rash Neuro:  no focal deficits Psych: appropriate affect     Assessment and Plan  1.Chest pain - unclear etiology - some recent DOE as well - will obtain an exercise nuclear stress test to further evaluate  F/u pending stress results, if negative f/u just as needed    Arnoldo Lenis, M.D.

## 2021-01-03 NOTE — Telephone Encounter (Signed)
Checking percert on the following patient for testing scheduled at [facility].    EXERCISE MYOVIEW  01/13/2021

## 2021-01-03 NOTE — Patient Instructions (Signed)
Medication Instructions:  Continue all current medications.   Labwork: none  Testing/Procedures: Your physician has requested that you have an exercise stress myoview. For further information please visit [email or web address]. Please follow instruction sheet, as given.  Office will contact with results via phone or letter.    Follow-Up: pending test results   Any Other Special Instructions Will Be Listed Below (If Applicable).    If you need a refill on your cardiac medications before your next appointment, please call your pharmacy.

## 2021-01-12 ENCOUNTER — Ambulatory Visit (INDEPENDENT_AMBULATORY_CARE_PROVIDER_SITE_OTHER): Payer: Medicare Other

## 2021-01-12 VITALS — Ht 61.0 in | Wt 200.0 lb

## 2021-01-12 DIAGNOSIS — Z0000 Encounter for general adult medical examination without abnormal findings: Secondary | ICD-10-CM

## 2021-01-12 NOTE — Patient Instructions (Signed)
Ms. Virginia Nash , Thank you for taking time to come for your Medicare Wellness Visit. I appreciate your ongoing commitment to your health goals. Please review the following plan we discussed and let me know if I can assist you in the future.   Screening recommendations/referrals: Colonoscopy: Done 07/31/2017 - Repeat in 10 years Mammogram: Done 08/03/2020 - Repeat annually Bone Density: Done 07/29/2019 - Repeat every 2 years Recommended yearly ophthalmology/optometry visit for glaucoma screening and checkup Recommended yearly dental visit for hygiene and checkup  Vaccinations: Influenza vaccine: Done 03/02/2020 - Repeat annually Pneumococcal vaccine: Done 10/21/2013 & 02/15/2015 Tdap vaccine: Done 9/22/220 - Repeat in 10 years Shingles vaccine: Due. Shingrix discussed. Please contact your pharmacy for coverage information.     Covid-19: Done 06/20/2019, 07/20/2019, 7 03/17/2020  Advanced directives: Please bring a copy of your health care power of attorney and living will to the office to be added to your chart at your convenience.   Conditions/risks identified: Aim for 30 minutes of exercise or brisk walking each day, drink 6-8 glasses of water and eat lots of fruits and vegetables.   Next appointment: Follow up in one year for your annual wellness visit    Preventive Care 65 Years and Older, Female Preventive care refers to lifestyle choices and visits with your health care provider that can promote health and wellness. What does preventive care include? A yearly physical exam. This is also called an annual well check. Dental exams once or twice a year. Routine eye exams. Ask your health care provider how often you should have your eyes checked. Personal lifestyle choices, including: Daily care of your teeth and gums. Regular physical activity. Eating a healthy diet. Avoiding tobacco and drug use. Limiting alcohol use. Practicing safe sex. Taking low-dose aspirin every day. Taking  vitamin and mineral supplements as recommended by your health care provider. What happens during an annual well check? The services and screenings done by your health care provider during your annual well check will depend on your age, overall health, lifestyle risk factors, and family history of disease. Counseling  Your health care provider may ask you questions about your: Alcohol use. Tobacco use. Drug use. Emotional well-being. Home and relationship well-being. Sexual activity. Eating habits. History of falls. Memory and ability to understand (cognition). Work and work Statistician. Reproductive health. Screening  You may have the following tests or measurements: Height, weight, and BMI. Blood pressure. Lipid and cholesterol levels. These may be checked every 5 years, or more frequently if you are over 34 years old. Skin check. Lung cancer screening. You may have this screening every year starting at age 72 if you have a 30-pack-year history of smoking and currently smoke or have quit within the past 15 years. Fecal occult blood test (FOBT) of the stool. You may have this test every year starting at age 59. Flexible sigmoidoscopy or colonoscopy. You may have a sigmoidoscopy every 5 years or a colonoscopy every 10 years starting at age 29. Hepatitis C blood test. Hepatitis B blood test. Sexually transmitted disease (STD) testing. Diabetes screening. This is done by checking your blood sugar (glucose) after you have not eaten for a while (fasting). You may have this done every 1-3 years. Bone density scan. This is done to screen for osteoporosis. You may have this done starting at age 43. Mammogram. This may be done every 1-2 years. Talk to your health care provider about how often you should have regular mammograms. Talk with your health care provider  about your test results, treatment options, and if necessary, the need for more tests. Vaccines  Your health care provider may  recommend certain vaccines, such as: Influenza vaccine. This is recommended every year. Tetanus, diphtheria, and acellular pertussis (Tdap, Td) vaccine. You may need a Td booster every 10 years. Zoster vaccine. You may need this after age 74. Pneumococcal 13-valent conjugate (PCV13) vaccine. One dose is recommended after age 54. Pneumococcal polysaccharide (PPSV23) vaccine. One dose is recommended after age 59. Talk to your health care provider about which screenings and vaccines you need and how often you need them. This information is not intended to replace advice given to you by your health care provider. Make sure you discuss any questions you have with your health care provider. Document Released: 06/03/2015 Document Revised: 01/25/2016 Document Reviewed: 03/08/2015 Elsevier Interactive Patient Education  2017 [employer] Prevention in the Home Falls can cause injuries. They can happen to people of all ages. There are many things you can do to make your home safe and to help prevent falls. What can I do on the outside of my home? Regularly fix the edges of walkways and driveways and fix any cracks. Remove anything that might make you trip as you walk through a door, such as a raised step or threshold. Trim any bushes or trees on the path to your home. Use bright outdoor lighting. Clear any walking paths of anything that might make someone trip, such as rocks or tools. Regularly check to see if handrails are loose or broken. Make sure that both sides of any steps have handrails. Any raised decks and porches should have guardrails on the edges. Have any leaves, snow, or ice cleared regularly. Use sand or salt on walking paths during winter. Clean up any spills in your garage right away. This includes oil or grease spills. What can I do in the bathroom? Use night lights. Install grab bars by the toilet and in the tub and shower. Do not use towel bars as grab bars. Use non-skid  mats or decals in the tub or shower. If you need to sit down in the shower, use a plastic, non-slip stool. Keep the floor dry. Clean up any water that spills on the floor as soon as it happens. Remove soap buildup in the tub or shower regularly. Attach bath mats securely with double-sided non-slip rug tape. Do not have throw rugs and other things on the floor that can make you trip. What can I do in the bedroom? Use night lights. Make sure that you have a light by your bed that is easy to reach. Do not use any sheets or blankets that are too big for your bed. They should not hang down onto the floor. Have a firm chair that has side arms. You can use this for support while you get dressed. Do not have throw rugs and other things on the floor that can make you trip. What can I do in the kitchen? Clean up any spills right away. Avoid walking on wet floors. Keep items that you use a lot in easy-to-reach places. If you need to reach something above you, use a strong step stool that has a grab bar. Keep electrical cords out of the way. Do not use floor polish or wax that makes floors slippery. If you must use wax, use non-skid floor wax. Do not have throw rugs and other things on the floor that can make you trip. What can I do  with my stairs? Do not leave any items on the stairs. Make sure that there are handrails on both sides of the stairs and use them. Fix handrails that are broken or loose. Make sure that handrails are as long as the stairways. Check any carpeting to make sure that it is firmly attached to the stairs. Fix any carpet that is loose or worn. Avoid having throw rugs at the top or bottom of the stairs. If you do have throw rugs, attach them to the floor with carpet tape. Make sure that you have a light switch at the top of the stairs and the bottom of the stairs. If you do not have them, ask someone to add them for you. What else can I do to help prevent falls? Wear shoes  that: Do not have high heels. Have rubber bottoms. Are comfortable and fit you well. Are closed at the toe. Do not wear sandals. If you use a stepladder: Make sure that it is fully opened. Do not climb a closed stepladder. Make sure that both sides of the stepladder are locked into place. Ask someone to hold it for you, if possible. Clearly mark and make sure that you can see: Any grab bars or handrails. First and last steps. Where the edge of each step is. Use tools that help you move around (mobility aids) if they are needed. These include: Canes. Walkers. Scooters. Crutches. Turn on the lights when you go into a dark area. Replace any light bulbs as soon as they burn out. Set up your furniture so you have a clear path. Avoid moving your furniture around. If any of your floors are uneven, fix them. If there are any pets around you, be aware of where they are. Review your medicines with your doctor. Some medicines can make you feel dizzy. This can increase your chance of falling. Ask your doctor what other things that you can do to help prevent falls. This information is not intended to replace advice given to you by your health care provider. Make sure you discuss any questions you have with your health care provider. Document Released: 03/03/2009 Document Revised: 10/13/2015 Document Reviewed: 06/11/2014 Elsevier Interactive Patient Education  2017 [employer].

## 2021-01-12 NOTE — Progress Notes (Signed)
Subjective:   METRA MOST is a 75 y.o. female who presents for Medicare Annual (Subsequent) preventive examination.  Virtual Visit via Telephone Note  I connected with  Murdis Holeman Lippard on 01/12/21 at 10:30 AM EDT by telephone and verified that I am speaking with the correct person using two identifiers.  Location: Patient: Home Provider: [facility] Persons participating in the virtual visit: patient/Nurse Health Advisor   I discussed the limitations, risks, security and privacy concerns of performing an evaluation and management service by telephone and the availability of in person appointments. The patient expressed understanding and agreed to proceed.  Interactive audio and video telecommunications were attempted between this nurse and patient, however failed, due to patient having technical difficulties OR patient did not have access to video capability.  We continued and completed visit with audio only.  Some vital signs may be absent or patient reported.   Cambryn Charters E Kadijah Shamoon, LPN   Review of Systems     Cardiac Risk Factors include: advanced age (>33mn, >>8women);sedentary lifestyle;obesity (BMI >30kg/m2);dyslipidemia;hypertension     Objective:    Today's Vitals   01/12/21 1029  Weight: 200 lb (90.7 kg)  Height: '5\' 1"'$  (1.549 m)   Body mass index is 37.79 kg/m.  Advanced Directives 01/12/2021 01/12/2020 01/07/2019 12/31/2017 12/25/2016 04/12/2015 04/05/2015  Does Patient Have a Medical Advance Directive? Yes Yes Yes Yes Yes Yes Yes  Type of [place]aramed[place]of [place] will Living will;Out of facility DNR (pink MOST or yellow form) HCudahyLiving will HWyolaLiving will HCl[place]Living will - HE[place]ottLiving will  Does patient want to make changes to medical advance directive? - No - Patient declined No - Patient declined Yes (MAU/Ambulatory/Procedural Areas - Information  given) - - -  Copy of [place] Chart? No - copy requested - No - copy requested No - copy requested No - copy requested No - copy requested Yes    Current Medications (verified) Outpatient Encounter Medications as of 01/12/2021  Medication Sig   albuterol (VENTOLIN HFA) 108 (90 Base) MCG/ACT inhaler Inhale 2 puffs into the lungs every 4 (four) hours as needed for wheezing or shortness of breath.   aspirin EC 81 MG tablet Take 81 mg by mouth daily. Swallow whole.   atorvastatin (LIPITOR) 20 MG tablet Take 1 tablet (20 mg total) by mouth daily. (NEEDS TO BE SEEN BEFORE NEXT REFILL)   loratadine (CLARITIN) 10 MG tablet Take 10 mg by mouth daily.   Magnesium 250 MG TABS Take 250 mg by mouth daily.    omeprazole (PRILOSEC) 20 MG capsule Take 20 mg by mouth daily.   ondansetron (ZOFRAN) 4 MG tablet Take 1 tablet (4 mg total) by mouth every 8 (eight) hours as needed for nausea or vomiting.   Homeopathic Products (THERAWORX RELIEF EX) Apply 1 application topically daily as needed (pain). (Patient not taking: Reported on 01/12/2021)   Multiple Vitamin (MULTIVITAMIN WITH MINERALS) TABS tablet Take 1 tablet by mouth daily. (Patient not taking: Reported on 01/12/2021)   No facility-administered encounter medications on file as of 01/12/2021.    Allergies (verified) Codeine, Other, Omeprazole, and Peanut oil   History: Past Medical History:  Diagnosis Date   Allergy    Arthritis    Asthma    as child   Biliary dyskinesia    Blood transfusion without reported diagnosis    Cancer ([place]    Cataract  Past Surgical History:  Procedure Laterality Date   ABDOMINAL HYSTERECTOMY     CHOLECYSTECTOMY N/A 04/12/2015   Procedure: LAPAROSCOPIC CHOLECYSTECTOMY;  Surgeon: Judeth Horn, MD;  Location: [facility];  Service: General;  Laterality: N/A;   COLONOSCOPY N/A 07/31/2017   Procedure: COLONOSCOPY;  Surgeon: Rogene Houston, MD;  [facility];  Service:  Endoscopy;  Laterality: N/A;  [place] N/A 01/16/2019   Procedure: ESOPHAGEAL DILATION;  Surgeon: Rogene Houston, MD;  [facility];  Service: Endoscopy;  Laterality: N/A;   ESOPHAGOGASTRODUODENOSCOPY (EGD) WITH PROPOFOL N/A 01/16/2019   Procedure: ESOPHAGOGASTRODUODENOSCOPY (EGD) WITH PROPOFOL;  Surgeon: Rogene Houston, MD;  [facility];  Service: Endoscopy;  Laterality: N/A;  145pm   EYE SURGERY     cataract with lens replacement   TONSILLECTOMY     Family History  Problem Relation Age of Onset   Arthritis Mother    Cancer Mother    Cancer Father    Hearing loss Sister    Stroke Maternal Grandmother    Kidney disease Paternal Grandmother    Thyroid disease Daughter    Social History   Socioeconomic History   Marital status: Widowed    Spouse name: [name]   Number of children: 3   Years of education: 12th Grade   Highest education level: High school graduate  Occupational History   Occupation: Retired  Tobacco Use   Smoking status: Never   Smokeless tobacco: Never  Vaping Use   Vaping Use: Never used  Substance and Sexual Activity   Alcohol use: No   Drug use: No   Sexual activity: Yes  Other Topics Concern   Not on file  Social History Narrative   Retired. 3 grown children. Enjoys sewing and crafting.    Husband passed away around 02-19-2020   She doesn't drive, but her children drive her and get her anything she needs   Social Determinants of Health   Financial Resource Strain: Low Risk    Difficulty of Paying Living Expenses: Not hard at all  Food Insecurity: No Food Insecurity   Worried About Charity fundraiser in the Last Year: Never true   [place] in the Last Year: Never true  Transportation Needs: No Transportation Needs   Lack of Transportation (Medical): No   Lack of Transportation (Non-Medical): No  Physical Activity: Insufficiently Active   Days of Exercise per Week:  7 days   Minutes of Exercise per Session: 20 min  Stress: No Stress Concern Present   Feeling of Stress : Only a little  Social Connections: Moderately Integrated   Frequency of Communication with Friends and Family: More than three times a week   Frequency of Social Gatherings with Friends and Family: More than three times a week   Attends Religious Services: More than 4 times per year   Active Member of [employer] or Organizations: Yes   Attends Archivist Meetings: More than 4 times per year   Marital Status: Widowed    Tobacco Counseling Counseling given: Not Answered   Clinical Intake:  Pre-visit preparation completed: Yes  Pain : No/denies pain     BMI - recorded: 37.79 Nutritional Status: BMI > 30  Obese Nutritional Risks: Nausea/ vomitting/ diarrhea (intermittent - takes otc meds prn) Diabetes: No  How often do you need to have someone help you when you read instructions, pamphlets,  or other written materials from your doctor or pharmacy?: 1 - Never  Diabetic? No  Interpreter Needed?: No  Information entered by :: Akshath Mccarey, LPN   Activities of Daily Living In your present state of health, do you have any difficulty performing the following activities: 01/12/2021  Hearing? N  Vision? N  Difficulty concentrating or making decisions? N  Walking or climbing stairs? N  Dressing or bathing? N  Doing errands, shopping? N  Preparing Food and eating ? N  Using the Toilet? N  In the past six months, have you accidently leaked urine? Y  Comment mild intermittent - wears pads for protection  Do you have problems with loss of bowel control? N  Managing your Medications? N  Managing your Finances? N  Housekeeping or managing your Housekeeping? N  Some recent data might be hidden    Patient Care Team: [name], DO as PCP - General (Family Medicine) Gari Crown, MD (Unknown Physician Specialty) Warden Fillers, MD as Consulting Physician  (Ophthalmology) Montez Morita, Quillian Quince, MD as Consulting Physician (Gastroenterology) Lamonte Sakai Rose Fillers, MD as Consulting Physician (Pulmonary Disease) Harl Bowie Alphonse Guild, MD as Consulting Physician (Cardiology)  Indicate any recent Medical Services you may have received from other than [facility] providers in the past year (date may be approximate).     Assessment:   This is a routine wellness examination for Maren.  Hearing/Vision screen Hearing Screening - Comments:: Denies hearing difficulties  Vision Screening - Comments:: Wears eyeglasses - up to date with annual eye exams with Dr Katy Fitch  Dietary issues and exercise activities discussed: Current Exercise Habits: Home exercise routine, Type of exercise: walking, Time (Minutes): 10, Frequency (Times/Week): 7, Weekly Exercise (Minutes/Week): 70, Intensity: Mild, Exercise limited by: respiratory conditions(s)   Goals Addressed             This Visit's Progress    Exercise 3x per week (30 min per time)   On track    Try to exercise for 30 minutes 3 times weekly       Depression Screen PHQ 2/9 Scores 01/12/2021 11/18/2020 05/02/2020 03/02/2020 01/12/2020 07/23/2019 01/23/2019  PHQ - 2 Score 2 2 0 2 4 0 1  PHQ- 9 Score 8 15 0 '5 6 2 4    '$ Fall Risk Fall Risk  01/12/2021 11/18/2020 05/02/2020 03/02/2020 01/12/2020  Falls in the past year? 0 0 0 0 0  Number falls in past yr: 0 - - - -  Injury with Fall? 0 - - - -  Risk for fall due to : Impaired vision;Orthopedic patient - - - -  Follow up Falls prevention discussed - - - -    FALL RISK PREVENTION PERTAINING TO THE HOME:  Any stairs in or around the home? Yes  If so, are there any without handrails? No  Home free of loose throw rugs in walkways, pet beds, electrical cords, etc? Yes  Adequate lighting in your home to reduce risk of falls? Yes   ASSISTIVE DEVICES UTILIZED TO PREVENT FALLS:  Life alert? No  Use of a cane, walker or w/c? No  Grab bars in the bathroom? No  Shower chair  or bench in shower? No  Elevated toilet seat or a handicapped toilet? No   TIMED UP AND GO:  Was the test performed? No . Telephonic visit  Cognitive Function: Normal cognitive status assessed by direct observation by this Nurse Health Advisor. No abnormalities found.    MMSE - Mini Mental State Exam 12/31/2017  12/25/2016  Orientation to time 5 5  Orientation to Place 5 5  Registration 3 3  Attention/ Calculation 5 5  Recall 3 3  Language- name 2 objects 2 2  Language- repeat 1 1  Language- follow 3 step command 3 3  Language- read & follow direction 1 1  Write a sentence 1 1  Copy design 1 1  Total score 30 30     6CIT Screen 01/12/2020 01/07/2019  What Year? 0 points 0 points  What month? 0 points 0 points  What time? 0 points 0 points  Count back from 20 0 points 0 points  Months in reverse 0 points 0 points  Repeat phrase 0 points 0 points  Total Score 0 0    Immunizations Immunization History  Administered Date(s) Administered   Fluad Quad(high Dose 65+) 03/02/2020   Influenza, High Dose Seasonal PF 02/02/2019, 02/10/2019   Influenza,inj,Quad PF,6+ Mos 07/23/2018   Influenza,inj,quad, With Preservative 02/17/2018   Influenza-Unspecified 03/02/2015, 03/16/2016, 01/30/2017   Moderna Sars-Covid-2 Vaccination 06/20/2019, 07/20/2019, 03/17/2020, 09/21/2020   Pneumococcal Conjugate-13 10/21/2013   Pneumococcal Polysaccharide-23 02/15/2015   Tdap 01/23/2019, 02/10/2019    TDAP status: Up to date  Flu Vaccine status: Up to date  Pneumococcal vaccine status: Up to date  Covid-19 vaccine status: Completed vaccines  Qualifies for Shingles Vaccine? Yes   Zostavax completed No   Shingrix Completed?: No.    Education has been provided regarding the importance of this vaccine. Patient has been advised to call insurance company to determine out of pocket expense if they have not yet received this vaccine. Advised may also receive vaccine at local pharmacy or Health Dept.  Verbalized acceptance and understanding.  Screening Tests Health Maintenance  Topic Date Due   INFLUENZA VACCINE  12/19/2020   Zoster Vaccines- Shingrix (1 of 2) 02/18/2021 (Originally 12/28/1964)   COVID-19 Vaccine (5 - Booster for Moderna series) 01/22/2021   DEXA SCAN  07/28/2021   MAMMOGRAM  08/03/2021   COLONOSCOPY (Pts 45-42yr Insurance coverage will need to be confirmed)  08/01/2027   TETANUS/TDAP  02/09/2029   Hepatitis C Screening  Completed   PNA vac Low Risk Adult  Completed   HPV VACCINES  Aged Out    Health Maintenance  Health Maintenance Due  Topic Date Due   INFLUENZA VACCINE  12/19/2020    Colorectal cancer screening: Type of screening: Colonoscopy. Completed 07/31/2017. Repeat every 10 years  Mammogram status: Completed 08/03/2020. Repeat every year  Bone Density status: Completed 07/29/2019. Results reflect: Bone density results: OSTEOPENIA. Repeat every 2 years.  Lung Cancer Screening: (Low Dose CT Chest recommended if Age 73-80years, 30 pack-year currently smoking OR have quit w/in 15years.) does not qualify.   Additional Screening:  Hepatitis C Screening: does qualify; Completed 12/19/2016  Vision Screening: Recommended annual ophthalmology exams for early detection of glaucoma and other disorders of the eye. Is the patient up to date with their annual eye exam?  Yes  Who is the provider or what is the name of the office in which the patient attends annual eye exams? [facility] If pt is not established with a provider, would they like to be referred to a provider to establish care? No .   Dental Screening: Recommended annual dental exams for proper oral hygiene  Community Resource Referral / Chronic Care Management: CRR required this visit?  No   CCM required this visit?  No      Plan:     I have personally reviewed  and noted the following in the patient's chart:   Medical and social history Use of alcohol, tobacco or illicit drugs  Current  medications and supplements including opioid prescriptions.  Functional ability and status Nutritional status Physical activity Advanced directives List of other physicians Hospitalizations, surgeries, and ER visits in previous 12 months Vitals Screenings to include cognitive, depression, and falls Referrals and appointments  In addition, I have reviewed and discussed with patient certain preventive protocols, quality metrics, and best practice recommendations. A written personalized care plan for preventive services as well as general preventive health recommendations were provided to patient.     Sandrea Hammond, LPN   [id number]   Nurse Notes: None

## 2021-01-13 ENCOUNTER — Other Ambulatory Visit: Payer: Self-pay

## 2021-01-13 ENCOUNTER — Ambulatory Visit (HOSPITAL_COMMUNITY)
Admission: RE | Admit: 2021-01-13 | Discharge: 2021-01-13 | Disposition: A | Discharge status: Home / Self Care | Payer: Medicare Other | Source: Ambulatory Visit | Attending: Cardiology | Admitting: Cardiology

## 2021-01-13 ENCOUNTER — Encounter (HOSPITAL_COMMUNITY): Payer: Self-pay

## 2021-01-13 DIAGNOSIS — R079 Chest pain, unspecified: Secondary | ICD-10-CM | POA: Diagnosis not present

## 2021-01-13 HISTORY — DX: Systemic involvement of connective tissue, unspecified: M35.9

## 2021-01-13 LAB — NM MYOCAR MULTI W/SPECT W/WALL MOTION / EF
LV dias vol: 45 mL (ref 46–106)
LV sys vol: 10 mL
Nuc Stress EF: 78 %
Peak HR: 108 {beats}/min
RATE: 0.4
Rest HR: 68 {beats}/min
Rest Nuclear Isotope Dose: 10.4 mCi
SDS: 0
SRS: 2
SSS: 2
ST Depression (mm): 0 mm
Stress Nuclear Isotope Dose: 28.5 mCi
TID: 0.97

## 2021-01-13 MED ORDER — SODIUM CHLORIDE FLUSH 0.9 % IV SOLN
INTRAVENOUS | Status: AC
  Administered 2021-01-13: 10 mL via INTRAVENOUS
  Filled 2021-01-13: qty 10

## 2021-01-13 MED ORDER — TECHNETIUM TC 99M TETROFOSMIN IV KIT
10.0000 | PACK | Freq: Once | INTRAVENOUS | Status: AC | PRN
  Administered 2021-01-13: 10.4 via INTRAVENOUS

## 2021-01-13 MED ORDER — REGADENOSON 0.4 MG/5ML IV SOLN
INTRAVENOUS | Status: AC
  Administered 2021-01-13: 0.4 mg via INTRAVENOUS
  Filled 2021-01-13: qty 5

## 2021-01-13 MED ORDER — TECHNETIUM TC 99M TETROFOSMIN IV KIT
30.0000 | PACK | Freq: Once | INTRAVENOUS | Status: AC | PRN
  Administered 2021-01-13: 28.5 via INTRAVENOUS

## 2021-01-24 ENCOUNTER — Telehealth: Payer: Self-pay | Admitting: Cardiology

## 2021-01-24 NOTE — Telephone Encounter (Signed)
Patient requesting results of test done on 8/26

## 2021-01-24 NOTE — Telephone Encounter (Signed)
Normal stress test, no evidence of any blockages as the cause of her symptoms. Needs to f/u with pcp to consider noncardiac causes/ F/u just as needed with [place]   Zandra Abts MD

## 2021-01-24 NOTE — Telephone Encounter (Signed)
Myoview has not been resulted yet. Will forward to provider.

## 2021-01-24 NOTE — Telephone Encounter (Signed)
Pt verbalized understanding. Will follow up with PCP to explore other options.

## 2021-02-15 ENCOUNTER — Encounter: Payer: Self-pay | Admitting: Family Medicine

## 2021-02-15 ENCOUNTER — Other Ambulatory Visit: Payer: Self-pay

## 2021-02-15 ENCOUNTER — Ambulatory Visit (INDEPENDENT_AMBULATORY_CARE_PROVIDER_SITE_OTHER): Payer: Medicare Other | Admitting: Family Medicine

## 2021-02-15 ENCOUNTER — Ambulatory Visit: Payer: Medicare Other | Admitting: Family Medicine

## 2021-02-15 VITALS — BP 135/77 | HR 69 | Temp 98.0°F | Ht 61.0 in | Wt 198.4 lb

## 2021-02-15 DIAGNOSIS — F4321 Adjustment disorder with depressed mood: Secondary | ICD-10-CM | POA: Diagnosis not present

## 2021-02-15 DIAGNOSIS — R519 Headache, unspecified: Secondary | ICD-10-CM

## 2021-02-15 DIAGNOSIS — Z23 Encounter for immunization: Secondary | ICD-10-CM

## 2021-02-15 MED ORDER — ATORVASTATIN CALCIUM 20 MG PO TABS: 20.0000 mg | ORAL_TABLET | Freq: Every day | ORAL | 3 refills | Status: DC

## 2021-02-15 NOTE — Progress Notes (Signed)
Subjective: CC: Routine checkup PCP: [name], DO [place] is a 75 y.o. female presenting to clinic today for:  1.  Morning headaches Patient reports that she has been having intermittent morning headaches.  There is no rhyme or reason to them.  She uses an antihistamine daily.  She does not have any associated nausea, vomiting or blurred vision reported but she often will need a warm compress to the head and to drink tea before they will resolve.  She has been told in the past that she snores.  She feels like she gets restful sleep most days.  Has never had a sleep test.  2.  Grief reaction She is continuing to work through her grief after the passing of her spouse.  She reports good support by her friends and family.  She often tries to stay busy by [place] and crafting.  She is currently working on several items for her family for Christmas.  She was able to spend some time with her great grandson, who reminds her a lot of her spouse.  ROS: Per HPI  Allergies  Allergen Reactions   Codeine Nausea Only   Other     Peas - rash Asparagus - rash Celery - rash   Omeprazole Other (See Comments)    Did not work   Peanut Oil Rash and Other (See Comments)    Congestion with nasal scabs    Past Medical History:  Diagnosis Date   Allergy    Arthritis    Asthma    as child   Biliary dyskinesia    Blood transfusion without reported diagnosis    Cancer ([place])    Cataract    Collagen vascular disease ([place])     Current Outpatient Medications:    albuterol (VENTOLIN HFA) 108 (90 Base) MCG/ACT inhaler, Inhale 2 puffs into the lungs every 4 (four) hours as needed for wheezing or shortness of breath., Disp: 18 g, Rfl: 5   aspirin EC 81 MG tablet, Take 81 mg by mouth daily. Swallow whole., Disp: , Rfl:    atorvastatin (LIPITOR) 20 MG tablet, Take 1 tablet (20 mg total) by mouth daily. (NEEDS TO BE SEEN BEFORE NEXT REFILL), Disp: 90 tablet, Rfl: 0   loratadine  (CLARITIN) 10 MG tablet, Take 10 mg by mouth daily., Disp: , Rfl:    Magnesium 250 MG TABS, Take 250 mg by mouth daily. , Disp: , Rfl:    omeprazole (PRILOSEC) 20 MG capsule, Take 20 mg by mouth daily., Disp: , Rfl:    ondansetron (ZOFRAN) 4 MG tablet, Take 1 tablet (4 mg total) by mouth every 8 (eight) hours as needed for nausea or vomiting., Disp: 30 tablet, Rfl: 1   Homeopathic Products (THERAWORX RELIEF EX), Apply 1 application topically daily as needed (pain). (Patient not taking: No sig reported), Disp: , Rfl:    Multiple Vitamin (MULTIVITAMIN WITH MINERALS) TABS tablet, Take 1 tablet by mouth daily. (Patient not taking: No sig reported), Disp: , Rfl:  Social History   Socioeconomic History   Marital status: Widowed    Spouse name: [name]   Number of children: 3   Years of education: 12th Grade   Highest education level: High school graduate  Occupational History   Occupation: Retired  Tobacco Use   Smoking status: Never   Smokeless tobacco: Never  Vaping Use   Vaping Use: Never used  Substance and Sexual Activity   Alcohol use: No   Drug use: No  Sexual activity: Yes  Other Topics Concern   Not on file  Social History Narrative   Retired. 3 grown children. Enjoys sewing and crafting.    Husband passed away around 02-07-20   She doesn't drive, but her children drive her and get her anything she needs   Social Determinants of Health   Financial Resource Strain: Low Risk    Difficulty of Paying Living Expenses: Not hard at all  Food Insecurity: No Food Insecurity   Worried About Charity fundraiser in the Last Year: Never true   [place] in the Last Year: Never true  Transportation Needs: No Transportation Needs   Lack of Transportation (Medical): No   Lack of Transportation (Non-Medical): No  Physical Activity: Insufficiently Active   Days of Exercise per Week: 7 days   Minutes of Exercise per Session: 20 min  Stress: No Stress Concern Present   Feeling of  Stress : Only a little  Social Connections: Moderately Integrated   Frequency of Communication with Friends and Family: More than three times a week   Frequency of Social Gatherings with Friends and Family: More than three times a week   Attends Religious Services: More than 4 times per year   Active Member of [employer] or Organizations: Yes   Attends Archivist Meetings: More than 4 times per year   Marital Status: Widowed  Human resources officer Violence: Not At Risk   Fear of Current or Ex-Partner: No   Emotionally Abused: No   Physically Abused: No   Sexually Abused: No   Family History  Problem Relation Age of Onset   Arthritis Mother    Cancer Mother    Cancer Father    Hearing loss Sister    Stroke Maternal Grandmother    Kidney disease Paternal Grandmother    Thyroid disease Daughter     Objective: Office vital signs reviewed. BP 135/77   Pulse 69   Temp 98 F (36.7 C)   Ht 5\' 1"  (1.549 m)   Wt 198 lb 6.4 oz (90 kg)   SpO2 95%   BMI 37.49 kg/m   Physical Examination:  General: Awake, alert, well nourished, obese. No acute distress HEENT: Normal; sclera white. Cardio: regular rate and rhythm, S1S2 heard, no murmurs appreciated Pulm: clear to auscultation bilaterally, no wheezes, rhonchi or rales; normal work of breathing on room air Psych: Intermittently tearful when discussing the passing of her spouse. Depression screen [facility] 2/9 02/15/2021 01/12/2021 11/18/2020  Decreased Interest 0 1 1  Down, Depressed, Hopeless 0 1 1  PHQ - 2 Score 0 2 2  Altered sleeping 3 2 3   Tired, decreased energy 2 1 3   Change in appetite 0 2 3  Feeling bad or failure about yourself  0 0 1  Trouble concentrating 0 1 1  Moving slowly or fidgety/restless 0 0 1  Suicidal thoughts 0 0 1  PHQ-9 Score 5 8 15   Difficult doing work/chores Somewhat difficult Somewhat difficult Somewhat difficult  Some recent data might be hidden   GAD 7 : Generalized Anxiety Score 02/15/2021 11/18/2020  03/02/2020  Nervous, Anxious, on Edge 0 2 0  Control/stop worrying 1 3 0  Worry too much - different things 0 3 0  Trouble relaxing 0 3 0  Restless 0 3 0  Easily annoyed or irritable 0 3 0  Afraid - awful might happen 0 3 0  Total GAD 7 Score 1 20 0  Anxiety Difficulty Not difficult  at all Somewhat difficult Not difficult at all   Assessment/ Plan: 75 y.o. female   Morning headache  Need for immunization against influenza - Plan: Flu Vaccine QUAD High Dose(Fluad)  Grief reaction  I wonder if she has an undiagnosed sleep apnea.  We discussed consideration for sleep study but she would like to hold off on this for now  Regarding her grief reaction, I think that she is working through this.  We discussed that I am glad to try and help her with medications and/or counseling should she desire but she seems reluctant to pursue these types of treatments and wants to work on it on her own.  Influenza vaccination administered  Orders Placed This Encounter  Procedures   Flu Vaccine QUAD High Dose(Fluad)   No orders of the defined types were placed in this encounter.    [name], DO [place] [phone]

## 2021-02-15 NOTE — Patient Instructions (Signed)
Sleep Apnea Sleep apnea is a condition in which breathing pauses or becomes shallow during sleep. People with sleep apnea usually snore loudly. They may have times when they gasp and stop breathing for 10 seconds or more during sleep. This may happen many times during the night. Sleep apnea disrupts your sleep and keeps your body from getting the rest that it needs. This condition can increase your risk of certain health problems, including: Heart attack. Stroke. Obesity. Type 2 diabetes. Heart failure. Irregular heartbeat. High blood pressure. The goal of treatment is to help you breathe normally again. What are the causes? The most common cause of sleep apnea is a collapsed or blocked airway. There are three kinds of sleep apnea: Obstructive sleep apnea. This kind is caused by a blocked or collapsed airway. Central sleep apnea. This kind happens when the part of the brain that controls breathing does not send the correct signals to the muscles that control breathing. Mixed sleep apnea. This is a combination of obstructive and central sleep apnea. What increases the risk? You are more likely to develop this condition if you: Are overweight. Smoke. Have a smaller than normal airway. Are older. Are female. Drink alcohol. Take sedatives or tranquilizers. Have a family history of sleep apnea. Have a tongue or tonsils that are larger than normal. What are the signs or symptoms? Symptoms of this condition include: Trouble staying asleep. Loud snoring. Morning headaches. Waking up gasping. Dry mouth or sore throat in the morning. Daytime sleepiness and tiredness. If you have daytime fatigue because of sleep apnea, you may be more likely to have: Trouble concentrating. Forgetfulness. Irritability or mood swings. Personality changes. Feelings of depression. Sexual dysfunction. This may include loss of interest if you are female, or erectile dysfunction if you are female. How is this  diagnosed? This condition may be diagnosed with: A medical history. A physical exam. A series of tests that are done while you are sleeping (sleep study). These tests are usually done in a sleep lab, but they may also be done at home. How is this treated? Treatment for this condition aims to restore normal breathing and to ease symptoms during sleep. It may involve managing health issues that can affect breathing, such as high blood pressure or obesity. Treatment may include: Sleeping on your side. Using a decongestant if you have nasal congestion. Avoiding the use of depressants, including alcohol, sedatives, and narcotics. Losing weight if you are overweight. Making changes to your diet. Quitting smoking. Using a device to open your airway while you sleep, such as: An oral appliance. This is a custom-made mouthpiece that shifts your lower jaw forward. A continuous positive airway pressure (CPAP) device. This device blows air through a mask when you breathe out (exhale). A nasal expiratory positive airway pressure (EPAP) device. This device has valves that you put into each nostril. A bi-level positive airway pressure (BPAP) device. This device blows air through a mask when you breathe in (inhale) and breathe out (exhale). Having surgery if other treatments do not work. During surgery, excess tissue is removed to create a wider airway. Follow these instructions at home: Lifestyle Make any lifestyle changes that your health care provider recommends. Eat a healthy, well-balanced diet. Take steps to lose weight if you are overweight. Avoid using depressants, including alcohol, sedatives, and narcotics. Do not use any products that contain nicotine or tobacco. These products include cigarettes, chewing tobacco, and vaping devices, such as e-cigarettes. If you need help quitting, ask your  health care provider. General instructions Take over-the-counter and prescription medicines only as told  by your health care provider. If you were given a device to open your airway while you sleep, use it only as told by your health care provider. If you are having surgery, make sure to tell your health care provider you have sleep apnea. You may need to bring your device with you. Keep all follow-up visits. This is important. Contact a health care provider if: The device that you received to open your airway during sleep is uncomfortable or does not seem to be working. Your symptoms do not improve. Your symptoms get worse. Get help right away if: You develop: Chest pain. Shortness of breath. Discomfort in your back, arms, or stomach. You have: Trouble speaking. Weakness on one side of your body. Drooping in your face. These symptoms may represent a serious problem that is an emergency. Do not wait to see if the symptoms will go away. Get medical help right away. Call your local emergency services (911 in the [place] Do not drive yourself to the hospital. Summary Sleep apnea is a condition in which breathing pauses or becomes shallow during sleep. The most common cause is a collapsed or blocked airway. The goal of treatment is to restore normal breathing and to ease symptoms during sleep. This information is not intended to replace advice given to you by your health care provider. Make sure you discuss any questions you have with your health care provider. Document Revised: 04/15/2020 Document Reviewed: 04/15/2020 Elsevier Patient Education  2022 [employer].

## 2021-02-21 ENCOUNTER — Ambulatory Visit: Payer: Medicare Other | Admitting: Family Medicine

## 2021-03-16 DIAGNOSIS — Z23 Encounter for immunization: Secondary | ICD-10-CM | POA: Diagnosis not present

## 2021-03-21 ENCOUNTER — Encounter: Payer: Self-pay | Admitting: Family Medicine

## 2021-03-21 ENCOUNTER — Ambulatory Visit (INDEPENDENT_AMBULATORY_CARE_PROVIDER_SITE_OTHER): Payer: Medicare Other | Admitting: Family Medicine

## 2021-03-21 ENCOUNTER — Ambulatory Visit (INDEPENDENT_AMBULATORY_CARE_PROVIDER_SITE_OTHER): Payer: Medicare Other

## 2021-03-21 ENCOUNTER — Other Ambulatory Visit: Payer: Self-pay

## 2021-03-21 VITALS — BP 150/65 | HR 71 | Temp 97.1°F | Ht 61.0 in | Wt 202.0 lb

## 2021-03-21 DIAGNOSIS — R109 Unspecified abdominal pain: Secondary | ICD-10-CM

## 2021-03-21 LAB — URINALYSIS
Bilirubin, UA: NEGATIVE
Glucose, UA: NEGATIVE
Leukocytes,UA: NEGATIVE
Nitrite, UA: NEGATIVE
Protein,UA: NEGATIVE
RBC, UA: NEGATIVE
Specific Gravity, UA: 1.02 (ref 1.005–1.030)
Urobilinogen, Ur: 1 mg/dL (ref 0.2–1.0)
pH, UA: 6.5 (ref 5.0–7.5)

## 2021-03-21 NOTE — Progress Notes (Signed)
Subjective:  Patient ID: Virginia Nash, female    DOB: 1946-03-07  Age: 75 y.o. MRN: [id number]  CC: Flank Pain   HPI Virginia Nash presents for 2 weeks of bilateral flank pain. Radiating to suprapubic area. 10/10 at times. Now it is dull. If she moves around it gets sharp. Can take her breath away it is so bad.   Depression screen [facility] 2/9 03/21/2021 03/21/2021 02/15/2021  Decreased Interest 0 0 0  Down, Depressed, Hopeless 0 0 0  PHQ - 2 Score 0 0 0  Altered sleeping 2 - 3  Tired, decreased energy 2 - 2  Change in appetite 0 - 0  Feeling bad or failure about yourself  0 - 0  Trouble concentrating 0 - 0  Moving slowly or fidgety/restless 0 - 0  Suicidal thoughts 0 - 0  PHQ-9 Score 4 - 5  Difficult doing work/chores Not difficult at all - Somewhat difficult  Some recent data might be hidden    History Prisha has a past medical history of Allergy, Arthritis, Asthma, Biliary dyskinesia, Blood transfusion without reported diagnosis, Cancer ([place]), Cataract, and Collagen vascular disease ([place]).   She has a past surgical history that includes Abdominal hysterectomy; Tonsillectomy; Dilation and curettage of uterus; Eye surgery; Cholecystectomy (N/A, 04/12/2015); Colonoscopy (N/A, 07/31/2017); Esophagogastroduodenoscopy (egd) with propofol (N/A, 01/16/2019); and Esophageal dilation (N/A, 01/16/2019).   Her family history includes Arthritis in her mother; Cancer in her father and mother; Hearing loss in her sister; Kidney disease in her paternal grandmother; Stroke in her maternal grandmother; Thyroid disease in her daughter.She reports that she has never smoked. She has never used smokeless tobacco. She reports that she does not drink alcohol and does not use drugs.    ROS Review of Systems  Constitutional: Negative.   HENT: Negative.    Eyes:  Negative for visual disturbance.  Respiratory:  Negative for shortness of breath.   Cardiovascular:  Negative for chest pain.   Gastrointestinal:  Positive for abdominal pain (bilateral BLQ and flanks).  Musculoskeletal:  Negative for arthralgias.   Objective:  BP (!) 150/65   Pulse 71   Temp (!) 97.1 F (36.2 C)   Ht $R'5\' 1"'Ll$  (1.549 m)   Wt 202 lb (91.6 kg)   SpO2 96%   BMI 38.17 kg/m   BP Readings from Last 3 Encounters:  03/21/21 (!) 150/65  02/15/21 135/77  01/03/21 (!) 144/80    Wt Readings from Last 3 Encounters:  03/21/21 202 lb (91.6 kg)  02/15/21 198 lb 6.4 oz (90 kg)  01/12/21 200 lb (90.7 kg)     Physical Exam Constitutional:      Appearance: She is well-developed.  HENT:     Head: Normocephalic and atraumatic.  Cardiovascular:     Rate and Rhythm: Normal rate and regular rhythm.     Heart sounds: No murmur heard. Pulmonary:     Effort: Pulmonary effort is normal.     Breath sounds: Normal breath sounds.  Abdominal:     General: Bowel sounds are normal.     Palpations: Abdomen is soft. There is no mass.     Tenderness: There is abdominal tenderness. There is no guarding or rebound.  Skin:    General: Skin is warm and dry.  Neurological:     Mental Status: She is alert and oriented to person, place, and time.  Psychiatric:        Behavior: Behavior normal.      Assessment & Plan:  Virginia Nash was seen today for flank pain.  Diagnoses and all orders for this visit:  Bilateral flank pain -     CBC with Differential/Platelet -     CMP14+EGFR -     Urinalysis -     Urine Culture -     DG Abd 2 Views; Future      I am having [name] maintain her multivitamin with minerals, Magnesium, loratadine, Homeopathic Products (THERAWORX RELIEF EX), albuterol, omeprazole, ondansetron, aspirin EC, and atorvastatin.  Allergies as of 03/21/2021       Reactions   Codeine Nausea Only   Other    Peas - rash Asparagus - rash Celery - rash   Omeprazole Other (See Comments)   Did not work   Peanut Oil Rash, Other (See Comments)   Congestion with nasal scabs         Medication List        Accurate as of March 21, 2021 11:39 AM. If you have any questions, ask your nurse or doctor.          albuterol 108 (90 Base) MCG/ACT inhaler Commonly known as: VENTOLIN HFA Inhale 2 puffs into the lungs every 4 (four) hours as needed for wheezing or shortness of breath.   aspirin EC 81 MG tablet Take 81 mg by mouth daily. Swallow whole.   atorvastatin 20 MG tablet Commonly known as: LIPITOR Take 1 tablet (20 mg total) by mouth daily.   loratadine 10 MG tablet Commonly known as: CLARITIN Take 10 mg by mouth daily.   Magnesium 250 MG Tabs Take 250 mg by mouth daily.   multivitamin with minerals Tabs tablet Take 1 tablet by mouth daily.   omeprazole 20 MG capsule Commonly known as: PRILOSEC Take 20 mg by mouth daily.   ondansetron 4 MG tablet Commonly known as: ZOFRAN Take 1 tablet (4 mg total) by mouth every 8 (eight) hours as needed for nausea or vomiting.   THERAWORX RELIEF EX Apply 1 application topically daily as needed (pain).         Follow-up: Return if symptoms worsen or fail to improve.  Virginia Nash, M.D.

## 2021-03-22 LAB — CBC WITH DIFFERENTIAL/PLATELET
Basophils Absolute: 0.1 10*3/uL (ref 0.0–0.2)
Basos: 1 %
EOS (ABSOLUTE): 0.4 10*3/uL (ref 0.0–0.4)
Eos: 6 %
Hematocrit: 40.4 % (ref 34.0–46.6)
Hemoglobin: 13.1 g/dL (ref 11.1–15.9)
Immature Grans (Abs): 0 10*3/uL (ref 0.0–0.1)
Immature Granulocytes: 0 %
Lymphocytes Absolute: 2.1 10*3/uL (ref 0.7–3.1)
Lymphs: 33 %
MCH: 30.8 pg (ref 26.6–33.0)
MCHC: 32.4 g/dL (ref 31.5–35.7)
MCV: 95 fL (ref 79–97)
Monocytes Absolute: 0.6 10*3/uL (ref 0.1–0.9)
Monocytes: 10 %
Neutrophils Absolute: 3.3 10*3/uL (ref 1.4–7.0)
Neutrophils: 50 %
Platelets: 190 10*3/uL (ref 150–450)
RBC: 4.25 x10E6/uL (ref 3.77–5.28)
RDW: 12.3 % (ref 11.7–15.4)
WBC: 6.5 10*3/uL (ref 3.4–10.8)

## 2021-03-22 LAB — CMP14+EGFR
ALT: 18 IU/L (ref 0–32)
AST: 18 IU/L (ref 0–40)
Albumin/Globulin Ratio: 1.9 (ref 1.2–2.2)
Albumin: 4.3 g/dL (ref 3.7–4.7)
Alkaline Phosphatase: 144 IU/L — ABNORMAL HIGH (ref 44–121)
BUN/Creatinine Ratio: 19 (ref 12–28)
BUN: 15 mg/dL (ref 8–27)
Bilirubin Total: 0.8 mg/dL (ref 0.0–1.2)
CO2: 26 mmol/L (ref 20–29)
Calcium: 8.9 mg/dL (ref 8.7–10.3)
Chloride: 106 mmol/L (ref 96–106)
Creatinine, Ser: 0.79 mg/dL (ref 0.57–1.00)
Globulin, Total: 2.3 g/dL (ref 1.5–4.5)
Glucose: 104 mg/dL — ABNORMAL HIGH (ref 70–99)
Potassium: 4.7 mmol/L (ref 3.5–5.2)
Sodium: 143 mmol/L (ref 134–144)
Total Protein: 6.6 g/dL (ref 6.0–8.5)
eGFR: 79 mL/min/{1.73_m2} (ref >59)

## 2021-03-22 NOTE — Progress Notes (Signed)
Hello Virginia Nash,  Your lab result is normal and/or stable.Some minor variations that are not significant are commonly marked abnormal, but do not represent any medical problem for you.  Best regards, Claretta Fraise, M.D.

## 2021-03-23 ENCOUNTER — Telehealth: Payer: Self-pay | Admitting: Family Medicine

## 2021-03-23 NOTE — Telephone Encounter (Signed)
Patient advised to continue with CT scan per Dr. Livia Snellen

## 2021-03-23 NOTE — Telephone Encounter (Signed)
Patient had a visit with Dr. Livia Snellen on 11/1- please advise

## 2021-03-23 NOTE — Telephone Encounter (Signed)
yes

## 2021-03-25 LAB — URINE CULTURE

## 2021-03-31 ENCOUNTER — Ambulatory Visit (HOSPITAL_BASED_OUTPATIENT_CLINIC_OR_DEPARTMENT_OTHER): Payer: Medicare Other

## 2021-04-04 ENCOUNTER — Other Ambulatory Visit: Payer: Self-pay

## 2021-04-04 ENCOUNTER — Encounter (HOSPITAL_BASED_OUTPATIENT_CLINIC_OR_DEPARTMENT_OTHER): Payer: Self-pay

## 2021-04-04 ENCOUNTER — Ambulatory Visit (HOSPITAL_BASED_OUTPATIENT_CLINIC_OR_DEPARTMENT_OTHER)
Admission: RE | Admit: 2021-04-04 | Discharge: 2021-04-04 | Disposition: A | Discharge status: Home / Self Care | Payer: Medicare Other | Source: Ambulatory Visit | Attending: Family Medicine | Admitting: Family Medicine

## 2021-04-04 DIAGNOSIS — R109 Unspecified abdominal pain: Secondary | ICD-10-CM | POA: Insufficient documentation

## 2021-04-04 DIAGNOSIS — K5730 Diverticulosis of large intestine without perforation or abscess without bleeding: Secondary | ICD-10-CM | POA: Diagnosis not present

## 2021-04-04 MED ORDER — IOHEXOL 300 MG/ML  SOLN
85.0000 mL | Freq: Once | INTRAMUSCULAR | Status: AC | PRN
  Administered 2021-04-04: 85 mL via INTRAVENOUS

## 2021-04-27 DIAGNOSIS — Z20822 Contact with and (suspected) exposure to covid-19: Secondary | ICD-10-CM | POA: Diagnosis not present

## 2021-06-15 ENCOUNTER — Other Ambulatory Visit (HOSPITAL_COMMUNITY): Payer: Self-pay | Admitting: Family Medicine

## 2021-06-15 DIAGNOSIS — Z1231 Encounter for screening mammogram for malignant neoplasm of breast: Secondary | ICD-10-CM

## 2021-06-20 DIAGNOSIS — H33321 Round hole, right eye: Secondary | ICD-10-CM | POA: Diagnosis not present

## 2021-06-20 DIAGNOSIS — Z961 Presence of intraocular lens: Secondary | ICD-10-CM | POA: Diagnosis not present

## 2021-06-20 DIAGNOSIS — H40013 Open angle with borderline findings, low risk, bilateral: Secondary | ICD-10-CM | POA: Diagnosis not present

## 2021-06-20 DIAGNOSIS — H04123 Dry eye syndrome of bilateral lacrimal glands: Secondary | ICD-10-CM | POA: Diagnosis not present

## 2021-06-20 DIAGNOSIS — H5213 Myopia, bilateral: Secondary | ICD-10-CM | POA: Diagnosis not present

## 2021-07-17 DIAGNOSIS — Z20822 Contact with and (suspected) exposure to covid-19: Secondary | ICD-10-CM | POA: Diagnosis not present

## 2021-08-01 DIAGNOSIS — Z20822 Contact with and (suspected) exposure to covid-19: Secondary | ICD-10-CM | POA: Diagnosis not present

## 2021-08-15 ENCOUNTER — Ambulatory Visit: Payer: Medicare Other | Admitting: Family Medicine

## 2021-08-22 DIAGNOSIS — Z20822 Contact with and (suspected) exposure to covid-19: Secondary | ICD-10-CM | POA: Diagnosis not present

## 2021-08-24 DIAGNOSIS — Z20822 Contact with and (suspected) exposure to covid-19: Secondary | ICD-10-CM | POA: Diagnosis not present

## 2021-08-28 ENCOUNTER — Encounter: Payer: Self-pay | Admitting: Family Medicine

## 2021-08-28 ENCOUNTER — Ambulatory Visit (INDEPENDENT_AMBULATORY_CARE_PROVIDER_SITE_OTHER): Payer: Medicare Other | Admitting: Family Medicine

## 2021-08-28 VITALS — BP 124/69 | HR 68 | Temp 98.0°F | Ht 61.0 in | Wt 197.0 lb

## 2021-08-28 DIAGNOSIS — G500 Trigeminal neuralgia: Secondary | ICD-10-CM | POA: Diagnosis not present

## 2021-08-28 DIAGNOSIS — G5761 Lesion of plantar nerve, right lower limb: Secondary | ICD-10-CM | POA: Diagnosis not present

## 2021-08-28 DIAGNOSIS — H9193 Unspecified hearing loss, bilateral: Secondary | ICD-10-CM

## 2021-08-28 DIAGNOSIS — M25561 Pain in right knee: Secondary | ICD-10-CM

## 2021-08-28 DIAGNOSIS — G8929 Other chronic pain: Secondary | ICD-10-CM | POA: Diagnosis not present

## 2021-08-28 DIAGNOSIS — H9313 Tinnitus, bilateral: Secondary | ICD-10-CM | POA: Diagnosis not present

## 2021-08-28 NOTE — Progress Notes (Signed)
? ?Subjective: ?CC: Tinnitus, trigeminal neuralgia, chronic right knee pain ?PCP: [name], DO ?[id number] Virginia Nash is a 76 y.o. female presenting to clinic today for: ? ?1.  Chronic right knee pain ?Patient reports that she has been seen by orthopedics at [facility] for chronic right knee pain in the past.  She has had corticosteroid injection and was told that she may need viscosupplementation but she was lost to follow-up secondary to the chronic illness of her spouse.  She reports ongoing right knee pain and does not feel that she had substantial improvement for more than 1 month without corticosteroid injection.  She has used some over-the-counter analgesics with varying degrees of improvement but her daughter reports that she in fact has many days where she is not able to walk secondary to pain and swelling in that right knee ? ?2.  Tinnitus ?Patient reports bilateral tinnitus.  She notes some difficulty with hearing certain frequencies sometimes.  Things seem mumbled. ? ?3.  Facial pain ?Patient with history of trigeminal neuralgia on the left.  She notes that she continues to have intermittent but not very sustained electric-like pains on the left side of her face.  She points to the second branch of the trigeminal nerve as the area of discomfort.  She has is on max twice per day but it seems to becoming more frequent. ? ?4.  Right foot pain/numbness ?Patient reports that she gets some intermittent right numbness and discomfort in the area between her second and third digits on the right foot. ? ? ?ROS: Per HPI ? ?Allergies  ?Allergen Reactions  ? Codeine Nausea Only  ? Other   ?  Peas - rash ?Asparagus - rash ?Celery - rash  ? Omeprazole Other (See Comments)  ?  Did not work  ? Peanut Oil Rash and Other (See Comments)  ?  Congestion with nasal scabs ?  ? ?Past Medical History:  ?Diagnosis Date  ? Allergy   ? Arthritis   ? Asthma   ? as child  ? Biliary dyskinesia   ? Blood transfusion  without reported diagnosis   ? Cancer [facility])   ? Cataract   ? Collagen vascular disease ([place])   ? ? ?Current Outpatient Medications:  ?  albuterol (VENTOLIN HFA) 108 (90 Base) MCG/ACT inhaler, Inhale 2 puffs into the lungs every 4 (four) hours as needed for wheezing or shortness of breath., Disp: 18 g, Rfl: 5 ?  aspirin EC 81 MG tablet, Take 81 mg by mouth daily. Swallow whole., Disp: , Rfl:  ?  atorvastatin (LIPITOR) 20 MG tablet, Take 1 tablet (20 mg total) by mouth daily., Disp: 90 tablet, Rfl: 3 ?  loratadine (CLARITIN) 10 MG tablet, Take 10 mg by mouth daily., Disp: , Rfl:  ?  Magnesium 250 MG TABS, Take 250 mg by mouth daily. , Disp: , Rfl:  ?  omeprazole (PRILOSEC) 20 MG capsule, Take 20 mg by mouth daily., Disp: , Rfl:  ?  ondansetron (ZOFRAN) 4 MG tablet, Take 1 tablet (4 mg total) by mouth every 8 (eight) hours as needed for nausea or vomiting., Disp: 30 tablet, Rfl: 1 ?  Homeopathic Products [facility] RELIEF EX), Apply 1 application topically daily as needed (pain). (Patient not taking: Reported on 01/12/2021), Disp: , Rfl:  ?  Multiple Vitamin (MULTIVITAMIN WITH MINERALS) TABS tablet, Take 1 tablet by mouth daily. (Patient not taking: Reported on 01/12/2021), Disp: , Rfl:  ?Social History  ? ?Socioeconomic History  ? Marital  status: Widowed  ?  Spouse name: [name]  ? Number of children: 3  ? Years of education: 12th Grade  ? Highest education level: High school graduate  ?Occupational History  ? Occupation: Retired  ?Tobacco Use  ? Smoking status: Never  ? Smokeless tobacco: Never  ?Vaping Use  ? Vaping Use: Never used  ?Substance and Sexual Activity  ? Alcohol use: No  ? Drug use: No  ? Sexual activity: Yes  ?Other Topics Concern  ? Not on file  ?Social History Narrative  ? Retired. 3 grown children. Enjoys sewing and crafting.   ? Husband passed away around 02-17-20  ? She doesn't drive, but her children drive her and get her anything she needs  ? ?Social Determinants of Health  ? ?Financial Resource  Strain: Low Risk   ? Difficulty of Paying Living Expenses: Not hard at all  ?Food Insecurity: No Food Insecurity  ? Worried About Charity fundraiser in the Last Year: Never true  ? Ran Out of Food in the Last Year: Never true  ?Transportation Needs: No Transportation Needs  ? Lack of Transportation (Medical): No  ? Lack of Transportation (Non-Medical): No  ?Physical Activity: Insufficiently Active  ? Days of Exercise per Week: 7 days  ? Minutes of Exercise per Session: 20 min  ?Stress: No Stress Concern Present  ? Feeling of Stress : Only a little  ?Social Connections: Moderately Integrated  ? Frequency of Communication with Friends and Family: More than three times a week  ? Frequency of Social Gatherings with Friends and Family: More than three times a week  ? Attends Religious Services: More than 4 times per year  ? Active Member of Clubs or Organizations: Yes  ? Attends Archivist Meetings: More than 4 times per year  ? Marital Status: Widowed  ?Intimate Partner Violence: Not At Risk  ? Fear of Current or Ex-Partner: No  ? Emotionally Abused: No  ? Physically Abused: No  ? Sexually Abused: No  ? ?Family History  ?Problem Relation Age of Onset  ? Arthritis Mother   ? Cancer Mother   ? Cancer Father   ? Hearing loss Sister   ? Stroke Maternal Grandmother   ? Kidney disease Paternal Grandmother   ? Thyroid disease Daughter   ? ? ?Objective: ?Office vital signs reviewed. ?BP 124/69   Pulse 68   Temp 98 ?F (36.7 ?[place])   Ht '5\' 1"'$  (1.549 m)   Wt 197 lb (89.4 kg)   SpO2 97%   BMI 37.22 kg/m?  ? ?Physical Examination:  ?General: Awake, alert, well nourished, No acute distress ?HEENT: TMs intact bilaterally.  No erythema, bulging.  No perforation ?Cardio: regular rate and rhythm  ?Pulm: Normal work of breathing on room air ?MSK: Ambulating independently.  Gait mildly antalgic ? Right foot: Very mild palpable nodule between digits 2 and 3 appreciated consistent with a Morton's neuroma. ?Neuro: No focal  neurologic deficits.  Cranial nerves II through XII grossly intact ? ?Assessment/ Plan: ?76 y.o. female  ? ?Trigeminal neuralgia of left side of face - Plan: Ambulatory referral to ENT ? ?Tinnitus aurium, bilateral - Plan: Ambulatory referral to ENT ? ?Bilateral hearing loss, unspecified hearing loss type - Plan: Ambulatory referral to ENT ? ?Morton's neuroma of right foot ? ?Chronic pain of right knee - Plan: Ambulatory referral to Orthopedic Surgery ? ?I placed a referral to ENT for the hearing loss associated with tinnitus.  Uncertain the impact of her  trigeminal neuralgia on this but I did offer gabapentin if she chooses to proceed with medication.  Her ear exam was unremarkable today.  No appreciable obstructive processes or abnormalities noted at the tympanic membrane ? ?Lesion of the right foot is consistent with a Morton's neuroma.  Handout provided.  Recommended metatarsal pad ? ?Right knee pain is chronic, worsening.  Referral to orthopedics at [facility] placed.  I suspect that they may proceed with viscosupplementation.  We will defer imaging to that appointment as I suspect they will want to obtain plain films. ? ?No orders of the defined types were placed in this encounter. ? ?No orders of the defined types were placed in this encounter. ? ? ? ?[name], DO ?[facility] ?[phone] ? ? ?

## 2021-08-28 NOTE — Patient Instructions (Signed)
Referral to ENT (for ringing/ hearing loss) ?Referral to [facility] for knee pain ?Need to get a Med metatarsal pad to put under the area of discomfort in right shoe for the neuralgia you experience. ? ?Morton Neuralgia ?Morton neuralgia is foot pain that affects the ball of the foot and the area near the toes. Morton neuralgia occurs when part of a nerve in the foot (digital nerve) is under too much pressure (compressed). When this happens over a long period of time, the nerve can thicken (neuroma) and cause pain. Pain usually occurs between the third and fourth toes.  ?Morton neuralgia can come and go but may get worse over time. ?What are the causes? ?This condition is caused by doing the same things over and over with your foot, such as: ?Activities such as running or jumping. ?Wearing shoes that are too tight. ?What increases the risk? ?You may be at higher risk for Morton neuralgia if you: ?Are female. ?Wear high heels. ?Wear shoes that are narrow or tight. ?Do activities that repeatedly stretch your toes, such as: ?Running. ?[place]. ?Long-distance walking. ?What are the signs or symptoms? ?The first symptom of Morton neuralgia is pain that spreads from the ball of the foot to the toes. It may feel like you are walking on a marble. Pain usually gets worse with walking and goes away at night. Other symptoms may include numbness and cramping of your toes. Both feet are equally affected, but rarely at the same time. ?How is this diagnosed? ?This condition is diagnosed based on your symptoms, your medical history, and a physical exam. Your health care provider may: ?Squeeze your foot just behind your toe. ?Ask you to move your toes to check for pain. ?Ask about your physical activity level. ?You also may have imaging tests, such as an X-ray, ultrasound, or MRI. ?How is this treated? ?Treatment depends on how severe your condition is and what causes it. Treatment may involve: ?Wearing different shoes that are  not too tight, are low-heeled, and provide good support. For some people, this is the only treatment needed. ?Wearing an over-the-counter or custom supportive pad (orthotic) under the front of your foot. ?Getting injections of numbing medicine and anti-inflammatory medicine (steroid) in the nerve. ?Having surgery to remove part of the thickened nerve. ?Follow these instructions at home: ?Managing pain, stiffness, and swelling ? ?Massage your foot as needed. ?Wear orthotics as told by your health care provider. ?If directed, put ice on the painful area. To do this: ?Put ice in a plastic bag. ?Place a towel between your skin and the bag. ?Leave the ice on for 20 minutes, 2-3 times a day. ?Remove the ice if your skin turns bright red. This is very important. If you cannot feel pain, heat, or cold, you have a greater risk of damage to the area. ?Raise (elevate) the injured area above the level of your heart while you are sitting or lying down. ?Avoid activities that cause pain or make pain worse. If you play sports, ask your health care provider when it is safe for you to return to sports. ?General instructions ?Take over-the-counter and prescription medicines only as told by your health care provider. ?For the time period you were told by your health care provider, do not drive or use machinery. ?Wear shoes that: ?Have soft soles. ?Have a wide toe area. ?Provide arch support. ?Do not pinch or squeeze your feet. ?Have room for your orthotics, if this applies. ?Keep all follow-up visits. This is  important. ?Contact a health care provider if: ?Your symptoms get worse or do not get better with treatment and home care. ?Summary ?Morton neuralgia is foot pain that affects the ball of the foot and the area near the toes. Pain usually occurs between the third and fourth toes, gets worse with walking, and goes away at night. ?Morton neuralgia occurs when part of a nerve in the foot (digital nerve) is under too much pressure.  When this happens over a long period of time, the nerve can thicken (neuroma) and cause pain. ?This condition is caused by doing the same things over and over with your foot, such as running or jumping, wearing shoes that are too tight, or wearing high heels. ?Treatment may involve wearing low-heeled shoes that are not too tight, wearing a supportive pad (orthotic) under the front of your foot, getting injections in the nerve, or having surgery to remove part of the thickened nerve. ?This information is not intended to replace advice given to you by your health care provider. Make sure you discuss any questions you have with your health care provider. ?Document Revised: 10/14/2020 Document Reviewed: 10/14/2020 ?Elsevier Patient Education ? [employer]. ? ?

## 2021-09-06 ENCOUNTER — Telehealth: Payer: Self-pay | Admitting: Family Medicine

## 2021-09-06 DIAGNOSIS — G500 Trigeminal neuralgia: Secondary | ICD-10-CM

## 2021-09-06 MED ORDER — GABAPENTIN 300 MG PO CAPS
300.0000 mg | ORAL_CAPSULE | Freq: Two times a day (BID) | ORAL | 1 refills | Status: DC | PRN
Start: 2021-09-06 — End: 2022-08-06

## 2021-09-06 NOTE — Telephone Encounter (Signed)
Virginia Nash said that Dr Lajuana Ripple talked to her about a prescription for the pain in her face. She was unsure of the name of the medication but she would like to have it called in, if possible.  ?

## 2021-09-06 NOTE — Telephone Encounter (Signed)
Pt aware.

## 2021-09-11 ENCOUNTER — Ambulatory Visit (HOSPITAL_COMMUNITY)
Admission: RE | Admit: 2021-09-11 | Discharge: 2021-09-11 | Disposition: A | Discharge status: Home / Self Care | Payer: Medicare Other | Source: Ambulatory Visit | Attending: Family Medicine | Admitting: Family Medicine

## 2021-09-11 DIAGNOSIS — Z1231 Encounter for screening mammogram for malignant neoplasm of breast: Secondary | ICD-10-CM | POA: Diagnosis not present

## 2021-09-14 DIAGNOSIS — M25562 Pain in left knee: Secondary | ICD-10-CM | POA: Diagnosis not present

## 2021-09-14 DIAGNOSIS — M25561 Pain in right knee: Secondary | ICD-10-CM | POA: Diagnosis not present

## 2021-11-13 DIAGNOSIS — H903 Sensorineural hearing loss, bilateral: Secondary | ICD-10-CM | POA: Diagnosis not present

## 2021-11-13 DIAGNOSIS — H9313 Tinnitus, bilateral: Secondary | ICD-10-CM | POA: Diagnosis not present

## 2022-01-01 ENCOUNTER — Ambulatory Visit (INDEPENDENT_AMBULATORY_CARE_PROVIDER_SITE_OTHER): Payer: Medicare Other | Admitting: Family Medicine

## 2022-01-01 ENCOUNTER — Encounter: Payer: Self-pay | Admitting: Family Medicine

## 2022-01-01 ENCOUNTER — Ambulatory Visit (INDEPENDENT_AMBULATORY_CARE_PROVIDER_SITE_OTHER): Payer: Medicare Other

## 2022-01-01 VITALS — BP 135/80 | HR 70 | Temp 97.8°F | Wt 202.2 lb

## 2022-01-01 DIAGNOSIS — G4719 Other hypersomnia: Secondary | ICD-10-CM | POA: Diagnosis not present

## 2022-01-01 DIAGNOSIS — N393 Stress incontinence (female) (male): Secondary | ICD-10-CM

## 2022-01-01 DIAGNOSIS — Z780 Asymptomatic menopausal state: Secondary | ICD-10-CM

## 2022-01-01 DIAGNOSIS — Z0000 Encounter for general adult medical examination without abnormal findings: Secondary | ICD-10-CM

## 2022-01-01 DIAGNOSIS — E7800 Pure hypercholesterolemia, unspecified: Secondary | ICD-10-CM | POA: Diagnosis not present

## 2022-01-01 DIAGNOSIS — R739 Hyperglycemia, unspecified: Secondary | ICD-10-CM | POA: Diagnosis not present

## 2022-01-01 DIAGNOSIS — R0683 Snoring: Secondary | ICD-10-CM

## 2022-01-01 DIAGNOSIS — G500 Trigeminal neuralgia: Secondary | ICD-10-CM

## 2022-01-01 DIAGNOSIS — M1711 Unilateral primary osteoarthritis, right knee: Secondary | ICD-10-CM

## 2022-01-01 LAB — BAYER DCA HB A1C WAIVED: HB A1C (BAYER DCA - WAIVED): 6 % — ABNORMAL HIGH (ref 4.8–5.6)

## 2022-01-01 MED ORDER — ATORVASTATIN CALCIUM 20 MG PO TABS: 20.0000 mg | ORAL_TABLET | Freq: Every day | ORAL | 3 refills | Status: DC

## 2022-01-01 MED ORDER — ALBUTEROL SULFATE HFA 108 (90 BASE) MCG/ACT IN AERS: 2.0000 | INHALATION_SPRAY | RESPIRATORY_TRACT | 0 refills | Status: DC | PRN

## 2022-01-01 MED ORDER — OMEPRAZOLE 20 MG PO CPDR: 20.0000 mg | DELAYED_RELEASE_CAPSULE | Freq: Every day | ORAL | 3 refills | Status: DC

## 2022-01-01 NOTE — Progress Notes (Addendum)
Virginia Nash is a 76 y.o. female who is accompanied today's visit by her daughter Wells Guiles.  She presents to office today for annual physical exam examination.    Concerns today include: 1.  Knee pain Patient continues to have right-sided knee pain.  She will be seeing Ortho for possible viscosupplementation.  She has not yet scheduled this follow-up visit.  Last OV was a few months ago.  She reports difficulty with doing her normal activities secondary to the pain and instability.  She has had corticosteroid injections done in the past but this past corticosteroid injection was not helpful.  2.  Daytime fatigue Patient reports that she does have some daytime fatigue.  She is never had a sleep study.  Sometimes if she takes a nap during the day this helps.  Occupation: Retired  Diet: Nonrestricted, Exercise: No structured Last eye exam: Up to date Last dental exam: Up-to-date Last colonoscopy: UTD Last mammogram: UTD Refills needed today: None Immunizations needed: Shingles, COVID, Flu Immunization History  Administered Date(s) Administered   Fluad Quad(high Dose 65+) 03/02/2020, 02/15/2021   Influenza, High Dose Seasonal PF 02/02/2019, 02/10/2019   Influenza,inj,Quad PF,6+ Mos 07/23/2018   Influenza,inj,quad, With Preservative 02/17/2018   Influenza-Unspecified 03/02/2015, 03/16/2016, 01/30/2017   Moderna Sars-Covid-2 Vaccination 06/20/2019, 07/20/2019, 03/17/2020, 09/21/2020   Pneumococcal Conjugate-13 10/21/2013   Pneumococcal Polysaccharide-23 02/15/2015   Tdap 01/23/2019, 02/10/2019     Past Medical History:  Diagnosis Date   Allergy    Arthritis    Asthma    as child   Biliary dyskinesia    Blood transfusion without reported diagnosis    Cancer ([place])    Cataract    Collagen vascular disease ([place])    Social History   Socioeconomic History   Marital status: Widowed    Spouse name: [name]   Number of children: 3   Years of education: 12th Grade   Highest  education level: High school graduate  Occupational History   Occupation: Retired  Tobacco Use   Smoking status: Never   Smokeless tobacco: Never  Vaping Use   Vaping Use: Never used  Substance and Sexual Activity   Alcohol use: No   Drug use: No   Sexual activity: Yes  Other Topics Concern   Not on file  Social History Narrative   Retired. 3 grown children. Enjoys sewing and crafting.    Husband passed away around 02-23-2020   She doesn't drive, but her children drive her and get her anything she needs   Social Determinants of Health   Financial Resource Strain: Low Risk  (01/12/2021)   Overall Financial Resource Strain (CARDIA)    Difficulty of Paying Living Expenses: Not hard at all  Food Insecurity: No Food Insecurity (01/12/2021)   Hunger Vital Sign    Worried About Running Out of Food in the Last Year: Never true    [place] in the Last Year: Never true  Transportation Needs: No Transportation Needs (01/12/2021)   PRAPARE - Hydrologist (Medical): No    Lack of Transportation (Non-Medical): No  Physical Activity: Insufficiently Active (01/12/2021)   Exercise Vital Sign    Days of Exercise per Week: 7 days    Minutes of Exercise per Session: 20 min  Stress: No Stress Concern Present (01/12/2021)   [place]    Feeling of Stress : Only a little  Social Connections: Moderately Integrated (01/12/2021)   Social  Connection and Isolation Panel [NHANES]    Frequency of Communication with Friends and Family: More than three times a week    Frequency of Social Gatherings with Friends and Family: More than three times a week    Attends Religious Services: More than 4 times per year    Active Member of [employer] or Organizations: Yes    Attends Archivist Meetings: More than 4 times per year    Marital Status: Widowed  Intimate Partner Violence: Not At Risk (01/12/2021)    Humiliation, Afraid, Rape, and Kick questionnaire    Fear of Current or Ex-Partner: No    Emotionally Abused: No    Physically Abused: No    Sexually Abused: No   Past Surgical History:  Procedure Laterality Date   ABDOMINAL HYSTERECTOMY     CHOLECYSTECTOMY N/A 04/12/2015   Procedure: LAPAROSCOPIC CHOLECYSTECTOMY;  Surgeon: Judeth Horn, MD;  Location: [facility];  Service: General;  Laterality: N/A;   COLONOSCOPY N/A 07/31/2017   Procedure: COLONOSCOPY;  Surgeon: Rogene Houston, MD;  [facility];  Service: Endoscopy;  Laterality: N/A;  [place] N/A 01/16/2019   Procedure: ESOPHAGEAL DILATION;  Surgeon: Rogene Houston, MD;  [facility];  Service: Endoscopy;  Laterality: N/A;   ESOPHAGOGASTRODUODENOSCOPY (EGD) WITH PROPOFOL N/A 01/16/2019   Procedure: ESOPHAGOGASTRODUODENOSCOPY (EGD) WITH PROPOFOL;  Surgeon: Rogene Houston, MD;  [facility];  Service: Endoscopy;  Laterality: N/A;  145pm   EYE SURGERY     cataract with lens replacement   TONSILLECTOMY     Family History  Problem Relation Age of Onset   Arthritis Mother    Cancer Mother    Cancer Father    Hearing loss Sister    Stroke Maternal Grandmother    Kidney disease Paternal Grandmother    Thyroid disease Daughter     Current Outpatient Medications:    albuterol (VENTOLIN HFA) 108 (90 Base) MCG/ACT inhaler, Inhale 2 puffs into the lungs every 4 (four) hours as needed for wheezing or shortness of breath., Disp: 18 g, Rfl: 5   aspirin EC 81 MG tablet, Take 81 mg by mouth daily. Swallow whole., Disp: , Rfl:    atorvastatin (LIPITOR) 20 MG tablet, Take 1 tablet (20 mg total) by mouth daily., Disp: 90 tablet, Rfl: 3   gabapentin (NEURONTIN) 300 MG capsule, Take 1 capsule (300 mg total) by mouth 2 (two) times daily as needed (trigeminal neuralgia)., Disp: 60 capsule, Rfl: 1   Homeopathic Products (THERAWORX RELIEF EX), Apply  1 application topically daily as needed (pain). (Patient not taking: Reported on 01/12/2021), Disp: , Rfl:    loratadine (CLARITIN) 10 MG tablet, Take 10 mg by mouth daily., Disp: , Rfl:    Magnesium 250 MG TABS, Take 250 mg by mouth daily. , Disp: , Rfl:    Multiple Vitamin (MULTIVITAMIN WITH MINERALS) TABS tablet, Take 1 tablet by mouth daily. (Patient not taking: Reported on 01/12/2021), Disp: , Rfl:    omeprazole (PRILOSEC) 20 MG capsule, Take 20 mg by mouth daily., Disp: , Rfl:    ondansetron (ZOFRAN) 4 MG tablet, Take 1 tablet (4 mg total) by mouth every 8 (eight) hours as needed for nausea or vomiting., Disp: 30 tablet, Rfl: 1  Allergies  Allergen Reactions   Codeine Nausea Only   Other     Peas - rash Asparagus - rash Celery - rash  Omeprazole Other (See Comments)    Did not work   Peanut Oil Rash and Other (See Comments)    Congestion with nasal scabs      ROS: Review of Systems A comprehensive review of systems was negative except for: Genitourinary: positive for urinary incontinence Musculoskeletal: positive for arthralgias    Physical exam BP 135/80   Pulse 70   Temp 97.8 F (36.6 C)   Wt 202 lb 3.2 oz (91.7 kg)   SpO2 96%   BMI 38.21 kg/m  General appearance: alert, cooperative, appears stated age, and no distress Head: Normocephalic, without obvious abnormality, atraumatic Eyes: negative findings: lids and lashes normal, conjunctivae and sclerae normal, corneas clear, and pupils equal, round, reactive to light and accomodation Ears: normal TM's and external ear canals both ears Nose: Nares normal. Septum midline. Mucosa normal. No drainage or sinus tenderness. Throat: lips, mucosa, and tongue normal; teeth and gums normal Neck: no adenopathy, no carotid bruit, supple, symmetrical, trachea midline, and thyroid not enlarged, symmetric, no tenderness/mass/nodules Back: symmetric, no curvature. ROM normal. No CVA tenderness. Lungs: clear to auscultation  bilaterally Heart: regular rate and rhythm, S1, S2 normal, no murmur, click, rub or gallop Abdomen: soft, non-tender; bowel sounds normal; no masses,  no organomegaly Extremities: extremities normal, atraumatic, no cyanosis or edema Pulses: 2+ and symmetric Skin:  Insect bite on the left anterior lower leg Lymph nodes: Cervical, supraclavicular, and axillary nodes normal. Neurologic: Grossly normal MSK: Gait.  Ambulating with minimal assistance  DG [facility] DEXA  Result Date: 01/01/2022 EXAM: DUAL X-RAY ABSORPTIOMETRY (DXA) FOR BONE MINERAL DENSITY 01/01/2022 11:32 am CLINICAL DATA:  76 year old Female Postmenopausal. screen osteoporosis TECHNIQUE: An axial (e.g., hips, spine) and/or appendicular (e.g., radius) exam was performed, as appropriate, using GE Nature conservation officer at [place]. Images are obtained for bone mineral density measurement and are not obtained for diagnostic purposes. [id number] Exclusions: Lumbar spine due to standard deviation. COMPARISON:  07/29/2019. FINDINGS: Scan quality: Good. LEFT FEMORAL NECK: BMD (in g/cm2): 0.905 T-score: -1.0 Z-score: 0.3 LEFT TOTAL HIP: BMD (in g/cm2): 0.994 T-score: -0.1 Z-score: 0.9 RIGHT FEMORAL NECK: BMD (in g/cm2): 0.926 T-score: -0.8 Z-score: 0.5 RIGHT TOTAL HIP: BMD (in g/cm2): 1.030 T-score: 0.2 Z-score: 1.2 Dual femur rate of change from previous exam: No significant rate of change from previous exam. LEFT FOREARM (RADIUS 33%): BMD (in g/cm2): 0.904 T-score: 0.2 Z-score: 0.4 Rate of change from previous exam: No significant rate of change from previous exam. FRAX 10-YEAR PROBABILITY OF FRACTURE: Patient does not meet criteria for FRAX assessment. IMPRESSION: Normal based on BMD. World Health Organization T-score criteria for osteoporosis Normal = T-score at or above -1.0 SD. Low Bone Mass (Osteopenia) = T-score between -1.0 and -2.5 SD. Osteoporosis = T-score at or below -2.5 SD. Fracture risk is not  increased. RECOMMENDATIONS: 1. All patients should optimize calcium and vitamin D intake. 2. Consider FDA-approved medical therapies in postmenopausal women and men aged 70 years and older, based on the following: - A hip or vertebral (clinical or morphometric) fracture - T-score less than or equal to -2.5 and secondary causes have been excluded. - Low bone mass (T-score between -1.0 and -2.5) and a 10-year probability of a hip fracture greater than or equal to 3% or a 10-year probability of a major osteoporosis-related fracture greater than or equal to 20% based on the [place]-adapted WHO algorithm. - Clinician judgment and/or patient preferences may indicate treatment for people with 10-year fracture probabilities above or  below these levels 3. Patients with diagnosis of osteoporosis or at high risk for fracture should have regular bone mineral density tests. For patients eligible for Medicare, routine testing is allowed once every 2 years. The testing frequency can be increased to one year for patients who have rapidly progressing disease, those who are receiving or discontinuing medical therapy to restore bone mass, or have additional risk factors. Electronically Signed   By: Ammie Ferrier M.D.   On: 01/01/2022 11:44    Assessment/ Plan: [name] here for annual physical exam.   Pure hypercholesterolemia - Plan: Lipid Panel, TSH, CMP14+EGFR  Annual physical exam  Morbid obesity ([place]) - Plan: Lipid Panel, TSH, Bayer DCA Hb A1c Waived, CMP14+EGFR  Trigeminal neuralgia of left side of face - Plan: CBC  Asymptomatic postmenopausal estrogen deficiency - Plan: DG [facility] DEXA  Stress incontinence  Snoring  Excessive daytime sleepiness  Primary osteoarthritis of right knee - Plan: Walker rolling  Fasting labs collected today.  A1c shows prediabetes with A1c of 6.0 today.  This is worsening from previous checkups.  Recommend carbohydrate restriction and exercise as tolerated.  Trigeminal  neuralgia continues to be intermittent and she is not using the gabapentin but does have it on hand  DEXA scan does not show any evidence of osteoporosis  Reports stress incontinence and utilizing pads as needed  Discussed consideration for sleep study.  She will contact me to let me know if this is something she wishes to proceed with  Rolling walker ordered to assist with ambulation.  Handicap placard placed up front for daughter to get.  Counseled on healthy lifestyle choices, including diet (rich in fruits, vegetables and lean meats and low in salt and simple carbohydrates) and exercise (at least 30 minutes of moderate physical activity daily).  Patient to follow up in 1 year for annual exam or sooner if needed.  Aman Batley M. Lajuana Ripple, DO

## 2022-01-01 NOTE — Patient Instructions (Addendum)
I think it would be to your benefit to get a sleep study done. You are also due for bone density testing to look for osteoporosis.  This can be scheduled here in the office with Melissa.  Sleep Apnea Sleep apnea is a condition in which breathing pauses or becomes shallow during sleep. People with sleep apnea usually snore loudly. They may have times when they gasp and stop breathing for 10 seconds or more during sleep. This may happen many times during the night. Sleep apnea disrupts your sleep and keeps your body from getting the rest that it needs. This condition can increase your risk of certain health problems, including: Heart attack. Stroke. Obesity. Type 2 diabetes. Heart failure. Irregular heartbeat. High blood pressure. The goal of treatment is to help you breathe normally again. What are the causes?  The most common cause of sleep apnea is a collapsed or blocked airway. There are three kinds of sleep apnea: Obstructive sleep apnea. This kind is caused by a blocked or collapsed airway. Central sleep apnea. This kind happens when the part of the brain that controls breathing does not send the correct signals to the muscles that control breathing. Mixed sleep apnea. This is a combination of obstructive and central sleep apnea. What increases the risk? You are more likely to develop this condition if you: Are overweight. Smoke. Have a smaller than normal airway. Are older. Are female. Drink alcohol. Take sedatives or tranquilizers. Have a family history of sleep apnea. Have a tongue or tonsils that are larger than normal. What are the signs or symptoms? Symptoms of this condition include: Trouble staying asleep. Loud snoring. Morning headaches. Waking up gasping. Dry mouth or sore throat in the morning. Daytime sleepiness and tiredness. If you have daytime fatigue because of sleep apnea, you may be more likely to have: Trouble concentrating. Forgetfulness. Irritability  or mood swings. Personality changes. Feelings of depression. Sexual dysfunction. This may include loss of interest if you are female, or erectile dysfunction if you are female. How is this diagnosed? This condition may be diagnosed with: A medical history. A physical exam. A series of tests that are done while you are sleeping (sleep study). These tests are usually done in a sleep lab, but they may also be done at home. How is this treated? Treatment for this condition aims to restore normal breathing and to ease symptoms during sleep. It may involve managing health issues that can affect breathing, such as high blood pressure or obesity. Treatment may include: Sleeping on your side. Using a decongestant if you have nasal congestion. Avoiding the use of depressants, including alcohol, sedatives, and narcotics. Losing weight if you are overweight. Making changes to your diet. Quitting smoking. Using a device to open your airway while you sleep, such as: An oral appliance. This is a custom-made mouthpiece that shifts your lower jaw forward. A continuous positive airway pressure (CPAP) device. This device blows air through a mask when you breathe out (exhale). A nasal expiratory positive airway pressure (EPAP) device. This device has valves that you put into each nostril. A bi-level positive airway pressure (BIPAP) device. This device blows air through a mask when you breathe in (inhale) and breathe out (exhale). Having surgery if other treatments do not work. During surgery, excess tissue is removed to create a wider airway. Follow these instructions at home: Lifestyle Make any lifestyle changes that your health care provider recommends. Eat a healthy, well-balanced diet. Take steps to lose weight if you  are overweight. Avoid using depressants, including alcohol, sedatives, and narcotics. Do not use any products that contain nicotine or tobacco. These products include cigarettes, chewing  tobacco, and vaping devices, such as e-cigarettes. If you need help quitting, ask your health care provider. General instructions Take over-the-counter and prescription medicines only as told by your health care provider. If you were given a device to open your airway while you sleep, use it only as told by your health care provider. If you are having surgery, make sure to tell your health care provider you have sleep apnea. You may need to bring your device with you. Keep all follow-up visits. This is important. Contact a health care provider if: The device that you received to open your airway during sleep is uncomfortable or does not seem to be working. Your symptoms do not improve. Your symptoms get worse. Get help right away if: You develop: Chest pain. Shortness of breath. Discomfort in your back, arms, or stomach. You have: Trouble speaking. Weakness on one side of your body. Drooping in your face. These symptoms may represent a serious problem that is an emergency. Do not wait to see if the symptoms will go away. Get medical help right away. Call your local emergency services (911 in the [place] Do not drive yourself to the hospital. Summary Sleep apnea is a condition in which breathing pauses or becomes shallow during sleep. The most common cause is a collapsed or blocked airway. The goal of treatment is to restore normal breathing and to ease symptoms during sleep. This information is not intended to replace advice given to you by your health care provider. Make sure you discuss any questions you have with your health care provider. Document Revised: 12/14/2020 Document Reviewed: 04/15/2020 Elsevier Patient Education  [place].

## 2022-01-02 LAB — CMP14+EGFR
ALT: 15 IU/L (ref 0–32)
AST: 16 IU/L (ref 0–40)
Albumin/Globulin Ratio: 2.1 (ref 1.2–2.2)
Albumin: 4.5 g/dL (ref 3.8–4.8)
Alkaline Phosphatase: 123 IU/L — ABNORMAL HIGH (ref 44–121)
BUN/Creatinine Ratio: 20 (ref 12–28)
BUN: 20 mg/dL (ref 8–27)
Bilirubin Total: 0.8 mg/dL (ref 0.0–1.2)
CO2: 24 mmol/L (ref 20–29)
Calcium: 9.4 mg/dL (ref 8.7–10.3)
Chloride: 105 mmol/L (ref 96–106)
Creatinine, Ser: 0.98 mg/dL (ref 0.57–1.00)
Globulin, Total: 2.1 g/dL (ref 1.5–4.5)
Glucose: 109 mg/dL — ABNORMAL HIGH (ref 70–99)
Potassium: 4.7 mmol/L (ref 3.5–5.2)
Sodium: 143 mmol/L (ref 134–144)
Total Protein: 6.6 g/dL (ref 6.0–8.5)
eGFR: 61 mL/min/{1.73_m2} (ref >59)

## 2022-01-02 LAB — LIPID PANEL
Chol/HDL Ratio: 2.6 ratio (ref 0.0–4.4)
Cholesterol, Total: 122 mg/dL (ref 100–199)
HDL: 47 mg/dL (ref >39)
LDL Chol Calc (NIH): 53 mg/dL (ref 0–99)
Triglycerides: 123 mg/dL (ref 0–149)
VLDL Cholesterol Cal: 22 mg/dL (ref 5–40)

## 2022-01-02 LAB — CBC
Hematocrit: 37.8 % (ref 34.0–46.6)
Hemoglobin: 12.7 g/dL (ref 11.1–15.9)
MCH: 31.5 pg (ref 26.6–33.0)
MCHC: 33.6 g/dL (ref 31.5–35.7)
MCV: 94 fL (ref 79–97)
Platelets: 202 10*3/uL (ref 150–450)
RBC: 4.03 x10E6/uL (ref 3.77–5.28)
RDW: 12.8 % (ref 11.7–15.4)
WBC: 6.9 10*3/uL (ref 3.4–10.8)

## 2022-01-02 LAB — TSH: TSH: 3.5 u[IU]/mL (ref 0.450–4.500)

## 2022-01-09 DIAGNOSIS — M1711 Unilateral primary osteoarthritis, right knee: Secondary | ICD-10-CM | POA: Diagnosis not present

## 2022-01-15 ENCOUNTER — Ambulatory Visit (INDEPENDENT_AMBULATORY_CARE_PROVIDER_SITE_OTHER): Payer: Medicare Other

## 2022-01-15 ENCOUNTER — Telehealth: Payer: Self-pay | Admitting: Family Medicine

## 2022-01-15 VITALS — Wt 202.0 lb

## 2022-01-15 DIAGNOSIS — Z0000 Encounter for general adult medical examination without abnormal findings: Secondary | ICD-10-CM

## 2022-01-15 NOTE — Telephone Encounter (Signed)
Nope I'll call her now

## 2022-01-15 NOTE — Telephone Encounter (Signed)
Patient was seen 08/14 would you want to see patient again?

## 2022-01-15 NOTE — Progress Notes (Signed)
Subjective:   Virginia Nash is a 76 y.o. female who presents for Medicare Annual (Subsequent) preventive examination.  Virtual Visit via Telephone Note  I connected with  Virginia Nash on 01/15/22 at 10:30 AM EDT by telephone and verified that I am speaking with the correct person using two identifiers.  Location: Patient: Home Provider: [facility] Persons participating in the virtual visit: patient/Nurse Health Advisor   I discussed the limitations, risks, security and privacy concerns of performing an evaluation and management service by telephone and the availability of in person appointments. The patient expressed understanding and agreed to proceed.  Interactive audio and video telecommunications were attempted between this nurse and patient, however failed, due to patient having technical difficulties OR patient did not have access to video capability.  We continued and completed visit with audio only.  Some vital signs may be absent or patient reported.   Shironda Kain E Cyrah Mclamb, LPN   Review of Systems     Cardiac Risk Factors include: advanced age (>30mn, >>24women);obesity (BMI >30kg/m2);sedentary lifestyle     Objective:    Today's Vitals   01/15/22 1035  Weight: 202 lb (91.6 kg)  PainSc: 4    Body mass index is 38.17 kg/m.     01/15/2022   10:46 AM 01/12/2021   10:41 AM 01/12/2020   10:03 AM 01/12/2020    9:39 AM 01/07/2019    9:38 AM 12/31/2017    9:48 AM 12/25/2016    9:39 AM  Advanced Directives  Does Patient Have a Medical Advance Directive? Yes Yes  Yes Yes Yes Yes  Type of [place]amed[place]of [place] will [place] will  Living will;Out of facility DNR (pink MOST or yellow form) [place] GroveLiving will H[place]Living will [place]Living will  Does patient want to make changes to medical advance directive?   No - Patient declined  No - Patient declined Yes  (MAU/Ambulatory/Procedural Areas - Information given)   Copy of [place] Chart? No - copy requested No - copy requested   No - copy requested No - copy requested No - copy requested    Current Medications (verified) Outpatient Encounter Medications as of 01/15/2022  Medication Sig   albuterol (VENTOLIN HFA) 108 (90 Base) MCG/ACT inhaler Inhale 2 puffs into the lungs every 4 (four) hours as needed for wheezing or shortness of breath.   aspirin EC 81 MG tablet Take 81 mg by mouth daily. Swallow whole.   atorvastatin (LIPITOR) 20 MG tablet Take 1 tablet (20 mg total) by mouth daily.   Calcium Carbonate-Vit D-Min (CALCIUM 1200 PO) Take by mouth.   Cholecalciferol (VITAMIN D3) 25 MCG (1000 UT) CAPS Take by mouth.   loratadine (CLARITIN) 10 MG tablet Take 10 mg by mouth daily.   omeprazole (PRILOSEC) 20 MG capsule Take 1 capsule (20 mg total) by mouth daily.   gabapentin (NEURONTIN) 300 MG capsule Take 1 capsule (300 mg total) by mouth 2 (two) times daily as needed (trigeminal neuralgia). (Patient not taking: Reported on 01/15/2022)   ondansetron (ZOFRAN) 4 MG tablet Take 1 tablet (4 mg total) by mouth every 8 (eight) hours as needed for nausea or vomiting. (Patient not taking: Reported on 01/15/2022)   [DISCONTINUED] Homeopathic Products (THERAWORX RELIEF EX) Apply 1 application topically daily as needed (pain). (Patient not taking: Reported on 01/12/2021)   [DISCONTINUED] Magnesium 250 MG TABS Take 250 mg by mouth daily.  (Patient  not taking: Reported on 01/15/2022)   No facility-administered encounter medications on file as of 01/15/2022.    Allergies (verified) Asparagus, Codeine, Other, and Peanut oil   History: Past Medical History:  Diagnosis Date   Allergy    Arthritis    Asthma    as child   Biliary dyskinesia    Blood transfusion without reported diagnosis    Cancer ([place])    Cataract    Collagen vascular disease ([place])    Past Surgical History:  Procedure  Laterality Date   ABDOMINAL HYSTERECTOMY     CHOLECYSTECTOMY N/A 04/12/2015   Procedure: LAPAROSCOPIC CHOLECYSTECTOMY;  Surgeon: Judeth Horn, MD;  Location: [facility];  Service: General;  Laterality: N/A;   COLONOSCOPY N/A 07/31/2017   Procedure: COLONOSCOPY;  Surgeon: Rogene Houston, MD;  [facility];  Service: Endoscopy;  Laterality: N/A;  [place] N/A 01/16/2019   Procedure: ESOPHAGEAL DILATION;  Surgeon: Rogene Houston, MD;  [facility];  Service: Endoscopy;  Laterality: N/A;   ESOPHAGOGASTRODUODENOSCOPY (EGD) WITH PROPOFOL N/A 01/16/2019   Procedure: ESOPHAGOGASTRODUODENOSCOPY (EGD) WITH PROPOFOL;  Surgeon: Rogene Houston, MD;  [facility];  Service: Endoscopy;  Laterality: N/A;  145pm   EYE SURGERY     cataract with lens replacement   TONSILLECTOMY     Family History  Problem Relation Age of Onset   Arthritis Mother    Cancer Mother    Cancer Father    Hearing loss Sister    Stroke Maternal Grandmother    Kidney disease Paternal Grandmother    Thyroid disease Daughter    Social History   Socioeconomic History   Marital status: Widowed    Spouse name: [name]   Number of children: 3   Years of education: 12th Grade   Highest education level: High school graduate  Occupational History   Occupation: Retired  Tobacco Use   Smoking status: Never   Smokeless tobacco: Never  Vaping Use   Vaping Use: Never used  Substance and Sexual Activity   Alcohol use: No   Drug use: No   Sexual activity: Yes  Other Topics Concern   Not on file  Social History Narrative   Retired. 3 grown children. Enjoys sewing and crafting.    Husband passed away around 02-24-20   She doesn't drive, but her children drive her and get her anything she needs   Social Determinants of Health   Financial Resource Strain: Low Risk  (01/15/2022)   Overall Financial Resource Strain (CARDIA)     Difficulty of Paying Living Expenses: Not hard at all  Food Insecurity: No Food Insecurity (01/15/2022)   Hunger Vital Sign    Worried About Running Out of Food in the Last Year: Never true    [place] in the Last Year: Never true  Transportation Needs: No Transportation Needs (01/15/2022)   PRAPARE - Hydrologist (Medical): No    Lack of Transportation (Non-Medical): No  Physical Activity: Insufficiently Active (01/15/2022)   Exercise Vital Sign    Days of Exercise per Week: 7 days    Minutes of Exercise per Session: 20 min  Stress: No Stress Concern Present (01/15/2022)   [place]    Feeling of Stress : Not at all  Social Connections: Socially Isolated (01/15/2022)   Social Connection and Isolation  Panel [NHANES]    Frequency of Communication with Friends and Family: More than three times a week    Frequency of Social Gatherings with Friends and Family: More than three times a week    Attends Religious Services: Never    Marine scientist or Organizations: No    Attends Archivist Meetings: Never    Marital Status: Widowed    Tobacco Counseling Counseling given: Not Answered   Clinical Intake:  Pre-visit preparation completed: Yes  Pain : 0-10 Pain Score: 4  Pain Type: Chronic pain Pain Location: Knee Pain Orientation: Right, Left Pain Descriptors / Indicators: Aching, Discomfort Pain Onset: More than a month ago Pain Frequency: Intermittent     BMI - recorded: 38.17 Nutritional Status: BMI > 30  Obese Nutritional Risks: None Diabetes: No  How often do you need to have someone help you when you read instructions, pamphlets, or other written materials from your doctor or pharmacy?: 1 - Never  Diabetic? no  Interpreter Needed?: No  Information entered by :: Reiner Loewen, LPN   Activities of Daily Living    01/15/2022   10:45 AM  In your present  state of health, do you have any difficulty performing the following activities:  Hearing? 0  Vision? 0  Difficulty concentrating or making decisions? 0  Walking or climbing stairs? 1  Comment hurts knees  Dressing or bathing? 0  Doing errands, shopping? 1  Comment children drive her  Preparing Food and eating ? N  Using the Toilet? N  In the past six months, have you accidently leaked urine? Y  Comment wears pads for protection  Do you have problems with loss of bowel control? N  Managing your Medications? N  Managing your Finances? N  Housekeeping or managing your Housekeeping? N    Patient Care Team: [name], DO as PCP - General (Family Medicine) Gari Crown, MD (Unknown Physician Specialty) Warden Fillers, MD as Consulting Physician (Ophthalmology) Montez Morita, Quillian Quince, MD as Consulting Physician (Gastroenterology) Lamonte Sakai Rose Fillers, MD as Consulting Physician (Pulmonary Disease) Harl Bowie Alphonse Guild, MD as Consulting Physician (Cardiology)  Indicate any recent Medical Services you may have received from other than [facility] providers in the past year (date may be approximate).     Assessment:   This is a routine wellness examination for Karis.  Hearing/Vision screen Hearing Screening - Comments:: C/o very mild hearing difficulties; does have chronic tinnitus Vision Screening - Comments:: Wears rx glasses - up to date with routine eye exams with [facility]  Dietary issues and exercise activities discussed: Current Exercise Habits: Home exercise routine, Type of exercise: walking;stretching, Time (Minutes): 20, Frequency (Times/Week): 7, Weekly Exercise (Minutes/Week): 140, Intensity: Mild, Exercise limited by: orthopedic condition(s)   Goals Addressed             This Visit's Progress    Exercise 3x per week (30 min per time)       Hopes to get surgery on knees so she can get around better - Try to exercise for 30 minutes 3 times weekly when you are  able Get home organized       Depression Screen    01/15/2022   10:48 AM 01/01/2022   11:13 AM 08/28/2021    9:59 AM 03/21/2021   10:29 AM 03/21/2021   10:16 AM 02/15/2021   11:19 AM 01/12/2021   10:56 AM  PHQ 2/9 Scores  PHQ - 2 Score 0 0 0 0 0 0 2  PHQ-  9 Score 0  '5 4  5 8    '$ Fall Risk    01/15/2022   10:38 AM 01/01/2022   11:13 AM 08/28/2021    9:59 AM 03/21/2021   10:16 AM 02/15/2021   11:19 AM  Fall Risk   Falls in the past year? 1 0 0 0 0  Number falls in past yr: 0      Injury with Fall? 0      Risk for fall due to : Orthopedic patient;Medication side effect;History of fall(s)      Follow up Falls prevention discussed;Education provided        FALL RISK PREVENTION PERTAINING TO THE HOME:  Any stairs in or around the home? No  If so, are there any without handrails? No  Home free of loose throw rugs in walkways, pet beds, electrical cords, etc? Yes  Adequate lighting in your home to reduce risk of falls? Yes   ASSISTIVE DEVICES UTILIZED TO PREVENT FALLS:  Life alert? No  Use of a cane, walker or w/c? No  Grab bars in the bathroom? No  Shower chair or bench in shower? Yes  Elevated toilet seat or a handicapped toilet? No   TIMED UP AND GO:  Was the test performed? No . Telephonic visit  Cognitive Function:    12/31/2017    9:23 AM 12/25/2016    9:42 AM  MMSE - Mini Mental State Exam  Orientation to time 5 5  Orientation to Place 5 5  Registration 3 3  Attention/ Calculation 5 5  Recall 3 3  Language- name 2 objects 2 2  Language- repeat 1 1  Language- follow 3 step command 3 3  Language- read & follow direction 1 1  Write a sentence 1 1  Copy design 1 1  Total score 30 30        01/15/2022   10:50 AM 01/12/2020    9:46 AM 01/07/2019    9:39 AM  6CIT Screen  What Year? 0 points 0 points 0 points  What month? 0 points 0 points 0 points  What time? 0 points 0 points 0 points  Count back from 20 0 points 0 points 0 points  Months in reverse 0 points  0 points 0 points  Repeat phrase 0 points 0 points 0 points  Total Score 0 points 0 points 0 points    Immunizations Immunization History  Administered Date(s) Administered   Fluad Quad(high Dose 65+) 03/02/2020, 02/15/2021   Influenza, High Dose Seasonal PF 02/02/2019, 02/10/2019   Influenza,inj,Quad PF,6+ Mos 07/23/2018   Influenza,inj,quad, With Preservative 02/17/2018   Influenza-Unspecified 03/02/2015, 03/16/2016, 01/30/2017   Moderna Sars-Covid-2 Vaccination 06/20/2019, 07/20/2019, 03/17/2020, 09/21/2020   Pneumococcal Conjugate-13 10/21/2013   Pneumococcal Polysaccharide-23 02/15/2015   Tdap 01/23/2019, 02/10/2019   Zoster Recombinat (Shingrix) 02/01/2021, 05/01/2021    TDAP status: Up to date  Flu Vaccine status: Up to date  Pneumococcal vaccine status: Up to date  Covid-19 vaccine status: Completed vaccines  Qualifies for Shingles Vaccine? Yes   Zostavax completed Yes   Shingrix Completed?: Yes  Screening Tests Health Maintenance  Topic Date Due   INFLUENZA VACCINE  12/19/2021   COVID-19 Vaccine (5 - Moderna series) 01/17/2022 (Originally 11/16/2020)   MAMMOGRAM  09/12/2022   DEXA SCAN  01/02/2024   COLONOSCOPY (Pts 45-59yr Insurance coverage will need to be confirmed)  08/01/2027   TETANUS/TDAP  02/09/2029   Pneumonia Vaccine 76 Years old  Completed   Hepatitis C Screening  Completed   Zoster Vaccines- Shingrix  Completed   HPV VACCINES  Aged Out    Health Maintenance  Health Maintenance Due  Topic Date Due   INFLUENZA VACCINE  12/19/2021    Colorectal cancer screening: Type of screening: Colonoscopy. Completed 07/31/2017. Repeat every 10 years  Mammogram status: Completed 09/11/2021. Repeat every year  Bone Density status: Completed 01/01/2022. Results reflect: Bone density results: NORMAL. Repeat every 2 years.  Lung Cancer Screening: (Low Dose CT Chest recommended if Age 37-80 years, 30 pack-year currently smoking OR have quit w/in 15years.) does  not qualify.   Additional Screening:  Hepatitis C Screening: does qualify; Completed 12/19/2016  Vision Screening: Recommended annual ophthalmology exams for early detection of glaucoma and other disorders of the eye. Is the patient up to date with their annual eye exam?  Yes  Who is the provider or what is the name of the office in which the patient attends annual eye exams? [facility] If pt is not established with a provider, would they like to be referred to a provider to establish care? No .   Dental Screening: Recommended annual dental exams for proper oral hygiene  Community Resource Referral / Chronic Care Management: CRR required this visit?  No   CCM required this visit?  No      Plan:     I have personally reviewed and noted the following in the patient's chart:   Medical and social history Use of alcohol, tobacco or illicit drugs  Current medications and supplements including opioid prescriptions. Patient is not currently taking opioid prescriptions. Functional ability and status Nutritional status Physical activity Advanced directives List of other physicians Hospitalizations, surgeries, and ER visits in previous 12 months Vitals Screenings to include cognitive, depression, and falls Referrals and appointments  In addition, I have reviewed and discussed with patient certain preventive protocols, quality metrics, and best practice recommendations. A written personalized care plan for preventive services as well as general preventive health recommendations were provided to patient.     Sandrea Hammond, LPN   [id number]   Nurse Notes: None

## 2022-01-15 NOTE — Telephone Encounter (Signed)
Spoke to patient advised that it was noted in the chart that she had it done outside of our system in 2020. Patient does not feel that is correct. I checked state website and it has [facility] - patient states that that must be correct, she remembers that the last time she had it was there when her grandchildren were younger.  Patient will like to have done when she comes in for an ov.

## 2022-01-15 NOTE — Patient Instructions (Signed)
Virginia Nash , Thank you for taking time to come for your Medicare Wellness Visit. I appreciate your ongoing commitment to your health goals. Please review the following plan we discussed and let me know if I can assist you in the future.   Screening recommendations/referrals: Colonoscopy: Done 07/31/2017 - Repeat in 10 years  Mammogram: Done 09/11/2021 - Repeat annually Bone Density: Done 01/01/2022 - Repeat every 2 years Recommended yearly ophthalmology/optometry visit for glaucoma screening and checkup Recommended yearly dental visit for hygiene and checkup  Vaccinations: Influenza vaccine: Done 02/15/2021 - Repeat annually  Pneumococcal vaccine: Done 10/21/2013 & 02/15/2015 Tdap vaccine: Done 02/10/2019 - Repeat in 10 years  Shingles vaccine: Done 02/01/2021 & 05/01/2021   Covid-19:Done  06/20/2019, 07/20/2019, 03/17/2020, 09/21/2020  Advanced directives: Please bring a copy of your health care power of attorney and living will to the office to be added to your chart at your convenience.   Conditions/risks identified: Aim for 30 minutes of exercise or brisk walking, 6-8 glasses of water, and 5 servings of fruits and vegetables each day.   Next appointment: Follow up in one year for your annual wellness visit    Preventive Care 65 Years and Older, Female Preventive care refers to lifestyle choices and visits with your health care provider that can promote health and wellness. What does preventive care include? A yearly physical exam. This is also called an annual well check. Dental exams once or twice a year. Routine eye exams. Ask your health care provider how often you should have your eyes checked. Personal lifestyle choices, including: Daily care of your teeth and gums. Regular physical activity. Eating a healthy diet. Avoiding tobacco and drug use. Limiting alcohol use. Practicing safe sex. Taking low-dose aspirin every day. Taking vitamin and mineral supplements as recommended by  your health care provider. What happens during an annual well check? The services and screenings done by your health care provider during your annual well check will depend on your age, overall health, lifestyle risk factors, and family history of disease. Counseling  Your health care provider may ask you questions about your: Alcohol use. Tobacco use. Drug use. Emotional well-being. Home and relationship well-being. Sexual activity. Eating habits. History of falls. Memory and ability to understand (cognition). Work and work Statistician. Reproductive health. Screening  You may have the following tests or measurements: Height, weight, and BMI. Blood pressure. Lipid and cholesterol levels. These may be checked every 5 years, or more frequently if you are over 65 years old. Skin check. Lung cancer screening. You may have this screening every year starting at age 13 if you have a 30-pack-year history of smoking and currently smoke or have quit within the past 15 years. Fecal occult blood test (FOBT) of the stool. You may have this test every year starting at age 54. Flexible sigmoidoscopy or colonoscopy. You may have a sigmoidoscopy every 5 years or a colonoscopy every 10 years starting at age 15. Hepatitis C blood test. Hepatitis B blood test. Sexually transmitted disease (STD) testing. Diabetes screening. This is done by checking your blood sugar (glucose) after you have not eaten for a while (fasting). You may have this done every 1-3 years. Bone density scan. This is done to screen for osteoporosis. You may have this done starting at age 57. Mammogram. This may be done every 1-2 years. Talk to your health care provider about how often you should have regular mammograms. Talk with your health care provider about your test results, treatment options,  and if necessary, the need for more tests. Vaccines  Your health care provider may recommend certain vaccines, such as: Influenza  vaccine. This is recommended every year. Tetanus, diphtheria, and acellular pertussis (Tdap, Td) vaccine. You may need a Td booster every 10 years. Zoster vaccine. You may need this after age 64. Pneumococcal 13-valent conjugate (PCV13) vaccine. One dose is recommended after age 19. Pneumococcal polysaccharide (PPSV23) vaccine. One dose is recommended after age 58. Talk to your health care provider about which screenings and vaccines you need and how often you need them. This information is not intended to replace advice given to you by your health care provider. Make sure you discuss any questions you have with your health care provider. Document Released: 06/03/2015 Document Revised: 01/25/2016 Document Reviewed: 03/08/2015 Elsevier Interactive Patient Education  2017 [place] Prevention in the Home Falls can cause injuries. They can happen to people of all ages. There are many things you can do to make your home safe and to help prevent falls. What can I do on the outside of my home? Regularly fix the edges of walkways and driveways and fix any cracks. Remove anything that might make you trip as you walk through a door, such as a raised step or threshold. Trim any bushes or trees on the path to your home. Use bright outdoor lighting. Clear any walking paths of anything that might make someone trip, such as rocks or tools. Regularly check to see if handrails are loose or broken. Make sure that both sides of any steps have handrails. Any raised decks and porches should have guardrails on the edges. Have any leaves, snow, or ice cleared regularly. Use sand or salt on walking paths during winter. Clean up any spills in your garage right away. This includes oil or grease spills. What can I do in the bathroom? Use night lights. Install grab bars by the toilet and in the tub and shower. Do not use towel bars as grab bars. Use non-skid mats or decals in the tub or shower. If you  need to sit down in the shower, use a plastic, non-slip stool. Keep the floor dry. Clean up any water that spills on the floor as soon as it happens. Remove soap buildup in the tub or shower regularly. Attach bath mats securely with double-sided non-slip rug tape. Do not have throw rugs and other things on the floor that can make you trip. What can I do in the bedroom? Use night lights. Make sure that you have a light by your bed that is easy to reach. Do not use any sheets or blankets that are too big for your bed. They should not hang down onto the floor. Have a firm chair that has side arms. You can use this for support while you get dressed. Do not have throw rugs and other things on the floor that can make you trip. What can I do in the kitchen? Clean up any spills right away. Avoid walking on wet floors. Keep items that you use a lot in easy-to-reach places. If you need to reach something above you, use a strong step stool that has a grab bar. Keep electrical cords out of the way. Do not use floor polish or wax that makes floors slippery. If you must use wax, use non-skid floor wax. Do not have throw rugs and other things on the floor that can make you trip. What can I do with my stairs? Do not leave  any items on the stairs. Make sure that there are handrails on both sides of the stairs and use them. Fix handrails that are broken or loose. Make sure that handrails are as long as the stairways. Check any carpeting to make sure that it is firmly attached to the stairs. Fix any carpet that is loose or worn. Avoid having throw rugs at the top or bottom of the stairs. If you do have throw rugs, attach them to the floor with carpet tape. Make sure that you have a light switch at the top of the stairs and the bottom of the stairs. If you do not have them, ask someone to add them for you. What else can I do to help prevent falls? Wear shoes that: Do not have high heels. Have rubber  bottoms. Are comfortable and fit you well. Are closed at the toe. Do not wear sandals. If you use a stepladder: Make sure that it is fully opened. Do not climb a closed stepladder. Make sure that both sides of the stepladder are locked into place. Ask someone to hold it for you, if possible. Clearly mark and make sure that you can see: Any grab bars or handrails. First and last steps. Where the edge of each step is. Use tools that help you move around (mobility aids) if they are needed. These include: Canes. Walkers. Scooters. Crutches. Turn on the lights when you go into a dark area. Replace any light bulbs as soon as they burn out. Set up your furniture so you have a clear path. Avoid moving your furniture around. If any of your floors are uneven, fix them. If there are any pets around you, be aware of where they are. Review your medicines with your doctor. Some medicines can make you feel dizzy. This can increase your chance of falling. Ask your doctor what other things that you can do to help prevent falls. This information is not intended to replace advice given to you by your health care provider. Make sure you discuss any questions you have with your health care provider. Document Released: 03/03/2009 Document Revised: 10/13/2015 Document Reviewed: 06/11/2014 Elsevier Interactive Patient Education  2017 [employer].

## 2022-01-15 NOTE — Addendum Note (Signed)
Addended by: [name] on: 01/15/2022 02:25 PM   Modules accepted: Orders

## 2022-01-24 DIAGNOSIS — M1711 Unilateral primary osteoarthritis, right knee: Secondary | ICD-10-CM | POA: Diagnosis not present

## 2022-01-31 DIAGNOSIS — M1711 Unilateral primary osteoarthritis, right knee: Secondary | ICD-10-CM | POA: Diagnosis not present

## 2022-02-07 DIAGNOSIS — M1711 Unilateral primary osteoarthritis, right knee: Secondary | ICD-10-CM | POA: Diagnosis not present

## 2022-02-19 DIAGNOSIS — M1711 Unilateral primary osteoarthritis, right knee: Secondary | ICD-10-CM | POA: Diagnosis not present

## 2022-03-27 ENCOUNTER — Encounter: Payer: Self-pay | Admitting: Family

## 2022-03-27 ENCOUNTER — Ambulatory Visit (INDEPENDENT_AMBULATORY_CARE_PROVIDER_SITE_OTHER): Payer: Medicare Other | Admitting: Family

## 2022-03-27 VITALS — BP 136/70 | HR 73 | Temp 96.9°F | Ht 61.0 in | Wt 199.6 lb

## 2022-03-27 DIAGNOSIS — M545 Low back pain, unspecified: Secondary | ICD-10-CM | POA: Diagnosis not present

## 2022-03-27 DIAGNOSIS — Z23 Encounter for immunization: Secondary | ICD-10-CM

## 2022-03-27 MED ORDER — DICLOFENAC SODIUM 75 MG PO TBEC: 75.0000 mg | DELAYED_RELEASE_TABLET | Freq: Two times a day (BID) | ORAL | 0 refills | Status: DC

## 2022-03-27 MED ORDER — PREDNISONE 20 MG PO TABS: 40.0000 mg | ORAL_TABLET | Freq: Every day | ORAL | 0 refills | Status: AC

## 2022-03-27 NOTE — Patient Instructions (Signed)
Acute Back Pain, Adult Acute back pain is sudden and usually short-lived. It is often caused by an injury to the muscles and tissues in the back. The injury may result from: A muscle, tendon, or ligament getting overstretched or torn. Ligaments are tissues that connect bones to each other. Lifting something improperly can cause a back strain. Wear and tear (degeneration) of the spinal disks. Spinal disks are circular tissue that provide cushioning between the bones of the spine (vertebrae). Twisting motions, such as while playing sports or doing yard work. A hit to the back. Arthritis. You may have a physical exam, lab tests, and imaging tests to find the cause of your pain. Acute back pain usually goes away with rest and home care. Follow these instructions at home: Managing pain, stiffness, and swelling Take over-the-counter and prescription medicines only as told by your health care provider. Treatment may include medicines for pain and inflammation that are taken by mouth or applied to the skin, or muscle relaxants. Your health care provider may recommend applying ice during the first 24-48 hours after your pain starts. To do this: Put ice in a plastic bag. Place a towel between your skin and the bag. Leave the ice on for 20 minutes, 2-3 times a day. Remove the ice if your skin turns bright red. This is very important. If you cannot feel pain, heat, or cold, you have a greater risk of damage to the area. If directed, apply heat to the affected area as often as told by your health care provider. Use the heat source that your health care provider recommends, such as a moist heat pack or a heating pad. Place a towel between your skin and the heat source. Leave the heat on for 20-30 minutes. Remove the heat if your skin turns bright red. This is especially important if you are unable to feel pain, heat, or cold. You have a greater risk of getting burned. Activity  Do not stay in bed. Staying in  bed for more than 1-2 days can delay your recovery. Sit up and stand up straight. Avoid leaning forward when you sit or hunching over when you stand. If you work at a desk, sit close to it so you do not need to lean over. Keep your chin tucked in. Keep your neck drawn back, and keep your elbows bent at a 90-degree angle (right angle). Sit high and close to the steering wheel when you drive. Add lower back (lumbar) support to your car seat, if needed. Take short walks on even surfaces as soon as you are able. Try to increase the length of time you walk each day. Do not sit, drive, or stand in one place for more than 30 minutes at a time. Sitting or standing for long periods of time can put stress on your back. Do not drive or use heavy machinery while taking prescription pain medicine. Use proper lifting techniques. When you bend and lift, use positions that put less stress on your back: Bend your knees. Keep the load close to your body. Avoid twisting. Exercise regularly as told by your health care provider. Exercising helps your back heal faster and helps prevent back injuries by keeping muscles strong and flexible. Work with a physical therapist to make a safe exercise program, as recommended by your health care provider. Do any exercises as told by your physical therapist. Lifestyle Maintain a healthy weight. Extra weight puts stress on your back and makes it difficult to have good   posture. Avoid activities or situations that make you feel anxious or stressed. Stress and anxiety increase muscle tension and can make back pain worse. Learn ways to manage anxiety and stress, such as through exercise. General instructions Sleep on a firm mattress in a comfortable position. Try lying on your side with your knees slightly bent. If you lie on your back, put a pillow under your knees. Keep your head and neck in a straight line with your spine (neutral position) when using electronic equipment like  smartphones or pads. To do this: Raise your smartphone or pad to look at it instead of bending your head or neck to look down. Put the smartphone or pad at the level of your face while looking at the screen. Follow your treatment plan as told by your health care provider. This may include: Cognitive or behavioral therapy. Acupuncture or massage therapy. Meditation or yoga. Contact a health care provider if: You have pain that is not relieved with rest or medicine. You have increasing pain going down into your legs or buttocks. Your pain does not improve after 2 weeks. You have pain at night. You lose weight without trying. You have a fever or chills. You develop nausea or vomiting. You develop abdominal pain. Get help right away if: You develop new bowel or bladder control problems. You have unusual weakness or numbness in your arms or legs. You feel faint. These symptoms may represent a serious problem that is an emergency. Do not wait to see if the symptoms will go away. Get medical help right away. Call your local emergency services (911 in the [place] Do not drive yourself to the hospital. Summary Acute back pain is sudden and usually short-lived. Use proper lifting techniques. When you bend and lift, use positions that put less stress on your back. Take over-the-counter and prescription medicines only as told by your health care provider, and apply heat or ice as told. This information is not intended to replace advice given to you by your health care provider. Make sure you discuss any questions you have with your health care provider. Document Revised: 07/29/2020 Document Reviewed: 07/29/2020 Elsevier Patient Education  2023 Elsevier Inc.  

## 2022-03-27 NOTE — Progress Notes (Signed)
Subjective:    Patient ID: Virginia Nash, female    DOB: 06/27/1945, 76 y.o.   MRN: [id number]  Chief Complaint  Patient presents with   Hip Pain    Bilateral. Started over the weekend and getting worse.   Back Pain   Pt presents to the office today with bilateral hip and lower back pain that started over the weekend. Denies any injury.  Hip Pain  Pertinent negatives include no numbness or tingling.  Back Pain This is a new problem. The current episode started in the past 7 days. The problem occurs intermittently. The problem has been waxing and waning since onset. The pain is present in the lumbar spine. The quality of the pain is described as aching. Radiates to: bilateral hip. The pain is at a severity of 7/10. The pain is moderate. The symptoms are aggravated by twisting and bending. Pertinent negatives include no chest pain, fever, headaches, leg pain, numbness, paresis, pelvic pain, tingling, weakness or weight loss. She has tried bed rest (gabapentin) for the symptoms. The treatment provided mild relief.      Review of Systems  Constitutional:  Negative for fever and weight loss.  Cardiovascular:  Negative for chest pain.  Genitourinary:  Negative for pelvic pain.  Musculoskeletal:  Positive for back pain.  Neurological:  Negative for tingling, weakness, numbness and headaches.  All other systems reviewed and are negative.      Objective:   Physical Exam Vitals reviewed.  Constitutional:      General: She is not in acute distress.    Appearance: She is well-developed. She is obese.  HENT:     Head: Normocephalic and atraumatic.     Right Ear: Tympanic membrane normal.     Left Ear: Tympanic membrane normal.  Eyes:     Pupils: Pupils are equal, round, and reactive to light.  Neck:     Thyroid: No thyromegaly.  Cardiovascular:     Rate and Rhythm: Normal rate and regular rhythm.     Heart sounds: Normal heart sounds. No murmur heard. Pulmonary:     Effort:  Pulmonary effort is normal. No respiratory distress.     Breath sounds: Normal breath sounds. No wheezing.  Abdominal:     General: Bowel sounds are normal. There is no distension.     Palpations: Abdomen is soft.     Tenderness: There is no abdominal tenderness.  Musculoskeletal:        General: No tenderness. Normal range of motion.     Cervical back: Normal range of motion and neck supple.     Comments: Full ROM of lumbar and bilateral hips, but pain with internal rotation and bending  Skin:    General: Skin is warm and dry.  Neurological:     Mental Status: She is alert and oriented to person, place, and time.     Cranial Nerves: No cranial nerve deficit.     Deep Tendon Reflexes: Reflexes are normal and symmetric.  Psychiatric:        Behavior: Behavior normal.        Thought Content: Thought content normal.        Judgment: Judgment normal.      BP 136/70   Pulse 73   Temp (!) 96.9 F (36.1 C) (Temporal)   Ht '5\' 1"'$  (1.549 m)   Wt 199 lb 9.6 oz (90.5 kg)   BMI 37.71 kg/m       Assessment & Plan:  Virginia Eddleman  Nash comes in today with chief complaint of Hip Pain (Bilateral. Started over the weekend and getting worse.) and Back Pain   Diagnosis and orders addressed:  1. Acute bilateral low back pain without sciatica Rest Ice ROM exercises  No other NSAID"s while taking Voltaren  Follow up if symptoms worsen or do not improve  - diclofenac (VOLTAREN) 75 MG EC tablet; Take 1 tablet (75 mg total) by mouth 2 (two) times daily.  Dispense: 30 tablet; Refill: 0 - predniSONE (DELTASONE) 20 MG tablet; Take 2 tablets (40 mg total) by mouth daily with breakfast for 5 days.  Dispense: 10 tablet; Refill: 0  Evelina Dun, FNP

## 2022-04-04 ENCOUNTER — Telehealth: Payer: Self-pay | Admitting: Family Medicine

## 2022-04-04 DIAGNOSIS — M545 Low back pain, unspecified: Secondary | ICD-10-CM

## 2022-04-04 MED ORDER — TIZANIDINE HCL 4 MG PO TABS: 2.0000 mg | ORAL_TABLET | Freq: Three times a day (TID) | ORAL | 0 refills | Status: DC | PRN

## 2022-04-04 NOTE — Telephone Encounter (Signed)
Patient was seen on 11/7 for back pain and it is no better. She wants to know if there is anything else she can try or take to help. Please call back to advise.

## 2022-04-04 NOTE — Telephone Encounter (Signed)
Recommend physical therapy and xray Would she like referral next door?

## 2022-04-04 NOTE — Telephone Encounter (Signed)
Order placed and I sent her over a muscle relaxer as well. Caution sedation.  Meds ordered this encounter  Medications   tiZANidine (ZANAFLEX) 4 MG tablet    Sig: Take 0.5-1 tablets (2-4 mg total) by mouth every 8 (eight) hours as needed for muscle spasms.    Dispense:  30 tablet    Refill:  0   Orders Placed This Encounter  Procedures   Ambulatory referral to Physical Therapy

## 2022-04-04 NOTE — Telephone Encounter (Signed)
PT WANTS TO BE SEEN IN [place] [facility] [place] [place] IS WHERE SHE IS BEING SEE NOW FOR HER SHOULDER

## 2022-04-04 NOTE — Telephone Encounter (Signed)
Pt aware.

## 2022-04-04 NOTE — Telephone Encounter (Signed)
Treating provider off please advise

## 2022-04-04 NOTE — Addendum Note (Signed)
Addended by: [name] on: 04/04/2022 12:18 PM   Modules accepted: Orders

## 2022-04-06 NOTE — Telephone Encounter (Signed)
Pt is going to [facility] [place]

## 2022-05-24 DIAGNOSIS — Z23 Encounter for immunization: Secondary | ICD-10-CM | POA: Diagnosis not present

## 2022-06-21 IMAGING — DX DG CHEST 2V
2 series · 2 of 2 positions shown · non-contrast
Comparison: July 11, 2018.

CLINICAL DATA: Chest pain, dyspnea on exertion.

EXAM:
CHEST - 2 VIEW

[chest pa]
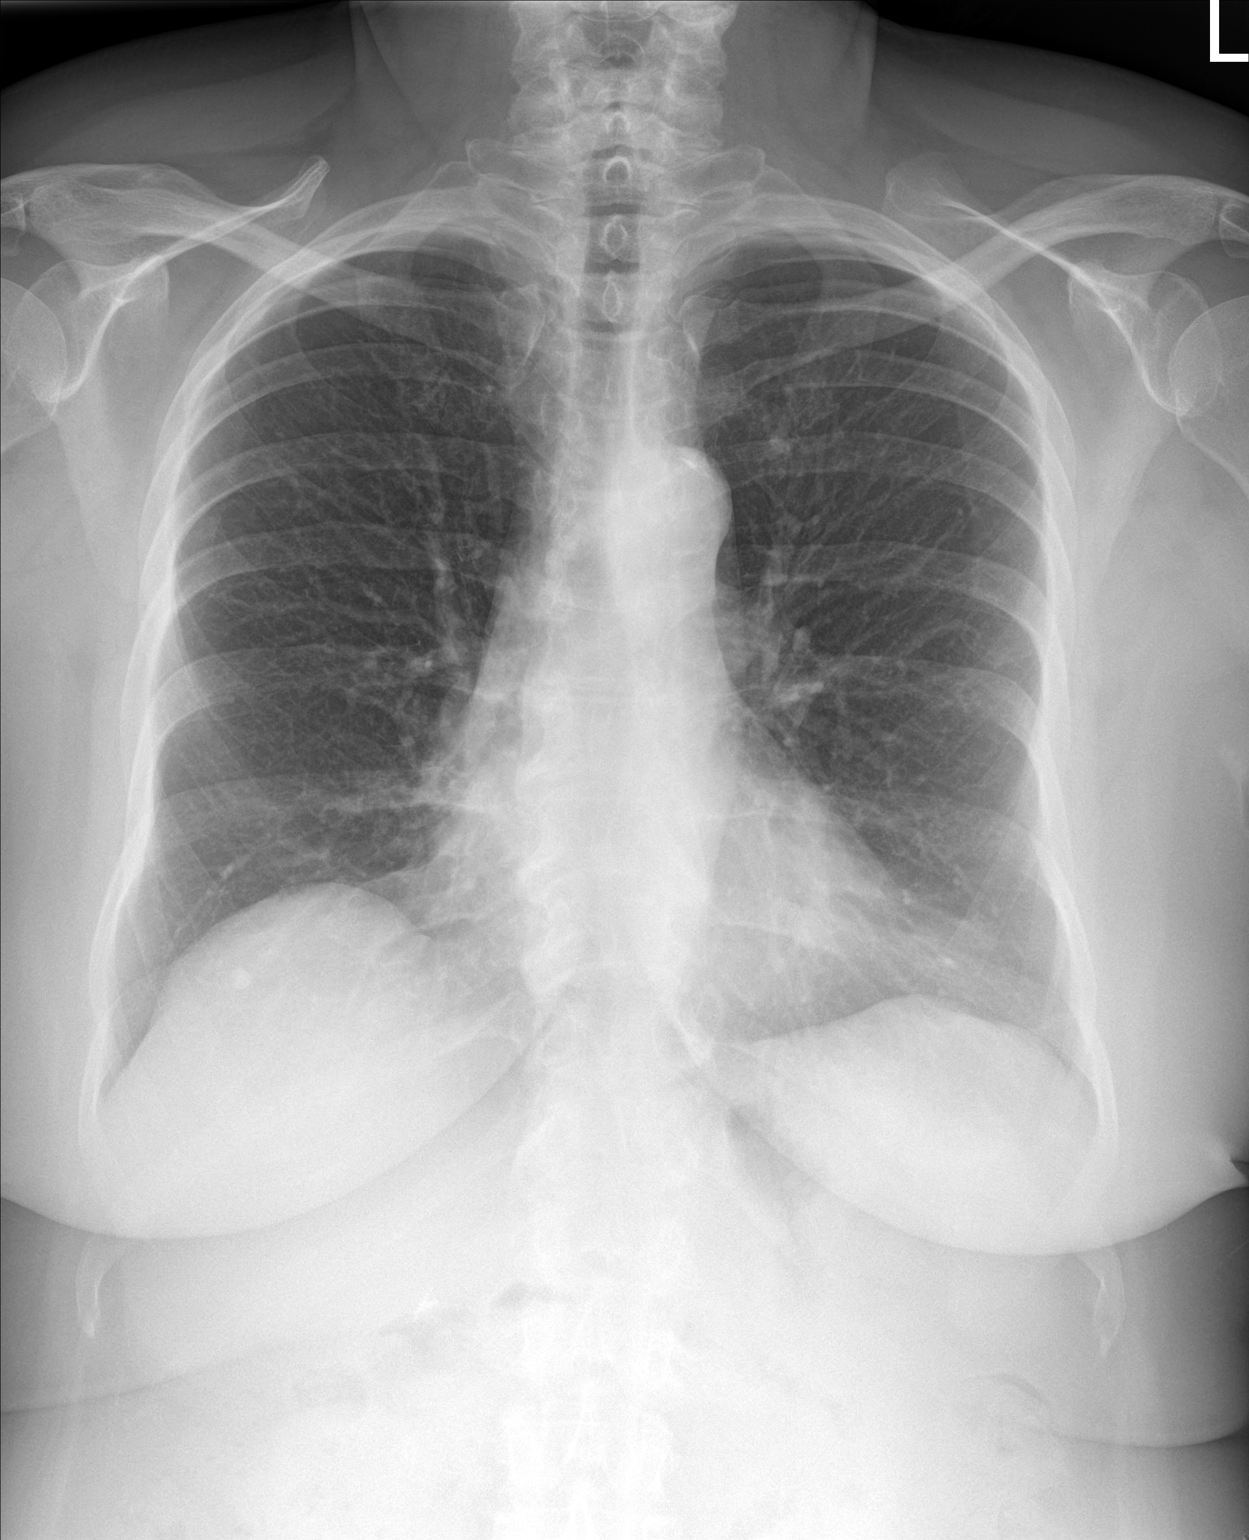

[chest lat]
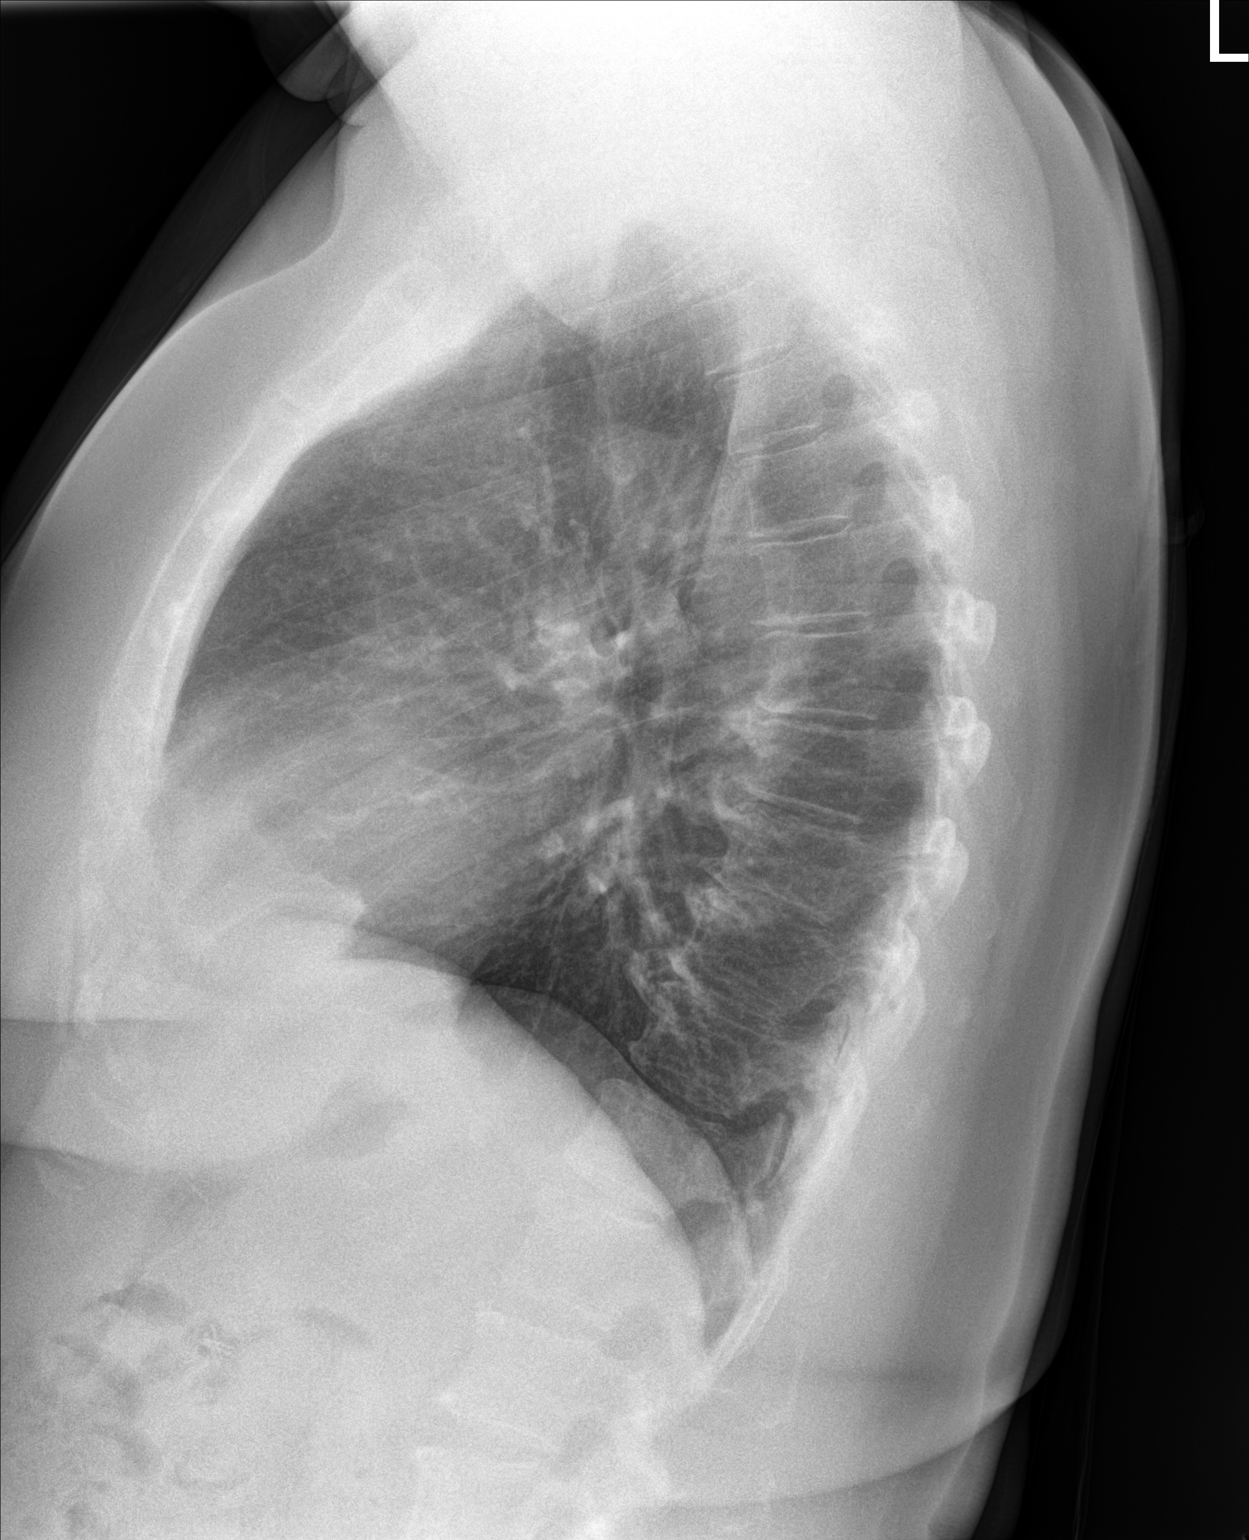

[2 of 2 positions shown; findings below may reference images not displayed]

FINDINGS: The heart size and mediastinal contours are within normal limits.
Both lungs are clear. The visualized skeletal structures are
unremarkable.
IMPRESSION: No active cardiopulmonary disease.

## 2022-06-26 DIAGNOSIS — M1711 Unilateral primary osteoarthritis, right knee: Secondary | ICD-10-CM | POA: Diagnosis not present

## 2022-07-03 DIAGNOSIS — M1711 Unilateral primary osteoarthritis, right knee: Secondary | ICD-10-CM | POA: Diagnosis not present

## 2022-07-26 ENCOUNTER — Telehealth: Payer: Self-pay | Admitting: Family Medicine

## 2022-07-26 NOTE — Telephone Encounter (Signed)
I was advised to tell patient 800 mg of Ibuprofen 3 times a day.  Contacted patient and notified patient

## 2022-08-06 ENCOUNTER — Encounter: Payer: Self-pay | Admitting: Family Medicine

## 2022-08-06 ENCOUNTER — Ambulatory Visit (INDEPENDENT_AMBULATORY_CARE_PROVIDER_SITE_OTHER): Payer: Medicare Other | Admitting: Family Medicine

## 2022-08-06 VITALS — BP 140/80 | HR 93 | Temp 98.6°F | Ht 61.0 in | Wt 196.0 lb

## 2022-08-06 DIAGNOSIS — E7800 Pure hypercholesterolemia, unspecified: Secondary | ICD-10-CM

## 2022-08-06 DIAGNOSIS — Z01818 Encounter for other preprocedural examination: Secondary | ICD-10-CM | POA: Diagnosis not present

## 2022-08-06 DIAGNOSIS — Z6837 Body mass index (BMI) 37.0-37.9, adult: Secondary | ICD-10-CM

## 2022-08-06 NOTE — Progress Notes (Signed)
Pt is a 77 y.o. female who is here for preoperative clearance for right partial knee with Dr Zachery Dakins at [facility].  She denies CP.  Occasionally has SOB with going up stairs/ exertion. No coughing. No problems extending neck.  Has never had postop complications including excessive nausea vomiting, difficulty arousing or postop infections  1) High Risk Cardiac Conditions  1) Recent MI - No.  2) Decompensated Heart Failure - No.  3) Unstable angina - No.  4) Symptomatic arrythmia - No.  5) Sx Valvular Disease - No.  2) Intermediate Risk Factors - DM, CKD, CVA, CHF, CAD - No.  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  Marland Kitchen    3) Surgery Specific Risk: Intermediate           4) Further Noninvasive evaluation -   1) EKG - Yes.      2) Echo - No.   1) Worsening dyspnea   3) Stress Testing - Active Cardiac Disease - No.  5) Need for medical therapy - Beta Blocker, Statins indicated ? No.  PE: Vitals:   08/06/22 1609  BP: (!) 140/80  Pulse: 93  Temp: 98.6 F (37 C)  Height: 5\' 1"  (1.549 m)  Weight: 196 lb (88.9 kg)  SpO2: 93%  BMI (Calculated): 37.05   Physical Examination: Mental status - alert, oriented to person, place, and time Eyes - pupils equal and reactive, extraocular eye movements intact Ears - bilateral TM's and external ear canals normal Mouth - mucous membranes moist, pharynx normal without lesions and Mallampati 1 Neck - supple, no significant adenopathy, has Full AROM and able to extend without difficulty Chest - clear to auscultation, no wheezes, rales or rhonchi, symmetric air entry Heart - normal rate, regular rhythm, normal S1, S2, no murmurs, rubs, clicks or gallops Extremities - peripheral pulses normal, no pedal edema, no clubbing or cyanosis  Preop examination - Plan: EKG 12-Lead, CMP14+EGFR, CoaguChek XS/INR Waived, CBC, Bayer DCA Hb A1c Waived, Urinalysis  Morbid obesity (HCC) - Plan: EKG 12-Lead, Bayer DCA Hb A1c Waived, Urinalysis  Pure  hypercholesterolemia - Plan: EKG 12-Lead, CMP14+EGFR  I have independently evaluated patient.  AYELA KAY is a 77 y.o. female who is LOW risk for an INTERMEDIATE risk surgery.  There are modifiable risk factors (weight loss. BMI currently >35).  Whitnee C Tunison's RCRI calculation for MACE is: 0.  I personally reviewed EKG and there were no changes or abnormalities of concern today.  Her daughter will bring in the preop clearance form and I will fax over results and notes to her specialist.  Not sure if she will have her own preop clearance with anesthesia but if so we can defer laboratory draw to them  Sherhonda Gaspar M. Lajuana Ripple, [facility]

## 2022-08-07 ENCOUNTER — Telehealth: Payer: Self-pay | Admitting: Family Medicine

## 2022-08-07 NOTE — Telephone Encounter (Signed)
Please have the patient call [facility] and see if they can fax [place] one.  We do not have a form here.

## 2022-08-21 DIAGNOSIS — Z01812 Encounter for preprocedural laboratory examination: Secondary | ICD-10-CM | POA: Diagnosis not present

## 2022-08-21 DIAGNOSIS — M1711 Unilateral primary osteoarthritis, right knee: Secondary | ICD-10-CM | POA: Diagnosis not present

## 2022-08-21 DIAGNOSIS — M25561 Pain in right knee: Secondary | ICD-10-CM | POA: Diagnosis not present

## 2022-08-28 DIAGNOSIS — Z961 Presence of intraocular lens: Secondary | ICD-10-CM | POA: Diagnosis not present

## 2022-08-28 DIAGNOSIS — H33321 Round hole, right eye: Secondary | ICD-10-CM | POA: Diagnosis not present

## 2022-08-28 DIAGNOSIS — H04123 Dry eye syndrome of bilateral lacrimal glands: Secondary | ICD-10-CM | POA: Diagnosis not present

## 2022-08-28 DIAGNOSIS — H40013 Open angle with borderline findings, low risk, bilateral: Secondary | ICD-10-CM | POA: Diagnosis not present

## 2022-09-14 DIAGNOSIS — G8918 Other acute postprocedural pain: Secondary | ICD-10-CM | POA: Diagnosis not present

## 2022-09-14 DIAGNOSIS — M1711 Unilateral primary osteoarthritis, right knee: Secondary | ICD-10-CM | POA: Diagnosis not present

## 2022-09-17 DIAGNOSIS — M25561 Pain in right knee: Secondary | ICD-10-CM | POA: Diagnosis not present

## 2022-09-20 DIAGNOSIS — M25561 Pain in right knee: Secondary | ICD-10-CM | POA: Diagnosis not present

## 2022-09-26 DIAGNOSIS — M25561 Pain in right knee: Secondary | ICD-10-CM | POA: Diagnosis not present

## 2022-09-27 DIAGNOSIS — M1711 Unilateral primary osteoarthritis, right knee: Secondary | ICD-10-CM | POA: Diagnosis not present

## 2022-10-01 DIAGNOSIS — M25561 Pain in right knee: Secondary | ICD-10-CM | POA: Diagnosis not present

## 2022-10-03 DIAGNOSIS — M25561 Pain in right knee: Secondary | ICD-10-CM | POA: Diagnosis not present

## 2022-10-05 DIAGNOSIS — M25561 Pain in right knee: Secondary | ICD-10-CM | POA: Diagnosis not present

## 2022-10-08 DIAGNOSIS — M25561 Pain in right knee: Secondary | ICD-10-CM | POA: Diagnosis not present

## 2022-10-10 DIAGNOSIS — M25561 Pain in right knee: Secondary | ICD-10-CM | POA: Diagnosis not present

## 2022-10-11 DIAGNOSIS — M1711 Unilateral primary osteoarthritis, right knee: Secondary | ICD-10-CM | POA: Diagnosis not present

## 2022-10-12 DIAGNOSIS — M25561 Pain in right knee: Secondary | ICD-10-CM | POA: Diagnosis not present

## 2022-10-16 DIAGNOSIS — M25561 Pain in right knee: Secondary | ICD-10-CM | POA: Diagnosis not present

## 2022-10-17 DIAGNOSIS — M25561 Pain in right knee: Secondary | ICD-10-CM | POA: Diagnosis not present

## 2022-11-08 DIAGNOSIS — M1711 Unilateral primary osteoarthritis, right knee: Secondary | ICD-10-CM | POA: Diagnosis not present

## 2022-11-12 ENCOUNTER — Other Ambulatory Visit (HOSPITAL_COMMUNITY): Payer: Self-pay | Admitting: Family Medicine

## 2022-11-12 DIAGNOSIS — Z1231 Encounter for screening mammogram for malignant neoplasm of breast: Secondary | ICD-10-CM

## 2023-01-07 ENCOUNTER — Other Ambulatory Visit: Payer: Self-pay | Admitting: Family Medicine

## 2023-02-11 ENCOUNTER — Encounter (HOSPITAL_COMMUNITY): Payer: Self-pay

## 2023-02-11 ENCOUNTER — Ambulatory Visit (HOSPITAL_COMMUNITY)
Admission: RE | Admit: 2023-02-11 | Discharge: 2023-02-11 | Disposition: A | Discharge status: Home / Self Care | Payer: Medicare Other | Source: Ambulatory Visit | Attending: Family Medicine | Admitting: Family Medicine

## 2023-02-11 DIAGNOSIS — Z1231 Encounter for screening mammogram for malignant neoplasm of breast: Secondary | ICD-10-CM | POA: Insufficient documentation

## 2023-02-11 DIAGNOSIS — Z23 Encounter for immunization: Secondary | ICD-10-CM | POA: Diagnosis not present

## 2023-02-14 ENCOUNTER — Emergency Department (HOSPITAL_COMMUNITY)
Admission: EM | Admit: 2023-02-14 | Discharge: 2023-02-15 | Disposition: A | Discharge status: Home / Self Care | Payer: Medicare Other | Attending: Emergency Medicine | Admitting: Emergency Medicine

## 2023-02-14 ENCOUNTER — Other Ambulatory Visit: Payer: Self-pay

## 2023-02-14 ENCOUNTER — Encounter (HOSPITAL_COMMUNITY): Payer: Self-pay | Admitting: Emergency Medicine

## 2023-02-14 ENCOUNTER — Emergency Department (HOSPITAL_COMMUNITY): Payer: Medicare Other

## 2023-02-14 DIAGNOSIS — W010XXA Fall on same level from slipping, tripping and stumbling without subsequent striking against object, initial encounter: Secondary | ICD-10-CM | POA: Insufficient documentation

## 2023-02-14 DIAGNOSIS — S62307A Unspecified fracture of fifth metacarpal bone, left hand, initial encounter for closed fracture: Secondary | ICD-10-CM | POA: Diagnosis not present

## 2023-02-14 DIAGNOSIS — J45909 Unspecified asthma, uncomplicated: Secondary | ICD-10-CM | POA: Diagnosis not present

## 2023-02-14 DIAGNOSIS — M19032 Primary osteoarthritis, left wrist: Secondary | ICD-10-CM | POA: Diagnosis not present

## 2023-02-14 DIAGNOSIS — Z7982 Long term (current) use of aspirin: Secondary | ICD-10-CM | POA: Insufficient documentation

## 2023-02-14 DIAGNOSIS — R0989 Other specified symptoms and signs involving the circulatory and respiratory systems: Secondary | ICD-10-CM | POA: Diagnosis not present

## 2023-02-14 DIAGNOSIS — R079 Chest pain, unspecified: Secondary | ICD-10-CM | POA: Insufficient documentation

## 2023-02-14 DIAGNOSIS — S6992XA Unspecified injury of left wrist, hand and finger(s), initial encounter: Secondary | ICD-10-CM | POA: Diagnosis present

## 2023-02-14 DIAGNOSIS — Z91010 Allergy to peanuts: Secondary | ICD-10-CM | POA: Insufficient documentation

## 2023-02-14 DIAGNOSIS — R0789 Other chest pain: Secondary | ICD-10-CM | POA: Diagnosis not present

## 2023-02-14 DIAGNOSIS — S62317A Displaced fracture of base of fifth metacarpal bone. left hand, initial encounter for closed fracture: Secondary | ICD-10-CM | POA: Insufficient documentation

## 2023-02-14 DIAGNOSIS — R140 Abdominal distension (gaseous): Secondary | ICD-10-CM | POA: Diagnosis not present

## 2023-02-14 NOTE — ED Triage Notes (Signed)
Pt reports tripping and falling, breaking her fall with left hand, swelling and bruising noted with obvious deformity, denies LOC, hitting head, and thinners

## 2023-02-15 ENCOUNTER — Other Ambulatory Visit: Payer: Self-pay

## 2023-02-15 ENCOUNTER — Emergency Department (HOSPITAL_COMMUNITY)
Admission: EM | Admit: 2023-02-15 | Discharge: 2023-02-15 | Disposition: A | Discharge status: Home / Self Care | Payer: Medicare Other | Attending: Emergency Medicine | Admitting: Emergency Medicine

## 2023-02-15 ENCOUNTER — Emergency Department (HOSPITAL_COMMUNITY): Payer: Medicare Other

## 2023-02-15 ENCOUNTER — Encounter (HOSPITAL_COMMUNITY): Payer: Self-pay

## 2023-02-15 DIAGNOSIS — R0789 Other chest pain: Secondary | ICD-10-CM

## 2023-02-15 DIAGNOSIS — J45909 Unspecified asthma, uncomplicated: Secondary | ICD-10-CM | POA: Insufficient documentation

## 2023-02-15 DIAGNOSIS — S62317A Displaced fracture of base of fifth metacarpal bone. left hand, initial encounter for closed fracture: Secondary | ICD-10-CM | POA: Diagnosis not present

## 2023-02-15 DIAGNOSIS — Z7982 Long term (current) use of aspirin: Secondary | ICD-10-CM | POA: Insufficient documentation

## 2023-02-15 DIAGNOSIS — R079 Chest pain, unspecified: Secondary | ICD-10-CM | POA: Insufficient documentation

## 2023-02-15 DIAGNOSIS — R0989 Other specified symptoms and signs involving the circulatory and respiratory systems: Secondary | ICD-10-CM | POA: Diagnosis not present

## 2023-02-15 DIAGNOSIS — Z91010 Allergy to peanuts: Secondary | ICD-10-CM | POA: Insufficient documentation

## 2023-02-15 DIAGNOSIS — R140 Abdominal distension (gaseous): Secondary | ICD-10-CM | POA: Diagnosis not present

## 2023-02-15 LAB — CBC WITH DIFFERENTIAL/PLATELET
Abs Immature Granulocytes: 0.03 10*3/uL (ref 0.00–0.07)
Basophils Absolute: 0.1 10*3/uL (ref 0.0–0.1)
Basophils Relative: 1 %
Eosinophils Absolute: 0.2 10*3/uL (ref 0.0–0.5)
Eosinophils Relative: 2 %
HCT: 40.8 % (ref 36.0–46.0)
Hemoglobin: 12.8 g/dL (ref 12.0–15.0)
Immature Granulocytes: 0 %
Lymphocytes Relative: 22 %
Lymphs Abs: 2 10*3/uL (ref 0.7–4.0)
MCH: 29.8 pg (ref 26.0–34.0)
MCHC: 31.4 g/dL (ref 30.0–36.0)
MCV: 95.1 fL (ref 80.0–100.0)
Monocytes Absolute: 0.6 10*3/uL (ref 0.1–1.0)
Monocytes Relative: 7 %
Neutro Abs: 6.3 10*3/uL (ref 1.7–7.7)
Neutrophils Relative %: 68 %
Platelets: 185 10*3/uL (ref 150–400)
RBC: 4.29 MIL/uL (ref 3.87–5.11)
RDW: 13.6 % (ref 11.5–15.5)
WBC: 9.1 10*3/uL (ref 4.0–10.5)
nRBC: 0 % (ref 0.0–0.2)

## 2023-02-15 LAB — COMPREHENSIVE METABOLIC PANEL
ALT: 20 U/L (ref 0–44)
AST: 25 U/L (ref 15–41)
Albumin: 3.8 g/dL (ref 3.5–5.0)
Alkaline Phosphatase: 119 U/L (ref 38–126)
Anion gap: 8 (ref 5–15)
BUN: 25 mg/dL — ABNORMAL HIGH (ref 8–23)
CO2: 29 mmol/L (ref 22–32)
Calcium: 8.8 mg/dL — ABNORMAL LOW (ref 8.9–10.3)
Chloride: 101 mmol/L (ref 98–111)
Creatinine, Ser: 0.9 mg/dL (ref 0.44–1.00)
GFR, Estimated: >60 mL/min (ref >60)
Glucose, Bld: 135 mg/dL — ABNORMAL HIGH (ref 70–99)
Potassium: 4.2 mmol/L (ref 3.5–5.1)
Sodium: 138 mmol/L (ref 135–145)
Total Bilirubin: 0.8 mg/dL (ref 0.3–1.2)
Total Protein: 7.1 g/dL (ref 6.5–8.1)

## 2023-02-15 LAB — LIPASE, BLOOD: Lipase: 27 U/L (ref 11–51)

## 2023-02-15 LAB — TROPONIN I (HIGH SENSITIVITY): Troponin I (High Sensitivity): 2 ng/L (ref <18)

## 2023-02-15 MED ORDER — LIDOCAINE VISCOUS HCL 2 % MT SOLN
15.0000 mL | Freq: Once | OROMUCOSAL | Status: AC
  Administered 2023-02-15: 15 mL via ORAL
  Filled 2023-02-15: qty 15

## 2023-02-15 MED ORDER — ONDANSETRON 8 MG PO TBDP: ORAL_TABLET | ORAL | 0 refills | Status: DC

## 2023-02-15 MED ORDER — HYDROCODONE-ACETAMINOPHEN 5-325 MG PO TABS: 1.0000 | ORAL_TABLET | Freq: Four times a day (QID) | ORAL | 0 refills | Status: DC | PRN

## 2023-02-15 MED ORDER — ALUM & MAG HYDROXIDE-SIMETH 200-200-20 MG/5ML PO SUSP
30.0000 mL | Freq: Once | ORAL | Status: AC
  Administered 2023-02-15: 30 mL via ORAL
  Filled 2023-02-15: qty 30

## 2023-02-15 MED ORDER — HYDROCODONE-ACETAMINOPHEN 5-325 MG PO TABS
2.0000 | ORAL_TABLET | Freq: Once | ORAL | Status: AC
  Administered 2023-02-15: 2 via ORAL
  Filled 2023-02-15: qty 2

## 2023-02-15 MED ORDER — ONDANSETRON 8 MG PO TBDP
8.0000 mg | ORAL_TABLET | Freq: Once | ORAL | Status: AC
  Administered 2023-02-15: 8 mg via ORAL
  Filled 2023-02-15: qty 1

## 2023-02-15 NOTE — ED Triage Notes (Signed)
Pt bib daughter after being discharged from here for fx to left pinky finger, splint applied here, pt was given to percocets and on way home pt started having chest pain and sob, so she came back for evaluation.

## 2023-02-15 NOTE — ED Provider Notes (Addendum)
[place] EMERGENCY DEPARTMENT AT [facility] Provider Note   CSN: [id number] Arrival date & time: 02/14/23  2143     History  Chief Complaint  Patient presents with   Hand Injury    Virginia Nash is a 77 y.o. female.  Patient is a 77 year old female with history of asthma, hyperlipidemia.  Patient presenting with a left hand injury.  She tripped and fell this evening and landed awkwardly on her hand.  She has pain, swelling, and deformity of the left fifth finger/hand.  She denies other injury or trauma.  The history is provided by the patient.       Home Medications Prior to Admission medications   Medication Sig Start Date End Date Taking? Authorizing Provider  albuterol (VENTOLIN HFA) 108 (90 Base) MCG/ACT inhaler Inhale 2 puffs into the lungs every 4 (four) hours as needed for wheezing or shortness of breath. 01/01/22   Raliegh Ip, DO  aspirin EC 81 MG tablet Take 81 mg by mouth daily. Swallow whole.    [provider]  atorvastatin (LIPITOR) 20 MG tablet Take 1 tablet (20 mg total) by mouth daily. 01/01/22   Raliegh Ip, DO  Calcium Carbonate-Vit D-Min (CALCIUM 1200 PO) Take by mouth.    [provider]  Cholecalciferol (VITAMIN D3) 25 MCG (1000 UT) CAPS Take by mouth.    [provider]  diclofenac (VOLTAREN) 75 MG EC tablet Take 1 tablet (75 mg total) by mouth 2 (two) times daily. Patient not taking: Reported on 08/06/2022 03/27/22   Junie Spencer, FNP  loratadine (CLARITIN) 10 MG tablet Take 10 mg by mouth daily.    [provider]  omeprazole (PRILOSEC) 20 MG capsule Take 1 capsule by mouth once daily 01/07/23   Delynn Flavin M, DO  ondansetron (ZOFRAN) 4 MG tablet Take 1 tablet (4 mg total) by mouth every 8 (eight) hours as needed for nausea or vomiting. 06/08/20   Dolores Frame, MD      Allergies    Asparagus, Codeine, Other, and Peanut oil    Review of Systems   Review of Systems   All other systems reviewed and are negative.   Physical Exam Updated Vital Signs BP (!) 153/68 (BP Location: Right Arm)   Pulse 75   Temp 98.4 F (36.9 C) (Oral)   Resp 16   Ht 5\' 1"  (1.549 m)   Wt 90.7 kg   SpO2 99%   BMI 37.79 kg/m  Physical Exam Vitals and nursing note reviewed.  Constitutional:      Appearance: Normal appearance.  Pulmonary:     Effort: Pulmonary effort is normal.  Musculoskeletal:     Comments: There is swelling and ecchymosis overlying the dorsal aspect of the hand overlying the third and fifth metatarsals.  The fifth finger is deviated laterally.  Motor and sensation are intact.  Skin:    General: Skin is warm and dry.  Neurological:     Mental Status: She is alert.     ED Results / Procedures / Treatments   Labs (all labs ordered are listed, but only abnormal results are displayed) Labs Reviewed - No data to display  EKG None  Radiology DG Hand Complete Left  Result Date: 02/14/2023 CLINICAL DATA:  Status post fall. EXAM: LEFT HAND - COMPLETE 3+ VIEW COMPARISON:  None Available. FINDINGS: An acute, comminuted fracture deformity is seen involving the mid to distal aspect of the fifth left metacarpal. There is no evidence  of dislocation. Marked severity degenerative changes are seen along the lateral aspect of the left wrist and involving the first carpometacarpal joint. Soft tissue swelling is seen adjacent to the previously noted fracture site. IMPRESSION: Acute fracture of the fifth left metacarpal. Electronically Signed   By: Aram Candela M.D.   On: 02/14/2023 23:46    Procedures Procedures    Medications Ordered in ED Medications  HYDROcodone-acetaminophen (NORCO/VICODIN) 5-325 MG per tablet 2 tablet (2 tablets Oral Given 02/15/23 0205)    ED Course/ Medical Decision Making/ A&P  X-rays show a displaced and angulated fifth metacarpal fracture.  Patient will be placed in an ulnar gutter splint and advised to follow-up with hand  surgery.  Patient and family requesting to see Dr. Janee Morn so referral will be made to him.  Patient given Vicodin here in the ER and will be discharged with the same.  Final Clinical Impression(s) / ED Diagnoses Final diagnoses:  None    Rx / DC Orders ED Discharge Orders     None         Geoffery Lyons, MD 02/15/23 1610    Geoffery Lyons, MD 02/15/23 0210

## 2023-02-15 NOTE — Discharge Instructions (Addendum)
Wear splint until followed up by orthopedics.  Call Dr. Carollee Massed office in the morning to arrange a follow-up appointment.  Ideally this should be today or Monday.  Ice for 20 minutes every 2 hours while awake for the next 2 days.  Elevate your hand is much as possible for the next 2 days.  Take hydrocodone as prescribed as needed for pain.

## 2023-02-15 NOTE — ED Provider Notes (Signed)
[place] EMERGENCY DEPARTMENT AT [facility] Provider Note   CSN: [id number] Arrival date & time: 02/15/23  0421     History  Chief Complaint  Patient presents with   Chest Pain   Shortness of Breath    Virginia Nash is a 77 y.o. female.  Patient is a 77 year old female with history of asthma, GERD, prior cholecystectomy.  Patient presenting today for evaluation of chest pain.  She was just seen here for 1/5 metacarpal fracture which was splinted.  She was given Zofran and hydrocodone, then discharged to home.  While driving to her home, she developed tightness in her chest and difficulty breathing, then was brought back for further evaluation.  She denies any nausea, diaphoresis, or radiation to the arm or jaw.  She denies any prior cardiac history with the exception of having had a stress test many years ago which was unremarkable.  Patient does state that she has an intolerance to pain medication.  The history is provided by the patient.       Home Medications Prior to Admission medications   Medication Sig Start Date End Date Taking? Authorizing Provider  albuterol (VENTOLIN HFA) 108 (90 Base) MCG/ACT inhaler Inhale 2 puffs into the lungs every 4 (four) hours as needed for wheezing or shortness of breath. 01/01/22   Raliegh Ip, DO  aspirin EC 81 MG tablet Take 81 mg by mouth daily. Swallow whole.    [provider]  atorvastatin (LIPITOR) 20 MG tablet Take 1 tablet (20 mg total) by mouth daily. 01/01/22   Raliegh Ip, DO  Calcium Carbonate-Vit D-Min (CALCIUM 1200 PO) Take by mouth.    [provider]  Cholecalciferol (VITAMIN D3) 25 MCG (1000 UT) CAPS Take by mouth.    [provider]  diclofenac (VOLTAREN) 75 MG EC tablet Take 1 tablet (75 mg total) by mouth 2 (two) times daily. Patient not taking: Reported on 08/06/2022 03/27/22   Junie Spencer, FNP  HYDROcodone-acetaminophen (NORCO) 5-325 MG tablet Take 1-2 tablets by  mouth every 6 (six) hours as needed. 02/15/23   Geoffery Lyons, MD  loratadine (CLARITIN) 10 MG tablet Take 10 mg by mouth daily.    [provider]  omeprazole (PRILOSEC) 20 MG capsule Take 1 capsule by mouth once daily 01/07/23   Delynn Flavin M, DO  ondansetron (ZOFRAN) 4 MG tablet Take 1 tablet (4 mg total) by mouth every 8 (eight) hours as needed for nausea or vomiting. 06/08/20   Dolores Frame, MD  ondansetron (ZOFRAN-ODT) 8 MG disintegrating tablet 8mg  ODT q6 hours prn nausea 02/15/23   Geoffery Lyons, MD      Allergies    Asparagus, Codeine, Other, and Peanut oil    Review of Systems   Review of Systems  All other systems reviewed and are negative.   Physical Exam Updated Vital Signs BP 126/71   Pulse 74   Temp 98.4 F (36.9 C) (Oral)   Resp 17   Ht 5\' 1"  (1.549 m)   Wt 90.7 kg   SpO2 93%   BMI 37.78 kg/m  Physical Exam Vitals and nursing note reviewed.  Constitutional:      General: She is not in acute distress.    Appearance: She is well-developed. She is not diaphoretic.  HENT:     Head: Normocephalic and atraumatic.  Cardiovascular:     Rate and Rhythm: Normal rate and regular rhythm.     Heart sounds: No murmur heard.  No friction rub. No gallop.  Pulmonary:     Effort: Pulmonary effort is normal. No respiratory distress.     Breath sounds: Normal breath sounds. No wheezing.  Abdominal:     General: Bowel sounds are normal. There is no distension.     Palpations: Abdomen is soft.     Tenderness: There is no abdominal tenderness.  Musculoskeletal:        General: Normal range of motion.     Cervical back: Normal range of motion and neck supple.  Skin:    General: Skin is warm and dry.  Neurological:     General: No focal deficit present.     Mental Status: She is alert and oriented to person, place, and time.     ED Results / Procedures / Treatments   Labs (all labs ordered are listed, but only abnormal results are  displayed) Labs Reviewed  COMPREHENSIVE METABOLIC PANEL  CBC WITH DIFFERENTIAL/PLATELET  LIPASE, BLOOD  TROPONIN I (HIGH SENSITIVITY)    EKG ED ECG REPORT   Date: 02/15/2023  Rate: 75  Rhythm: normal sinus rhythm  QRS Axis: normal  Intervals: normal  ST/T Wave abnormalities: normal  Conduction Disutrbances:none  Narrative Interpretation:   Old EKG Reviewed: unchanged  I have personally reviewed the EKG tracing and agree with the computerized printout as noted.   Radiology DG Hand Complete Left  Result Date: 02/14/2023 CLINICAL DATA:  Status post fall. EXAM: LEFT HAND - COMPLETE 3+ VIEW COMPARISON:  None Available. FINDINGS: An acute, comminuted fracture deformity is seen involving the mid to distal aspect of the fifth left metacarpal. There is no evidence of dislocation. Marked severity degenerative changes are seen along the lateral aspect of the left wrist and involving the first carpometacarpal joint. Soft tissue swelling is seen adjacent to the previously noted fracture site. IMPRESSION: Acute fracture of the fifth left metacarpal. Electronically Signed   By: Aram Candela M.D.   On: 02/14/2023 23:46    Procedures Procedures    Medications Ordered in ED Medications  alum & mag hydroxide-simeth (MAALOX/MYLANTA) 200-200-20 MG/5ML suspension 30 mL (has no administration in time range)    And  lidocaine (XYLOCAINE) 2 % viscous mouth solution 15 mL (has no administration in time range)    ED Course/ Medical Decision Making/ A&P  Patient is a 77 year old female presenting with chest pain.  Patient just left the ER approximately 2 hours prior to returning after she was originally seen for a hand fracture.  Patient was given pain medication, then on the way home developed chest discomfort and epigastric pain.  Patient arrives here with stable vital signs and is afebrile.  Physical examination basically unremarkable.  Workup initiated including CBC, CMP, lipase, and  troponin.  All studies are unremarkable including negative troponin.  Chest x-ray obtained showing no acute process.  Patient was given a GI cocktail consisting of Maalox and viscous lidocaine.  This provided nearly instant relief of her symptoms.  I highly suspect the etiology of her symptoms is GI as she has had difficulty tolerating pain medication in the past.  I suspect the pain medication she was given during her visit for the hand fracture caused her GI disturbance and this was the cause.  Nothing shows a cardiac etiology and I feel as though patient can safely be discharged.  She is to return as needed if symptoms worsen or change.  Final Clinical Impression(s) / ED Diagnoses Final diagnoses:  None    Rx / DC  Orders ED Discharge Orders     None         Geoffery Lyons, MD 02/15/23 [phone]

## 2023-02-15 NOTE — Discharge Instructions (Signed)
Take ibuprofen 600 mg every 6 hours as needed for pain.  If you require the stronger pain medication, be sure to take this with food.  Return to the emergency department if you develop severe chest pain, difficulty breathing, high fevers, or for other new and concerning symptoms.

## 2023-02-19 DIAGNOSIS — S62327A Displaced fracture of shaft of fifth metacarpal bone, left hand, initial encounter for closed fracture: Secondary | ICD-10-CM | POA: Diagnosis not present

## 2023-02-20 DIAGNOSIS — G8918 Other acute postprocedural pain: Secondary | ICD-10-CM | POA: Diagnosis not present

## 2023-02-20 DIAGNOSIS — Y999 Unspecified external cause status: Secondary | ICD-10-CM | POA: Diagnosis not present

## 2023-02-20 DIAGNOSIS — S62327A Displaced fracture of shaft of fifth metacarpal bone, left hand, initial encounter for closed fracture: Secondary | ICD-10-CM | POA: Diagnosis not present

## 2023-02-20 DIAGNOSIS — X58XXXA Exposure to other specified factors, initial encounter: Secondary | ICD-10-CM | POA: Diagnosis not present

## 2023-02-26 DIAGNOSIS — S62327A Displaced fracture of shaft of fifth metacarpal bone, left hand, initial encounter for closed fracture: Secondary | ICD-10-CM | POA: Diagnosis not present

## 2023-02-27 DIAGNOSIS — M25442 Effusion, left hand: Secondary | ICD-10-CM | POA: Diagnosis not present

## 2023-02-27 DIAGNOSIS — M25532 Pain in left wrist: Secondary | ICD-10-CM | POA: Diagnosis not present

## 2023-02-27 DIAGNOSIS — Z23 Encounter for immunization: Secondary | ICD-10-CM | POA: Diagnosis not present

## 2023-02-27 DIAGNOSIS — Z4789 Encounter for other orthopedic aftercare: Secondary | ICD-10-CM | POA: Diagnosis not present

## 2023-02-27 DIAGNOSIS — M25642 Stiffness of left hand, not elsewhere classified: Secondary | ICD-10-CM | POA: Diagnosis not present

## 2023-02-27 DIAGNOSIS — M25432 Effusion, left wrist: Secondary | ICD-10-CM | POA: Diagnosis not present

## 2023-02-27 DIAGNOSIS — M25632 Stiffness of left wrist, not elsewhere classified: Secondary | ICD-10-CM | POA: Diagnosis not present

## 2023-02-27 DIAGNOSIS — M79642 Pain in left hand: Secondary | ICD-10-CM | POA: Diagnosis not present

## 2023-02-27 DIAGNOSIS — R202 Paresthesia of skin: Secondary | ICD-10-CM | POA: Diagnosis not present

## 2023-03-04 DIAGNOSIS — M25532 Pain in left wrist: Secondary | ICD-10-CM | POA: Diagnosis not present

## 2023-03-04 DIAGNOSIS — M25442 Effusion, left hand: Secondary | ICD-10-CM | POA: Diagnosis not present

## 2023-03-04 DIAGNOSIS — R202 Paresthesia of skin: Secondary | ICD-10-CM | POA: Diagnosis not present

## 2023-03-04 DIAGNOSIS — M25642 Stiffness of left hand, not elsewhere classified: Secondary | ICD-10-CM | POA: Diagnosis not present

## 2023-03-04 DIAGNOSIS — Z4789 Encounter for other orthopedic aftercare: Secondary | ICD-10-CM | POA: Diagnosis not present

## 2023-03-04 DIAGNOSIS — M25432 Effusion, left wrist: Secondary | ICD-10-CM | POA: Diagnosis not present

## 2023-03-04 DIAGNOSIS — M79642 Pain in left hand: Secondary | ICD-10-CM | POA: Diagnosis not present

## 2023-03-04 DIAGNOSIS — M25632 Stiffness of left wrist, not elsewhere classified: Secondary | ICD-10-CM | POA: Diagnosis not present

## 2023-03-06 ENCOUNTER — Encounter: Payer: Self-pay | Admitting: *Deleted

## 2023-03-06 DIAGNOSIS — M79642 Pain in left hand: Secondary | ICD-10-CM | POA: Diagnosis not present

## 2023-03-06 DIAGNOSIS — R202 Paresthesia of skin: Secondary | ICD-10-CM | POA: Diagnosis not present

## 2023-03-06 DIAGNOSIS — M25442 Effusion, left hand: Secondary | ICD-10-CM | POA: Diagnosis not present

## 2023-03-06 DIAGNOSIS — M25432 Effusion, left wrist: Secondary | ICD-10-CM | POA: Diagnosis not present

## 2023-03-06 DIAGNOSIS — M25632 Stiffness of left wrist, not elsewhere classified: Secondary | ICD-10-CM | POA: Diagnosis not present

## 2023-03-06 DIAGNOSIS — Z4789 Encounter for other orthopedic aftercare: Secondary | ICD-10-CM | POA: Diagnosis not present

## 2023-03-06 DIAGNOSIS — M25532 Pain in left wrist: Secondary | ICD-10-CM | POA: Diagnosis not present

## 2023-03-06 DIAGNOSIS — M25642 Stiffness of left hand, not elsewhere classified: Secondary | ICD-10-CM | POA: Diagnosis not present

## 2023-03-11 DIAGNOSIS — M25432 Effusion, left wrist: Secondary | ICD-10-CM | POA: Diagnosis not present

## 2023-03-11 DIAGNOSIS — M25632 Stiffness of left wrist, not elsewhere classified: Secondary | ICD-10-CM | POA: Diagnosis not present

## 2023-03-11 DIAGNOSIS — M25642 Stiffness of left hand, not elsewhere classified: Secondary | ICD-10-CM | POA: Diagnosis not present

## 2023-03-11 DIAGNOSIS — M25532 Pain in left wrist: Secondary | ICD-10-CM | POA: Diagnosis not present

## 2023-03-11 DIAGNOSIS — R202 Paresthesia of skin: Secondary | ICD-10-CM | POA: Diagnosis not present

## 2023-03-11 DIAGNOSIS — M79642 Pain in left hand: Secondary | ICD-10-CM | POA: Diagnosis not present

## 2023-03-11 DIAGNOSIS — M25442 Effusion, left hand: Secondary | ICD-10-CM | POA: Diagnosis not present

## 2023-03-11 DIAGNOSIS — Z4789 Encounter for other orthopedic aftercare: Secondary | ICD-10-CM | POA: Diagnosis not present

## 2023-03-13 DIAGNOSIS — M25532 Pain in left wrist: Secondary | ICD-10-CM | POA: Diagnosis not present

## 2023-03-13 DIAGNOSIS — M25642 Stiffness of left hand, not elsewhere classified: Secondary | ICD-10-CM | POA: Diagnosis not present

## 2023-03-13 DIAGNOSIS — M79642 Pain in left hand: Secondary | ICD-10-CM | POA: Diagnosis not present

## 2023-03-13 DIAGNOSIS — M25442 Effusion, left hand: Secondary | ICD-10-CM | POA: Diagnosis not present

## 2023-03-13 DIAGNOSIS — R202 Paresthesia of skin: Secondary | ICD-10-CM | POA: Diagnosis not present

## 2023-03-13 DIAGNOSIS — M25432 Effusion, left wrist: Secondary | ICD-10-CM | POA: Diagnosis not present

## 2023-03-13 DIAGNOSIS — Z4789 Encounter for other orthopedic aftercare: Secondary | ICD-10-CM | POA: Diagnosis not present

## 2023-03-13 DIAGNOSIS — M25632 Stiffness of left wrist, not elsewhere classified: Secondary | ICD-10-CM | POA: Diagnosis not present

## 2023-03-22 ENCOUNTER — Other Ambulatory Visit: Payer: Self-pay | Admitting: Family Medicine

## 2023-03-25 DIAGNOSIS — M25432 Effusion, left wrist: Secondary | ICD-10-CM | POA: Diagnosis not present

## 2023-03-25 DIAGNOSIS — M25642 Stiffness of left hand, not elsewhere classified: Secondary | ICD-10-CM | POA: Diagnosis not present

## 2023-03-25 DIAGNOSIS — M79642 Pain in left hand: Secondary | ICD-10-CM | POA: Diagnosis not present

## 2023-03-25 DIAGNOSIS — M25632 Stiffness of left wrist, not elsewhere classified: Secondary | ICD-10-CM | POA: Diagnosis not present

## 2023-03-25 DIAGNOSIS — Z4789 Encounter for other orthopedic aftercare: Secondary | ICD-10-CM | POA: Diagnosis not present

## 2023-03-25 DIAGNOSIS — R202 Paresthesia of skin: Secondary | ICD-10-CM | POA: Diagnosis not present

## 2023-03-25 DIAGNOSIS — M25532 Pain in left wrist: Secondary | ICD-10-CM | POA: Diagnosis not present

## 2023-03-25 DIAGNOSIS — M25442 Effusion, left hand: Secondary | ICD-10-CM | POA: Diagnosis not present

## 2023-03-27 DIAGNOSIS — M25432 Effusion, left wrist: Secondary | ICD-10-CM | POA: Diagnosis not present

## 2023-03-27 DIAGNOSIS — M79642 Pain in left hand: Secondary | ICD-10-CM | POA: Diagnosis not present

## 2023-03-27 DIAGNOSIS — M25642 Stiffness of left hand, not elsewhere classified: Secondary | ICD-10-CM | POA: Diagnosis not present

## 2023-03-27 DIAGNOSIS — Z4789 Encounter for other orthopedic aftercare: Secondary | ICD-10-CM | POA: Diagnosis not present

## 2023-03-27 DIAGNOSIS — M25442 Effusion, left hand: Secondary | ICD-10-CM | POA: Diagnosis not present

## 2023-03-27 DIAGNOSIS — M25632 Stiffness of left wrist, not elsewhere classified: Secondary | ICD-10-CM | POA: Diagnosis not present

## 2023-03-27 DIAGNOSIS — R202 Paresthesia of skin: Secondary | ICD-10-CM | POA: Diagnosis not present

## 2023-03-27 DIAGNOSIS — M25532 Pain in left wrist: Secondary | ICD-10-CM | POA: Diagnosis not present

## 2023-04-01 DIAGNOSIS — M25632 Stiffness of left wrist, not elsewhere classified: Secondary | ICD-10-CM | POA: Diagnosis not present

## 2023-04-01 DIAGNOSIS — M25532 Pain in left wrist: Secondary | ICD-10-CM | POA: Diagnosis not present

## 2023-04-01 DIAGNOSIS — M25432 Effusion, left wrist: Secondary | ICD-10-CM | POA: Diagnosis not present

## 2023-04-01 DIAGNOSIS — M79642 Pain in left hand: Secondary | ICD-10-CM | POA: Diagnosis not present

## 2023-04-01 DIAGNOSIS — M25442 Effusion, left hand: Secondary | ICD-10-CM | POA: Diagnosis not present

## 2023-04-01 DIAGNOSIS — Z4789 Encounter for other orthopedic aftercare: Secondary | ICD-10-CM | POA: Diagnosis not present

## 2023-04-01 DIAGNOSIS — M25642 Stiffness of left hand, not elsewhere classified: Secondary | ICD-10-CM | POA: Diagnosis not present

## 2023-04-01 DIAGNOSIS — R202 Paresthesia of skin: Secondary | ICD-10-CM | POA: Diagnosis not present

## 2023-04-02 DIAGNOSIS — S62327A Displaced fracture of shaft of fifth metacarpal bone, left hand, initial encounter for closed fracture: Secondary | ICD-10-CM | POA: Diagnosis not present

## 2023-04-03 DIAGNOSIS — M25642 Stiffness of left hand, not elsewhere classified: Secondary | ICD-10-CM | POA: Diagnosis not present

## 2023-04-03 DIAGNOSIS — M25632 Stiffness of left wrist, not elsewhere classified: Secondary | ICD-10-CM | POA: Diagnosis not present

## 2023-04-03 DIAGNOSIS — M25432 Effusion, left wrist: Secondary | ICD-10-CM | POA: Diagnosis not present

## 2023-04-03 DIAGNOSIS — Z4789 Encounter for other orthopedic aftercare: Secondary | ICD-10-CM | POA: Diagnosis not present

## 2023-04-03 DIAGNOSIS — M79642 Pain in left hand: Secondary | ICD-10-CM | POA: Diagnosis not present

## 2023-04-03 DIAGNOSIS — M25532 Pain in left wrist: Secondary | ICD-10-CM | POA: Diagnosis not present

## 2023-04-03 DIAGNOSIS — R202 Paresthesia of skin: Secondary | ICD-10-CM | POA: Diagnosis not present

## 2023-04-03 DIAGNOSIS — M25442 Effusion, left hand: Secondary | ICD-10-CM | POA: Diagnosis not present

## 2023-04-08 DIAGNOSIS — M25442 Effusion, left hand: Secondary | ICD-10-CM | POA: Diagnosis not present

## 2023-04-08 DIAGNOSIS — M25642 Stiffness of left hand, not elsewhere classified: Secondary | ICD-10-CM | POA: Diagnosis not present

## 2023-04-08 DIAGNOSIS — M25432 Effusion, left wrist: Secondary | ICD-10-CM | POA: Diagnosis not present

## 2023-04-08 DIAGNOSIS — M25632 Stiffness of left wrist, not elsewhere classified: Secondary | ICD-10-CM | POA: Diagnosis not present

## 2023-04-08 DIAGNOSIS — R202 Paresthesia of skin: Secondary | ICD-10-CM | POA: Diagnosis not present

## 2023-04-08 DIAGNOSIS — M79642 Pain in left hand: Secondary | ICD-10-CM | POA: Diagnosis not present

## 2023-04-08 DIAGNOSIS — Z4789 Encounter for other orthopedic aftercare: Secondary | ICD-10-CM | POA: Diagnosis not present

## 2023-04-08 DIAGNOSIS — M25532 Pain in left wrist: Secondary | ICD-10-CM | POA: Diagnosis not present

## 2023-04-10 ENCOUNTER — Ambulatory Visit (INDEPENDENT_AMBULATORY_CARE_PROVIDER_SITE_OTHER): Payer: Medicare Other

## 2023-04-10 VITALS — Ht 61.0 in | Wt 195.0 lb

## 2023-04-10 DIAGNOSIS — Z0000 Encounter for general adult medical examination without abnormal findings: Secondary | ICD-10-CM

## 2023-04-10 NOTE — Progress Notes (Signed)
 Because this visit was a virtual/telehealth visit,  certain criteria was not obtained, such a blood pressure, CBG if applicable, and timed get up and go. Any medications not marked as "taking" were not mentioned during the medication reconciliation part of the visit. Any vitals not documented were not able to be obtained due to this being a telehealth visit or patient was unable to self-report a recent blood pressure reading due to a lack of equipment at home via telehealth. Vitals that have been documented are verbally provided by the patient.   Subjective:   Virginia Nash is a 77 y.o. female who presents for Medicare Annual (Subsequent) preventive examination.  Visit Complete: Virtual I connected with  Brittnye Hylton Nienow on 04/10/23 by a audio enabled telemedicine application and verified that I am speaking with the correct person using two identifiers.  Patient Location: Home  Provider Location: Home Office  I discussed the limitations of evaluation and management by telemedicine. The patient expressed understanding and agreed to proceed.  Vital Signs: Because this visit was a virtual/telehealth visit, some criteria may be missing or patient reported. Any vitals not documented were not able to be obtained and vitals that have been documented are patient reported.  Patient Medicare AWV questionnaire was completed by the patient on na; I have confirmed that all information answered by patient is correct and no changes since this date.  Cardiac Risk Factors include: advanced age (>72men, >29 women);dyslipidemia;hypertension;obesity (BMI >30kg/m2);sedentary lifestyle     Objective:    Today's Vitals   04/10/23 1101 04/10/23 1106  Weight: 195 lb (88.5 kg)   Height: 5\' 1"  (1.549 m)   PainSc:  4    Body mass index is 36.84 kg/m.     04/10/2023   11:01 AM 02/15/2023    4:48 AM 02/14/2023   10:16 PM 01/15/2022   10:46 AM 01/12/2021   10:41 AM 01/12/2020   10:03 AM 01/12/2020    9:39 AM   Advanced Directives  Does Patient Have a Medical Advance Directive? No No No Yes Yes  Yes  Type of Theme park manager;Living will Healthcare Power of [place];Living will  Living will;Out of facility DNR (pink MOST or yellow form)  Does patient want to make changes to medical advance directive?  No - Patient declined No - Patient declined   No - Patient declined   Copy of Healthcare Power of Attorney in Chart?    No - copy requested No - copy requested    Would patient like information on creating a medical advance directive? No - Patient declined No - Patient declined No - Patient declined        Current Medications (verified) Outpatient Encounter Medications as of 04/10/2023  Medication Sig   albuterol (VENTOLIN HFA) 108 (90 Base) MCG/ACT inhaler Inhale 2 puffs into the lungs every 4 (four) hours as needed for wheezing or shortness of breath.   atorvastatin (LIPITOR) 20 MG tablet Take 1 tablet by mouth once daily   Calcium Carbonate-Vit D-Min (CALCIUM 1200 PO) Take by mouth.   Cholecalciferol (VITAMIN D3) 25 MCG (1000 UT) CAPS Take by mouth.   loratadine (CLARITIN) 10 MG tablet Take 10 mg by mouth daily.   meloxicam (MOBIC) 15 MG tablet Take 15 mg by mouth daily.   omeprazole (PRILOSEC) 20 MG capsule Take 1 capsule by mouth once daily   aspirin EC 81 MG tablet Take 81 mg by mouth daily. Swallow whole. (Patient not taking: Reported  on 04/10/2023)   diclofenac (VOLTAREN) 75 MG EC tablet Take 1 tablet (75 mg total) by mouth 2 (two) times daily. (Patient not taking: Reported on 08/06/2022)   HYDROcodone-acetaminophen (NORCO) 5-325 MG tablet Take 1-2 tablets by mouth every 6 (six) hours as needed. (Patient not taking: Reported on 04/10/2023)   ondansetron (ZOFRAN) 4 MG tablet Take 1 tablet (4 mg total) by mouth every 8 (eight) hours as needed for nausea or vomiting. (Patient not taking: Reported on 04/10/2023)   ondansetron (ZOFRAN-ODT) 8 MG disintegrating tablet  8mg  ODT q6 hours prn nausea (Patient not taking: Reported on 04/10/2023)   No facility-administered encounter medications on file as of 04/10/2023.    Allergies (verified) Asparagus, Codeine, Other, and Peanut oil   History: Past Medical History:  Diagnosis Date   Allergy    Arthritis    Asthma    as child   Biliary dyskinesia    Blood transfusion without reported diagnosis    Cancer (HCC)    Cataract    Collagen vascular disease (HCC)    Past Surgical History:  Procedure Laterality Date   ABDOMINAL HYSTERECTOMY     CHOLECYSTECTOMY N/A 04/12/2015   Procedure: LAPAROSCOPIC CHOLECYSTECTOMY;  Surgeon: Jimmye Norman, MD;  Location: [facility];  Service: General;  Laterality: N/A;   COLONOSCOPY N/A 07/31/2017   Procedure: COLONOSCOPY;  Surgeon: Malissa Hippo, MD;  Location: [facility];  Service: Endoscopy;  Laterality: N/A;  830   DILATION AND CURETTAGE OF UTERUS     ESOPHAGEAL DILATION N/A 01/16/2019   Procedure: ESOPHAGEAL DILATION;  Surgeon: Malissa Hippo, MD;  Location: [facility];  Service: Endoscopy;  Laterality: N/A;   ESOPHAGOGASTRODUODENOSCOPY (EGD) WITH PROPOFOL N/A 01/16/2019   Procedure: ESOPHAGOGASTRODUODENOSCOPY (EGD) WITH PROPOFOL;  Surgeon: Malissa Hippo, MD;  Location: [facility];  Service: Endoscopy;  Laterality: N/A;  145pm   EYE SURGERY     cataract with lens replacement   TONSILLECTOMY     Family History  Problem Relation Age of Onset   Arthritis Mother    Cancer Mother    Cancer Father    Hearing loss Sister    Stroke Maternal Grandmother    Kidney disease Paternal Grandmother    Thyroid disease Daughter    Social History   Socioeconomic History   Marital status: Widowed    Spouse name: Mellody Dance   Number of children: 3   Years of education: 12th Grade   Highest education level: High school graduate  Occupational History   Occupation: Retired  Tobacco Use   Smoking status: Never   Smokeless tobacco: Never  Vaping Use    Vaping status: Never Used  Substance and Sexual Activity   Alcohol use: No   Drug use: No   Sexual activity: Yes  Other Topics Concern   Not on file  Social History Narrative   Retired. 3 grown children. Enjoys sewing and crafting.    Husband passed away around 02/21/2020   She doesn't drive, but her children drive her and get her anything she needs   Social Determinants of Health   Financial Resource Strain: Low Risk  (04/10/2023)   Overall Financial Resource Strain (CARDIA)    Difficulty of Paying Living Expenses: Not hard at all  Food Insecurity: No Food Insecurity (04/10/2023)   Hunger Vital Sign    Worried About Running Out of Food in the Last Year: Never true    Ran Out of Food in the Last Year: Never true  Transportation Needs: No Transportation Needs (04/10/2023)   PRAPARE - Administrator, Civil Service (Medical): No    Lack of Transportation (Non-Medical): No  Physical Activity: Insufficiently Active (04/10/2023)   Exercise Vital Sign    Days of Exercise per Week: 7 days    Minutes of Exercise per Session: 20 min  Stress: No Stress Concern Present (04/10/2023)   [employer] of Occupational Health - Occupational Stress Questionnaire    Feeling of Stress : Not at all  Social Connections: Moderately Isolated (04/10/2023)   Social Connection and Isolation Panel [NHANES]    Frequency of Communication with Friends and Family: More than three times a week    Frequency of Social Gatherings with Friends and Family: More than three times a week    Attends Religious Services: More than 4 times per year    Active Member of [employer] or Organizations: No    Attends Banker Meetings: Never    Marital Status: Widowed    Tobacco Counseling Counseling given: Yes   Clinical Intake:  Pre-visit preparation completed: Yes  Pain : 0-10 Pain Score: 4  Pain Type: Chronic pain Pain Location: Hand Pain Orientation: Left Pain Descriptors / Indicators:  Constant, Aching Pain Onset: More than a month ago Pain Frequency: Constant     BMI - recorded: 36.84 Nutritional Status: BMI > 30  Obese Nutritional Risks: None Diabetes: No  How often do you need to have someone help you when you read instructions, pamphlets, or other written materials from your doctor or pharmacy?: 1 - Never  Interpreter Needed?: No  Information entered by ::  W. CMA   Activities of Daily Living    04/10/2023   11:14 AM  In your present state of health, do you have any difficulty performing the following activities:  Hearing? 0  Vision? 0  Difficulty concentrating or making decisions? 0  Walking or climbing stairs? 0  Dressing or bathing? 0  Doing errands, shopping? 0  Preparing Food and eating ? N  Using the Toilet? N  In the past six months, have you accidently leaked urine? N  Do you have problems with loss of bowel control? N  Managing your Medications? N  Managing your Finances? N  Housekeeping or managing your Housekeeping? N    Patient Care Team: Raliegh Ip, DO as PCP - General (Family Medicine) Ernestina Penna, MD (Unknown Physician Specialty) Sallye Lat, MD as Consulting Physician (Ophthalmology) Marguerita Merles, Reuel Boom, MD as Consulting Physician (Gastroenterology) Delton Coombes Les Pou, MD as Consulting Physician (Pulmonary Disease) Wyline Mood Dorothe Pea, MD as Consulting Physician (Cardiology)  Indicate any recent Medical Services you may have received from other than [facility] providers in the past year (date may be approximate).     Assessment:   This is a routine wellness examination for Karsynn.  Hearing/Vision screen Hearing Screening - Comments:: Patient denies any hearing difficulties.   Vision Screening - Comments:: Wears reading glasses - up to date with routine eye exams  Dr. Dione Booze   Goals Addressed             This Visit's Progress    Patient Stated       I want my hand to get back to where it was before  I fell and broke it.        Depression Screen    04/10/2023   11:09 AM 08/06/2022    4:10 PM 01/15/2022   10:48 AM 01/01/2022   11:13 AM 08/28/2021  9:59 AM 03/21/2021   10:29 AM 03/21/2021   10:16 AM  PHQ 2/9 Scores  PHQ - 2 Score 0 0 0 0 0 0 0  PHQ- 9 Score 0 0 0  5 4     Fall Risk    04/10/2023   11:14 AM 08/06/2022    4:10 PM 03/27/2022   11:11 AM 01/15/2022   10:38 AM 01/01/2022   11:13 AM  Fall Risk   Falls in the past year?  0 1 1 0  Number falls in past yr: 0 0 0 0   Injury with Fall? 1 0 0 0   Risk for fall due to : History of fall(s) No Fall Risks History of fall(s) Orthopedic patient;Medication side effect;History of fall(s)   Follow up Falls prevention discussed;Education provided Education provided Falls evaluation completed Falls prevention discussed;Education provided     MEDICARE RISK AT HOME: Medicare Risk at Home Any stairs in or around the home?: Yes If so, are there any without handrails?: No Home free of loose throw rugs in walkways, pet beds, electrical cords, etc?: Yes Adequate lighting in your home to reduce risk of falls?: Yes Life alert?: No Use of a cane, walker or w/c?: No Grab bars in the bathroom?: No Shower chair or bench in shower?: Yes Elevated toilet seat or a handicapped toilet?: No  TIMED UP AND GO:  Was the test performed?  No    Cognitive Function:    12/31/2017    9:23 AM 12/25/2016    9:42 AM  MMSE - Mini Mental State Exam  Orientation to time 5 5  Orientation to Place 5 5  Registration 3 3  Attention/ Calculation 5 5  Recall 3 3  Language- name 2 objects 2 2  Language- repeat 1 1  Language- follow 3 step command 3 3  Language- read & follow direction 1 1  Write a sentence 1 1  Copy design 1 1  Total score 30 30        04/10/2023   11:09 AM 01/15/2022   10:50 AM 01/12/2020    9:46 AM 01/07/2019    9:39 AM  6CIT Screen  What Year? 0 points 0 points 0 points 0 points  What month? 0 points 0 points 0 points 0 points   What time? 0 points 0 points 0 points 0 points  Count back from 20 0 points 0 points 0 points 0 points  Months in reverse 0 points 0 points 0 points 0 points  Repeat phrase 0 points 0 points 0 points 0 points  Total Score 0 points 0 points 0 points 0 points    Immunizations Immunization History  Administered Date(s) Administered   Fluad Quad(high Dose 65+) 03/02/2020, 02/15/2021, 03/27/2022   Influenza, High Dose Seasonal PF 02/02/2019, 02/10/2019   Influenza,inj,Quad PF,6+ Mos 07/23/2018   Influenza,inj,quad, With Preservative 02/17/2018   Influenza-Unspecified 03/02/2015, 03/16/2016, 01/30/2017   Moderna Sars-Covid-2 Vaccination 06/20/2019, 07/20/2019, 03/17/2020, 09/21/2020   Pneumococcal Conjugate-13 10/21/2013   Pneumococcal Polysaccharide-23 02/15/2015   Tdap 01/23/2019, 02/10/2019   Zoster Recombinant(Shingrix) 02/01/2021, 05/01/2021    TDAP status: Up to date  Flu Vaccine status: Up to date  Pneumococcal vaccine status: Up to date  Covid-19 vaccine status: Information provided on how to obtain vaccines.   Qualifies for Shingles Vaccine? Yes   Zostavax completed No   Shingrix Completed?: Yes  Screening Tests Health Maintenance  Topic Date Due   Medicare Annual Wellness (AWV)  01/16/2023   COVID-19 Vaccine (  5 - 2023-24 season) 01/20/2023   DEXA SCAN  01/02/2024   MAMMOGRAM  02/11/2024   Colonoscopy  08/01/2027   DTaP/Tdap/Td (3 - Td or Tdap) 02/09/2029   Pneumonia Vaccine 3+ Years old  Completed   INFLUENZA VACCINE  Completed   Hepatitis C Screening  Completed   Zoster Vaccines- Shingrix  Completed   HPV VACCINES  Aged Out    Health Maintenance  Health Maintenance Due  Topic Date Due   Medicare Annual Wellness (AWV)  01/16/2023   COVID-19 Vaccine (5 - 2023-24 season) 01/20/2023    Colorectal cancer screening: Type of screening: Colonoscopy. Completed 07/31/2017. Repeat every 10 years  Mammogram status: Completed 02/11/2023. Repeat every year  Bone  Density status: Completed 01/01/2022. Results reflect: Bone density results: NORMAL. Repeat every 2 years.  Lung Cancer Screening: (Low Dose CT Chest recommended if Age 43-80 years, 20 pack-year currently smoking OR have quit w/in 15years.) does not qualify.   Lung Cancer Screening Referral: na  Additional Screening:  Hepatitis C Screening: does not qualify; Completed 12/19/2016  Vision Screening: Recommended annual ophthalmology exams for early detection of glaucoma and other disorders of the eye. Is the patient up to date with their annual eye exam?  Yes  Who is the provider or what is the name of the office in which the patient attends annual eye exams? DR. Dione Booze If pt is not established with a provider, would they like to be referred to a provider to establish care? No .   Dental Screening: Recommended annual dental exams for proper oral hygiene  Diabetic Foot Exam: na  Community Resource Referral / Chronic Care Management: CRR required this visit?  No   CCM required this visit?  No     Plan:     I have personally reviewed and noted the following in the patient's chart:   Medical and social history Use of alcohol, tobacco or illicit drugs  Current medications and supplements including opioid prescriptions. Patient is not currently taking opioid prescriptions. Functional ability and status Nutritional status Physical activity Advanced directives List of other physicians Hospitalizations, surgeries, and ER visits in previous 12 months Vitals Screenings to include cognitive, depression, and falls Referrals and appointments  In addition, I have reviewed and discussed with patient certain preventive protocols, quality metrics, and best practice recommendations. A written personalized care plan for preventive services as well as general preventive health recommendations were provided to patient.     Jordan Hawks Kaycen Whitworth, CMA   04/10/2023   After Visit Summary: (Mail) Due to  this being a telephonic visit, the after visit summary with patients personalized plan was offered to patient via mail   Nurse Notes: none

## 2023-04-10 NOTE — Patient Instructions (Signed)
Virginia Nash , Thank you for taking time to come for your Medicare Wellness Visit. I appreciate your ongoing commitment to your health goals. Please review the following plan we discussed and let me know if I can assist you in the future.   Referrals/Orders/Follow-Ups/Clinician Recommendations:  Next Medicare Annual Wellness Visit: April 11, 2023 at 10:00 am virtual  This is a list of the screening recommended for you and due dates:  Health Maintenance  Topic Date Due   COVID-19 Vaccine (5 - 2023-24 season) 01/20/2023   DEXA scan (bone density measurement)  01/02/2024   Mammogram  02/11/2024   Medicare Annual Wellness Visit  04/09/2024   Colon Cancer Screening  08/01/2027   DTaP/Tdap/Td vaccine (3 - Td or Tdap) 02/09/2029   Pneumonia Vaccine  Completed   Flu Shot  Completed   Hepatitis C Screening  Completed   Zoster (Shingles) Vaccine  Completed   HPV Vaccine  Aged Out    Advanced directives: (ACP Link)Information on Advanced Care Planning can be found at [facility] [place] Advance Health Care Directives Advance Health Care Directives ([email or web address]   Next Medicare Annual Wellness Visit scheduled for next year: Yes  Preventive Care 75 Years and Older, Female Preventive care refers to lifestyle choices and visits with your health care provider that can promote health and wellness. Preventive care visits are also called wellness exams. What can I expect for my preventive care visit? Counseling Your health care provider may ask you questions about your: Medical history, including: Past medical problems. Family medical history. Pregnancy and menstrual history. History of falls. Current health, including: Memory and ability to understand (cognition). Emotional well-being. Home life and relationship well-being. Sexual activity and sexual health. Lifestyle, including: Alcohol, nicotine or tobacco, and drug use. Access to firearms. Diet, exercise, and sleep  habits. Work and work Astronomer. Sunscreen use. Safety issues such as seatbelt and bike helmet use. Physical exam Your health care provider will check your: Height and weight. These may be used to calculate your BMI (body mass index). BMI is a measurement that tells if you are at a healthy weight. Waist circumference. This measures the distance around your waistline. This measurement also tells if you are at a healthy weight and may help predict your risk of certain diseases, such as type 2 diabetes and high blood pressure. Heart rate and blood pressure. Body temperature. Skin for abnormal spots. What immunizations do I need?  Vaccines are usually given at various ages, according to a schedule. Your health care provider will recommend vaccines for you based on your age, medical history, and lifestyle or other factors, such as travel or where you work. What tests do I need? Screening Your health care provider may recommend screening tests for certain conditions. This may include: Lipid and cholesterol levels. Hepatitis C test. Hepatitis B test. HIV (human immunodeficiency virus) test. STI (sexually transmitted infection) testing, if you are at risk. Lung cancer screening. Colorectal cancer screening. Diabetes screening. This is done by checking your blood sugar (glucose) after you have not eaten for a while (fasting). Mammogram. Talk with your health care provider about how often you should have regular mammograms. BRCA-related cancer screening. This may be done if you have a family history of breast, ovarian, tubal, or peritoneal cancers. Bone density scan. This is done to screen for osteoporosis. Talk with your health care provider about your test results, treatment options, and if necessary, the need for more tests. Follow these instructions at home: Eating and  drinking  Eat a diet that includes fresh fruits and vegetables, whole grains, lean protein, and low-fat dairy products.  Limit your intake of foods with high amounts of sugar, saturated fats, and salt. Take vitamin and mineral supplements as recommended by your health care provider. Do not drink alcohol if your health care provider tells you not to drink. If you drink alcohol: Limit how much you have to 0-1 drink a day. Know how much alcohol is in your drink. In the [place] one drink equals one 12 oz bottle of beer (355 mL), one 5 oz glass of wine (148 mL), or one 1 oz glass of hard liquor (44 mL). Lifestyle Brush your teeth every morning and night with fluoride toothpaste. Floss one time each day. Exercise for at least 30 minutes 5 or more days each week. Do not use any products that contain nicotine or tobacco. These products include cigarettes, chewing tobacco, and vaping devices, such as e-cigarettes. If you need help quitting, ask your health care provider. Do not use drugs. If you are sexually active, practice safe sex. Use a condom or other form of protection in order to prevent STIs. Take aspirin only as told by your health care provider. Make sure that you understand how much to take and what form to take. Work with your health care provider to find out whether it is safe and beneficial for you to take aspirin daily. Ask your health care provider if you need to take a cholesterol-lowering medicine (statin). Find healthy ways to manage stress, such as: Meditation, yoga, or listening to music. Journaling. Talking to a trusted person. Spending time with friends and family. Minimize exposure to UV radiation to reduce your risk of skin cancer. Safety Always wear your seat belt while driving or riding in a vehicle. Do not drive: If you have been drinking alcohol. Do not ride with someone who has been drinking. When you are tired or distracted. While texting. If you have been using any mind-altering substances or drugs. Wear a helmet and other protective equipment during sports activities. If you have  firearms in your house, make sure you follow all gun safety procedures. What's next? Visit your health care provider once a year for an annual wellness visit. Ask your health care provider how often you should have your eyes and teeth checked. Stay up to date on all vaccines. This information is not intended to replace advice given to you by your health care provider. Make sure you discuss any questions you have with your health care provider. Document Revised: 11/02/2020 Document Reviewed: 11/02/2020 Elsevier Patient Education  2024 [employer]. Understanding Your Risk for Falls Millions of people have serious injuries from falls each year. It is important to understand your risk of falling. Talk with your health care provider about your risk and what you can do to lower it. If you do have a serious fall, make sure to tell your provider. Falling once raises your risk of falling again. How can falls affect me? Serious injuries from falls are common. These include: Broken bones, such as hip fractures. Head injuries, such as traumatic brain injuries (TBI) or concussions. A fear of falling can cause you to avoid activities and stay at home. This can make your muscles weaker and raise your risk for a fall. What can increase my risk? There are a number of risk factors that increase your risk for falling. The more risk factors you have, the higher your risk of falling. Serious injuries  from a fall happen most often to people who are older than 75 years old. Teenagers and young adults ages 40-29 are also at higher risk. Common risk factors include: Weakness in the lower body. Being generally weak or confused due to long-term (chronic) illness. Dizziness or balance problems. Poor vision. Medicines that cause dizziness or drowsiness. These may include: Medicines for your blood pressure, heart, anxiety, insomnia, or swelling (edema). Pain medicines. Muscle relaxants. Other risk factors  include: Drinking alcohol. Having had a fall in the past. Having foot pain or wearing improper footwear. Working at a dangerous job. Having any of the following in your home: Tripping hazards, such as floor clutter or loose rugs. Poor lighting. Pets. Having dementia or memory loss. What actions can I take to lower my risk of falling?     Physical activity Stay physically fit. Do strength and balance exercises. Consider taking a regular class to build strength and balance. Yoga and tai chi are good options. Vision Have your eyes checked every year and your prescription for glasses or contacts updated as needed. Shoes and walking aids Wear non-skid shoes. Wear shoes that have rubber soles and low heels. Do not wear high heels. Do not walk around the house in socks or slippers. Use a cane or walker as told by your provider. Home safety Attach secure railings on both sides of your stairs. Install grab bars for your bathtub, shower, and toilet. Use a non-skid mat in your bathtub or shower. Attach bath mats securely with double-sided, non-slip rug tape. Use good lighting in all rooms. Keep a flashlight near your bed. Make sure there is a clear path from your bed to the bathroom. Use night-lights. Do not use throw rugs. Make sure all carpeting is taped or tacked down securely. Remove all clutter from walkways and stairways, including extension cords. Repair uneven or broken steps and floors. Avoid walking on icy or slippery surfaces. Walk on the grass instead of on icy or slick sidewalks. Use ice melter to get rid of ice on walkways in the winter. Use a cordless phone. Questions to ask your health care provider Can you help me check my risk for a fall? Do any of my medicines make me more likely to fall? Should I take a vitamin D supplement? What exercises can I do to improve my strength and balance? Should I make an appointment to have my vision checked? Do I need a bone density test  to check for weak bones (osteoporosis)? Would it help to use a cane or a walker? Where to find more information Centers for Disease Control and Prevention, STEADI: TonerPromos.no Community-Based Fall Prevention Programs: TonerPromos.no [employer] on Aging: BaseRingTones.pl Contact a health care provider if: You fall at home. You are afraid of falling at home. You feel weak, drowsy, or dizzy. This information is not intended to replace advice given to you by your health care provider. Make sure you discuss any questions you have with your health care provider. Document Revised: 01/08/2022 Document Reviewed: 01/08/2022 Elsevier Patient Education  2024 [employer].

## 2023-04-11 DIAGNOSIS — M25632 Stiffness of left wrist, not elsewhere classified: Secondary | ICD-10-CM | POA: Diagnosis not present

## 2023-04-11 DIAGNOSIS — M25432 Effusion, left wrist: Secondary | ICD-10-CM | POA: Diagnosis not present

## 2023-04-11 DIAGNOSIS — M25532 Pain in left wrist: Secondary | ICD-10-CM | POA: Diagnosis not present

## 2023-04-11 DIAGNOSIS — R202 Paresthesia of skin: Secondary | ICD-10-CM | POA: Diagnosis not present

## 2023-04-11 DIAGNOSIS — M25642 Stiffness of left hand, not elsewhere classified: Secondary | ICD-10-CM | POA: Diagnosis not present

## 2023-04-11 DIAGNOSIS — Z4789 Encounter for other orthopedic aftercare: Secondary | ICD-10-CM | POA: Diagnosis not present

## 2023-04-11 DIAGNOSIS — M25442 Effusion, left hand: Secondary | ICD-10-CM | POA: Diagnosis not present

## 2023-04-11 DIAGNOSIS — M79642 Pain in left hand: Secondary | ICD-10-CM | POA: Diagnosis not present

## 2023-04-15 DIAGNOSIS — Z4789 Encounter for other orthopedic aftercare: Secondary | ICD-10-CM | POA: Diagnosis not present

## 2023-04-15 DIAGNOSIS — M79642 Pain in left hand: Secondary | ICD-10-CM | POA: Diagnosis not present

## 2023-04-15 DIAGNOSIS — M25642 Stiffness of left hand, not elsewhere classified: Secondary | ICD-10-CM | POA: Diagnosis not present

## 2023-04-15 DIAGNOSIS — M25442 Effusion, left hand: Secondary | ICD-10-CM | POA: Diagnosis not present

## 2023-04-15 DIAGNOSIS — M25532 Pain in left wrist: Secondary | ICD-10-CM | POA: Diagnosis not present

## 2023-04-15 DIAGNOSIS — R202 Paresthesia of skin: Secondary | ICD-10-CM | POA: Diagnosis not present

## 2023-04-15 DIAGNOSIS — M25432 Effusion, left wrist: Secondary | ICD-10-CM | POA: Diagnosis not present

## 2023-04-15 DIAGNOSIS — M25632 Stiffness of left wrist, not elsewhere classified: Secondary | ICD-10-CM | POA: Diagnosis not present

## 2023-04-16 DIAGNOSIS — M25532 Pain in left wrist: Secondary | ICD-10-CM | POA: Diagnosis not present

## 2023-04-16 DIAGNOSIS — R202 Paresthesia of skin: Secondary | ICD-10-CM | POA: Diagnosis not present

## 2023-04-16 DIAGNOSIS — M25632 Stiffness of left wrist, not elsewhere classified: Secondary | ICD-10-CM | POA: Diagnosis not present

## 2023-04-16 DIAGNOSIS — M25442 Effusion, left hand: Secondary | ICD-10-CM | POA: Diagnosis not present

## 2023-04-16 DIAGNOSIS — M25432 Effusion, left wrist: Secondary | ICD-10-CM | POA: Diagnosis not present

## 2023-04-16 DIAGNOSIS — M79642 Pain in left hand: Secondary | ICD-10-CM | POA: Diagnosis not present

## 2023-04-16 DIAGNOSIS — M25642 Stiffness of left hand, not elsewhere classified: Secondary | ICD-10-CM | POA: Diagnosis not present

## 2023-04-16 DIAGNOSIS — Z4789 Encounter for other orthopedic aftercare: Secondary | ICD-10-CM | POA: Diagnosis not present

## 2023-04-22 DIAGNOSIS — M79642 Pain in left hand: Secondary | ICD-10-CM | POA: Diagnosis not present

## 2023-04-22 DIAGNOSIS — M25432 Effusion, left wrist: Secondary | ICD-10-CM | POA: Diagnosis not present

## 2023-04-22 DIAGNOSIS — M25532 Pain in left wrist: Secondary | ICD-10-CM | POA: Diagnosis not present

## 2023-04-22 DIAGNOSIS — M25632 Stiffness of left wrist, not elsewhere classified: Secondary | ICD-10-CM | POA: Diagnosis not present

## 2023-04-22 DIAGNOSIS — Z4789 Encounter for other orthopedic aftercare: Secondary | ICD-10-CM | POA: Diagnosis not present

## 2023-04-22 DIAGNOSIS — R202 Paresthesia of skin: Secondary | ICD-10-CM | POA: Diagnosis not present

## 2023-04-22 DIAGNOSIS — M25642 Stiffness of left hand, not elsewhere classified: Secondary | ICD-10-CM | POA: Diagnosis not present

## 2023-04-22 DIAGNOSIS — M25442 Effusion, left hand: Secondary | ICD-10-CM | POA: Diagnosis not present

## 2023-04-24 DIAGNOSIS — M25432 Effusion, left wrist: Secondary | ICD-10-CM | POA: Diagnosis not present

## 2023-04-24 DIAGNOSIS — M79642 Pain in left hand: Secondary | ICD-10-CM | POA: Diagnosis not present

## 2023-04-24 DIAGNOSIS — R202 Paresthesia of skin: Secondary | ICD-10-CM | POA: Diagnosis not present

## 2023-04-24 DIAGNOSIS — M25532 Pain in left wrist: Secondary | ICD-10-CM | POA: Diagnosis not present

## 2023-04-24 DIAGNOSIS — Z4789 Encounter for other orthopedic aftercare: Secondary | ICD-10-CM | POA: Diagnosis not present

## 2023-04-24 DIAGNOSIS — M25642 Stiffness of left hand, not elsewhere classified: Secondary | ICD-10-CM | POA: Diagnosis not present

## 2023-04-24 DIAGNOSIS — M25632 Stiffness of left wrist, not elsewhere classified: Secondary | ICD-10-CM | POA: Diagnosis not present

## 2023-04-24 DIAGNOSIS — M25442 Effusion, left hand: Secondary | ICD-10-CM | POA: Diagnosis not present

## 2023-04-29 DIAGNOSIS — M25442 Effusion, left hand: Secondary | ICD-10-CM | POA: Diagnosis not present

## 2023-04-29 DIAGNOSIS — M25632 Stiffness of left wrist, not elsewhere classified: Secondary | ICD-10-CM | POA: Diagnosis not present

## 2023-04-29 DIAGNOSIS — M25532 Pain in left wrist: Secondary | ICD-10-CM | POA: Diagnosis not present

## 2023-04-29 DIAGNOSIS — M25642 Stiffness of left hand, not elsewhere classified: Secondary | ICD-10-CM | POA: Diagnosis not present

## 2023-04-29 DIAGNOSIS — M25432 Effusion, left wrist: Secondary | ICD-10-CM | POA: Diagnosis not present

## 2023-04-29 DIAGNOSIS — Z4789 Encounter for other orthopedic aftercare: Secondary | ICD-10-CM | POA: Diagnosis not present

## 2023-04-29 DIAGNOSIS — M79642 Pain in left hand: Secondary | ICD-10-CM | POA: Diagnosis not present

## 2023-04-29 DIAGNOSIS — R202 Paresthesia of skin: Secondary | ICD-10-CM | POA: Diagnosis not present

## 2023-04-30 DIAGNOSIS — S62327D Displaced fracture of shaft of fifth metacarpal bone, left hand, subsequent encounter for fracture with routine healing: Secondary | ICD-10-CM | POA: Diagnosis not present

## 2023-05-01 DIAGNOSIS — M25432 Effusion, left wrist: Secondary | ICD-10-CM | POA: Diagnosis not present

## 2023-05-01 DIAGNOSIS — M25642 Stiffness of left hand, not elsewhere classified: Secondary | ICD-10-CM | POA: Diagnosis not present

## 2023-05-01 DIAGNOSIS — M25532 Pain in left wrist: Secondary | ICD-10-CM | POA: Diagnosis not present

## 2023-05-01 DIAGNOSIS — M25632 Stiffness of left wrist, not elsewhere classified: Secondary | ICD-10-CM | POA: Diagnosis not present

## 2023-05-01 DIAGNOSIS — M79642 Pain in left hand: Secondary | ICD-10-CM | POA: Diagnosis not present

## 2023-05-01 DIAGNOSIS — Z4789 Encounter for other orthopedic aftercare: Secondary | ICD-10-CM | POA: Diagnosis not present

## 2023-05-01 DIAGNOSIS — R202 Paresthesia of skin: Secondary | ICD-10-CM | POA: Diagnosis not present

## 2023-05-01 DIAGNOSIS — M25442 Effusion, left hand: Secondary | ICD-10-CM | POA: Diagnosis not present

## 2023-06-05 ENCOUNTER — Encounter: Payer: Self-pay | Admitting: *Deleted

## 2023-06-14 ENCOUNTER — Encounter: Payer: Medicare Other | Admitting: Family Medicine

## 2023-06-19 ENCOUNTER — Other Ambulatory Visit: Payer: Self-pay | Admitting: Family Medicine

## 2023-06-20 ENCOUNTER — Encounter: Payer: Medicare Other | Admitting: Family Medicine

## 2023-06-25 ENCOUNTER — Encounter: Payer: Medicare Other | Admitting: Family Medicine

## 2023-07-08 ENCOUNTER — Other Ambulatory Visit: Payer: Self-pay | Admitting: Family Medicine

## 2023-07-09 DIAGNOSIS — S62327D Displaced fracture of shaft of fifth metacarpal bone, left hand, subsequent encounter for fracture with routine healing: Secondary | ICD-10-CM | POA: Diagnosis not present

## 2023-07-15 ENCOUNTER — Ambulatory Visit (INDEPENDENT_AMBULATORY_CARE_PROVIDER_SITE_OTHER): Payer: Medicare Other | Admitting: Family Medicine

## 2023-07-15 ENCOUNTER — Encounter: Payer: Self-pay | Admitting: Family Medicine

## 2023-07-15 VITALS — BP 151/72 | HR 68 | Temp 98.3°F | Ht 61.0 in | Wt 196.6 lb

## 2023-07-15 DIAGNOSIS — K2100 Gastro-esophageal reflux disease with esophagitis, without bleeding: Secondary | ICD-10-CM | POA: Diagnosis not present

## 2023-07-15 DIAGNOSIS — G500 Trigeminal neuralgia: Secondary | ICD-10-CM

## 2023-07-15 DIAGNOSIS — J4520 Mild intermittent asthma, uncomplicated: Secondary | ICD-10-CM

## 2023-07-15 DIAGNOSIS — R7309 Other abnormal glucose: Secondary | ICD-10-CM | POA: Diagnosis not present

## 2023-07-15 DIAGNOSIS — Z6837 Body mass index (BMI) 37.0-37.9, adult: Secondary | ICD-10-CM | POA: Diagnosis not present

## 2023-07-15 DIAGNOSIS — N3281 Overactive bladder: Secondary | ICD-10-CM | POA: Diagnosis not present

## 2023-07-15 DIAGNOSIS — E7800 Pure hypercholesterolemia, unspecified: Secondary | ICD-10-CM

## 2023-07-15 MED ORDER — ATORVASTATIN CALCIUM 20 MG PO TABS: 20.0000 mg | ORAL_TABLET | Freq: Every day | ORAL | 4 refills | Status: DC

## 2023-07-15 MED ORDER — OXYBUTYNIN CHLORIDE ER 5 MG PO TB24: 5.0000 mg | ORAL_TABLET | Freq: Every day | ORAL | 3 refills | Status: DC

## 2023-07-15 MED ORDER — ALBUTEROL SULFATE HFA 108 (90 BASE) MCG/ACT IN AERS: 2.0000 | INHALATION_SPRAY | RESPIRATORY_TRACT | 0 refills | Status: DC | PRN

## 2023-07-15 MED ORDER — GABAPENTIN 100 MG PO CAPS: 100.0000 mg | ORAL_CAPSULE | Freq: Two times a day (BID) | ORAL | 3 refills | Status: DC | PRN

## 2023-07-15 NOTE — Progress Notes (Signed)
 Virginia Nash is a 78 y.o. female presents to office today for annual physical exam examination.    Concerns today include: 1.  Trigeminal neuralgia She reports that she has been having some increasing frequency of the trigeminal neuralgia she was experiencing on the left side of her face.  Gabapentin has been helpful and she would like to get a renewal of this since her current supply is pretty old.  She reports that since her last visit she actually tripped over her own foot and fractured her left pinky.  She is status post surgical intervention for that.  She is also had her knee surgery and she is getting around well now.  She admits she continues not to exercise.  2.  Overactive bladder She reports that she has to urinate frequently.  Denies any dysuria, hematuria, vaginal discharge.  She is not sure that she empties her bladder entirely.  She does limit caffeine but does drink enough water throughout the day.  However, her urinary frequency does not correlate with any changes in water intake.  Occupation: retired,  Substance use: none Health Maintenance Due  Topic Date Due   COVID-19 Vaccine (5 - 2024-25 season) 01/20/2023   Refills needed today: all  Immunization History  Administered Date(s) Administered   Fluad Quad(high Dose 65+) 03/02/2020, 02/15/2021, 03/27/2022   Influenza, High Dose Seasonal PF 02/02/2019, 02/10/2019   Influenza,inj,Quad PF,6+ Mos 07/23/2018   Influenza,inj,quad, With Preservative 02/17/2018   Influenza-Unspecified 03/02/2015, 03/16/2016, 01/30/2017   Moderna Sars-Covid-2 Vaccination 06/20/2019, 07/20/2019, 03/17/2020, 09/21/2020   Pneumococcal Conjugate-13 10/21/2013   Pneumococcal Polysaccharide-23 02/15/2015   Tdap 01/23/2019, 02/10/2019   Zoster Recombinant(Shingrix) 02/01/2021, 05/01/2021   Past Medical History:  Diagnosis Date   Allergy    Arthritis    Asthma    as child   Biliary dyskinesia    Blood transfusion without reported  diagnosis    Cancer (HCC)    Cataract    Collagen vascular disease (HCC)    Social History   Socioeconomic History   Marital status: Widowed    Spouse name: Mellody Dance   Number of children: 3   Years of education: 12th Grade   Highest education level: High school graduate  Occupational History   Occupation: Retired  Tobacco Use   Smoking status: Never   Smokeless tobacco: Never  Vaping Use   Vaping status: Never Used  Substance and Sexual Activity   Alcohol use: No   Drug use: No   Sexual activity: Yes  Other Topics Concern   Not on file  Social History Narrative   Retired. 3 grown children. Enjoys sewing and crafting.    Husband passed away around 02-18-20   She doesn't drive, but her children drive her and get her anything she needs   Social Drivers of Corporate investment banker Strain: Low Risk  (04/10/2023)   Overall Financial Resource Strain (CARDIA)    Difficulty of Paying Living Expenses: Not hard at all  Food Insecurity: No Food Insecurity (04/10/2023)   Hunger Vital Sign    Worried About Running Out of Food in the Last Year: Never true    Ran Out of Food in the Last Year: Never true  Transportation Needs: No Transportation Needs (04/10/2023)   PRAPARE - Administrator, Civil Service (Medical): No    Lack of Transportation (Non-Medical): No  Physical Activity: Insufficiently Active (04/10/2023)   Exercise Vital Sign    Days of Exercise per Week: 7 days  Minutes of Exercise per Session: 20 min  Stress: No Stress Concern Present (04/10/2023)   [employer] of Occupational Health - Occupational Stress Questionnaire    Feeling of Stress : Not at all  Social Connections: Moderately Isolated (04/10/2023)   Social Connection and Isolation Panel [NHANES]    Frequency of Communication with Friends and Family: More than three times a week    Frequency of Social Gatherings with Friends and Family: More than three times a week    Attends Religious  Services: More than 4 times per year    Active Member of [employer] or Organizations: No    Attends Banker Meetings: Never    Marital Status: Widowed  Intimate Partner Violence: Not At Risk (04/10/2023)   Humiliation, Afraid, Rape, and Kick questionnaire    Fear of Current or Ex-Partner: No    Emotionally Abused: No    Physically Abused: No    Sexually Abused: No   Past Surgical History:  Procedure Laterality Date   ABDOMINAL HYSTERECTOMY     CHOLECYSTECTOMY N/A 04/12/2015   Procedure: LAPAROSCOPIC CHOLECYSTECTOMY;  Surgeon: Jimmye Norman, MD;  Location: [facility];  Service: General;  Laterality: N/A;   COLONOSCOPY N/A 07/31/2017   Procedure: COLONOSCOPY;  Surgeon: Malissa Hippo, MD;  [facility];  Service: Endoscopy;  Laterality: N/A;  830   DILATION AND CURETTAGE OF UTERUS     ESOPHAGEAL DILATION N/A 01/16/2019   Procedure: ESOPHAGEAL DILATION;  Surgeon: Malissa Hippo, MD;  Location: [facility];  Service: Endoscopy;  Laterality: N/A;   ESOPHAGOGASTRODUODENOSCOPY (EGD) WITH PROPOFOL N/A 01/16/2019   Procedure: ESOPHAGOGASTRODUODENOSCOPY (EGD) WITH PROPOFOL;  Surgeon: Malissa Hippo, MD;  Location: [facility];  Service: Endoscopy;  Laterality: N/A;  145pm   EYE SURGERY     cataract with lens replacement   TONSILLECTOMY     Family History  Problem Relation Age of Onset   Arthritis Mother    Cancer Mother    Cancer Father    Hearing loss Sister    Stroke Maternal Grandmother    Kidney disease Paternal Grandmother    Thyroid disease Daughter     Current Outpatient Medications:    Calcium Carbonate-Vit D-Min (CALCIUM 1200 PO), Take by mouth., Disp: , Rfl:    Cholecalciferol (VITAMIN D3) 25 MCG (1000 UT) CAPS, Take by mouth., Disp: , Rfl:    gabapentin (NEURONTIN) 100 MG capsule, Take 1 capsule (100 mg total) by mouth 2 (two) times daily as needed (nerve pain)., Disp: 180 capsule, Rfl: 3   loratadine (CLARITIN) 10 MG tablet, Take 10  mg by mouth daily., Disp: , Rfl:    meloxicam (MOBIC) 15 MG tablet, Take 15 mg by mouth daily., Disp: , Rfl:    omeprazole (PRILOSEC) 20 MG capsule, Take 1 capsule by mouth once daily, Disp: 90 capsule, Rfl: 0   oxybutynin (DITROPAN XL) 5 MG 24 hr tablet, Take 1 tablet (5 mg total) by mouth at bedtime., Disp: 90 tablet, Rfl: 3   albuterol (VENTOLIN HFA) 108 (90 Base) MCG/ACT inhaler, Inhale 2 puffs into the lungs every 4 (four) hours as needed for wheezing or shortness of breath., Disp: 18 g, Rfl: 0   atorvastatin (LIPITOR) 20 MG tablet, Take 1 tablet (20 mg total) by mouth daily., Disp: 90 tablet, Rfl: 4  Allergies  Allergen Reactions   Asparagus    Codeine Nausea Only   Other     Peas - rash Asparagus - rash Celery -  rash   Peanut Oil Rash and Other (See Comments)    Congestion with nasal scabs      ROS: Review of Systems Pertinent items noted in HPI and remainder of comprehensive ROS otherwise negative.   Skin: Reports upper back itching  Physical exam BP (!) 151/72   Pulse 68   Temp 98.3 F (36.8 C)   Ht 5\' 1"  (1.549 m)   Wt 196 lb 9.6 oz (89.2 kg)   SpO2 97%   BMI 37.15 kg/m  General appearance: alert, cooperative, appears stated age, and moderately obese Head: Normocephalic, without obvious abnormality, atraumatic Eyes: negative findings: lids and lashes normal, conjunctivae and sclerae normal, corneas clear, and pupils equal, round, reactive to light and accomodation Ears: normal TM's and external ear canals both ears Nose: Nares normal. Septum midline. Mucosa normal. No drainage or sinus tenderness. Throat: lips, mucosa, and tongue normal; teeth and gums normal Neck: no adenopathy, supple, symmetrical, trachea midline, and thyroid not enlarged, symmetric, no tenderness/mass/nodules Back: symmetric, no curvature. ROM normal. No CVA tenderness. Lungs: clear to auscultation bilaterally Heart: regular rate and rhythm, S1, S2 normal, no murmur, click, rub or  gallop Abdomen: soft, non-tender; bowel sounds normal; no masses,  no organomegaly Extremities: extremities normal, atraumatic, no cyanosis or edema Pulses: 2+ and symmetric Skin: Skin color, texture, turgor normal. No rashes or lesions multiple pigmented nevi and she has a seborrheic keratosis noted in the left lower flank. Lymph nodes: Cervical, supraclavicular, and axillary nodes normal. Neurologic: Grossly normal      07/15/2023   10:25 AM 04/10/2023   11:09 AM 08/06/2022    4:10 PM  Depression screen PHQ 2/9  Decreased Interest 0 0 0  Down, Depressed, Hopeless 0 0 0  PHQ - 2 Score 0 0 0  Altered sleeping 0 0 0  Tired, decreased energy 0 0 0  Change in appetite 0 0 0  Feeling bad or failure about yourself  0 0 0  Trouble concentrating 0 0 0  Moving slowly or fidgety/restless 0 0 0  Suicidal thoughts 0 0 0  PHQ-9 Score 0 0 0  Difficult doing work/chores Not difficult at all Not difficult at all Not difficult at all      07/15/2023   10:25 AM 08/06/2022    4:09 PM 01/01/2022   11:14 AM 08/28/2021    9:59 AM  GAD 7 : Generalized Anxiety Score  Nervous, Anxious, on Edge 0 0 0 0  Control/stop worrying 0 0 0 0  Worry too much - different things 0 0 0 0  Trouble relaxing 0 0 1 1  Restless 0 0 0 1  Easily annoyed or irritable 0 0 0 0  Afraid - awful might happen 0 0 0 0  Total GAD 7 Score 0 0 1 2  Anxiety Difficulty  Not difficult at all Not difficult at all Somewhat difficult     Assessment/ Plan: Aundra Dubin here for annual physical exam.   Pure hypercholesterolemia - Plan: atorvastatin (LIPITOR) 20 MG tablet, CMP14+EGFR, Lipid Panel, TSH  Morbid obesity (HCC) - Plan: CMP14+EGFR  BMI 37.0-37.9, adult  Gastroesophageal reflux disease with esophagitis without hemorrhage - Plan: CBC  Mild intermittent asthma without complication - Plan: albuterol (VENTOLIN HFA) 108 (90 Base) MCG/ACT inhaler, CBC  Overactive bladder - Plan: oxybutynin (DITROPAN XL) 5 MG 24 hr  tablet  Trigeminal neuralgia of left side of face - Plan: gabapentin (NEURONTIN) 100 MG capsule  CPE completed.  Fasting labs collected.  Meds have been renewed.  Chronic issues are stable.  Trial Ditropan.  Follow-up in the next 2 to 3 months for recheck.  Needs BP recheck.  Monitoring at home  Gabapentin renewed.  Caution sedation  Counseled on healthy lifestyle choices, including diet (rich in fruits, vegetables and lean meats and low in salt and simple carbohydrates) and exercise (at least 30 minutes of moderate physical activity daily).  Patient to follow up 3 months  Karn Derk M. Nadine Counts, DO

## 2023-07-15 NOTE — Patient Instructions (Signed)

## 2023-07-16 ENCOUNTER — Encounter: Payer: Self-pay | Admitting: Family Medicine

## 2023-07-16 LAB — CBC
Hematocrit: 39.5 % (ref 34.0–46.6)
Hemoglobin: 12.8 g/dL (ref 11.1–15.9)
MCH: 30.8 pg (ref 26.6–33.0)
MCHC: 32.4 g/dL (ref 31.5–35.7)
MCV: 95 fL (ref 79–97)
Platelets: 204 10*3/uL (ref 150–450)
RBC: 4.15 x10E6/uL (ref 3.77–5.28)
RDW: 12.7 % (ref 11.7–15.4)
WBC: 7 10*3/uL (ref 3.4–10.8)

## 2023-07-16 LAB — LIPID PANEL
Chol/HDL Ratio: 2.2 ratio (ref 0.0–4.4)
Cholesterol, Total: 109 mg/dL (ref 100–199)
HDL: 50 mg/dL (ref >39)
LDL Chol Calc (NIH): 38 mg/dL (ref 0–99)
Triglycerides: 115 mg/dL (ref 0–149)
VLDL Cholesterol Cal: 21 mg/dL (ref 5–40)

## 2023-07-16 LAB — CMP14+EGFR
ALT: 15 IU/L (ref 0–32)
AST: 19 IU/L (ref 0–40)
Albumin: 3.9 g/dL (ref 3.8–4.8)
Alkaline Phosphatase: 157 IU/L — ABNORMAL HIGH (ref 44–121)
BUN/Creatinine Ratio: 21 (ref 12–28)
BUN: 15 mg/dL (ref 8–27)
Bilirubin Total: 0.7 mg/dL (ref 0.0–1.2)
CO2: 22 mmol/L (ref 20–29)
Calcium: 9.2 mg/dL (ref 8.7–10.3)
Chloride: 103 mmol/L (ref 96–106)
Creatinine, Ser: 0.73 mg/dL (ref 0.57–1.00)
Globulin, Total: 2.4 g/dL (ref 1.5–4.5)
Glucose: 105 mg/dL — ABNORMAL HIGH (ref 70–99)
Potassium: 3.9 mmol/L (ref 3.5–5.2)
Sodium: 143 mmol/L (ref 134–144)
Total Protein: 6.3 g/dL (ref 6.0–8.5)
eGFR: 86 mL/min/{1.73_m2} (ref >59)

## 2023-07-16 LAB — TSH: TSH: 3.91 u[IU]/mL (ref 0.450–4.500)

## 2023-07-17 ENCOUNTER — Telehealth: Payer: Self-pay | Admitting: Family Medicine

## 2023-07-17 LAB — SPECIMEN STATUS REPORT

## 2023-07-17 LAB — HGB A1C W/O EAG: Hgb A1c MFr Bld: 6.1 % — ABNORMAL HIGH (ref 4.8–5.6)

## 2023-07-17 NOTE — Telephone Encounter (Signed)
 Copied from CRM [id number]. Topic: Clinical - Lab/Test Results >> Jul 16, 2023  4:59 PM Louie Casa B wrote: Reason for CRM: patient has question about lab results please call patient back [phone]

## 2023-07-17 NOTE — Telephone Encounter (Signed)
 Pt r/c.

## 2023-07-17 NOTE — Telephone Encounter (Signed)
 Left vm for cb

## 2023-07-18 ENCOUNTER — Telehealth: Payer: Self-pay | Admitting: Family Medicine

## 2023-07-18 ENCOUNTER — Encounter: Payer: Medicare Other | Admitting: Family Medicine

## 2023-07-18 NOTE — Telephone Encounter (Signed)
 Copied from CRM [id number]. Topic: Clinical - Lab/Test Results >> Jul 18, 2023 10:20 AM Carlatta H wrote: Reason for CRM: Patient would like to call back with lab results

## 2023-07-19 NOTE — Telephone Encounter (Signed)
 Pt.notified

## 2023-08-28 ENCOUNTER — Ambulatory Visit: Payer: Self-pay

## 2023-08-28 NOTE — Telephone Encounter (Addendum)
  1st attempt, message left to return call.   Message from [place] E sent at 08/28/2023  2:19 PM EDT  Summary: BP concerns   Copied From CRM [phone]. Reason for Triage: Pt's blood pressure has been higher than normal, not red word range, but higher.  Best contact: [phone]

## 2023-08-28 NOTE — Telephone Encounter (Signed)
  Chief Complaint: High blood pressure Symptoms: denies Frequency: monitoring Pertinent Negatives: Patient denies all symptoms  Disposition: [] ED /[] Urgent Care (no appt availability in office) / [] Appointment(In office/virtual)/ []  [facility]/ [] Home Care/ [] Refused Recommended Disposition /[] [facility]/ []  Follow-up with PCP Additional Notes:  Calling for high blood pressure. She has been monitoring daily since August per pcp advice.  She reviewed her pressures and noticed it is trending up. Having a few readings in 140's/80's, most recent 146/85, baseline 130's/70's. Has follow up for scheduled in May for bladder. She is wondering if its ok for her follow up with elevated blood pressure at appointment in May or if she should come in earlier. She has reduced her salt and carb intake, eating fresh fruits and veggies, low fat protein. She is only available for appointments on Monday and Thursdays. Reviewed PCP schedule, no dates available that patient can attend prior to scheduled follow up. Please review and advice if you would like Virginia Nash scheduled prior to 10/15/23 and/or your recommendations.  Reason for Disposition  [1] Systolic BP  >= 130 OR Diastolic >= 80 AND [2] not taking BP medications  Protocols used: Blood Pressure - High-A-AH

## 2023-08-28 NOTE — Telephone Encounter (Signed)
 Please put her in my 230 SD slot tomorrow 4/10. I'll actually be here this thursday

## 2023-08-29 ENCOUNTER — Encounter: Payer: Self-pay | Admitting: Family Medicine

## 2023-08-29 ENCOUNTER — Ambulatory Visit: Admitting: Family Medicine

## 2023-08-29 NOTE — Progress Notes (Deleted)
 Subjective: CC:*** PCP: Raliegh Ip, DO UJW:[id number] C Holness is a 78 y.o. female presenting to clinic today for:  1. ***   ROS: Per HPI  Allergies  Allergen Reactions   Asparagus    Codeine Nausea Only   Other     Peas - rash Asparagus - rash Celery - rash   Peanut Oil Rash and Other (See Comments)    Congestion with nasal scabs    Past Medical History:  Diagnosis Date   Allergy    Arthritis    Asthma    as child   Biliary dyskinesia    Blood transfusion without reported diagnosis    Cancer (HCC)    Cataract    Collagen vascular disease (HCC)     Current Outpatient Medications:    albuterol (VENTOLIN HFA) 108 (90 Base) MCG/ACT inhaler, Inhale 2 puffs into the lungs every 4 (four) hours as needed for wheezing or shortness of breath., Disp: 18 g, Rfl: 0   atorvastatin (LIPITOR) 20 MG tablet, Take 1 tablet (20 mg total) by mouth daily., Disp: 90 tablet, Rfl: 4   Calcium Carbonate-Vit D-Min (CALCIUM 1200 PO), Take by mouth., Disp: , Rfl:    Cholecalciferol (VITAMIN D3) 25 MCG (1000 UT) CAPS, Take by mouth., Disp: , Rfl:    gabapentin (NEURONTIN) 100 MG capsule, Take 1 capsule (100 mg total) by mouth 2 (two) times daily as needed (nerve pain)., Disp: 180 capsule, Rfl: 3   loratadine (CLARITIN) 10 MG tablet, Take 10 mg by mouth daily., Disp: , Rfl:    meloxicam (MOBIC) 15 MG tablet, Take 15 mg by mouth daily., Disp: , Rfl:    omeprazole (PRILOSEC) 20 MG capsule, Take 1 capsule by mouth once daily, Disp: 90 capsule, Rfl: 0   oxybutynin (DITROPAN XL) 5 MG 24 hr tablet, Take 1 tablet (5 mg total) by mouth at bedtime., Disp: 90 tablet, Rfl: 3 Social History   Socioeconomic History   Marital status: Widowed    Spouse name: Mellody Dance   Number of children: 3   Years of education: 12th Grade   Highest education level: High school graduate  Occupational History   Occupation: Retired  Tobacco Use   Smoking status: Never   Smokeless tobacco: Never  Vaping Use    Vaping status: Never Used  Substance and Sexual Activity   Alcohol use: No   Drug use: No   Sexual activity: Yes  Other Topics Concern   Not on file  Social History Narrative   Retired. 3 grown children. Enjoys sewing and crafting.    Husband passed away around 02-29-2020   She doesn't drive, but her children drive her and get her anything she needs   Social Drivers of Corporate investment banker Strain: Low Risk  (04/10/2023)   Overall Financial Resource Strain (CARDIA)    Difficulty of Paying Living Expenses: Not hard at all  Food Insecurity: No Food Insecurity (04/10/2023)   Hunger Vital Sign    Worried About Running Out of Food in the Last Year: Never true    Ran Out of Food in the Last Year: Never true  Transportation Needs: No Transportation Needs (04/10/2023)   PRAPARE - Administrator, Civil Service (Medical): No    Lack of Transportation (Non-Medical): No  Physical Activity: Insufficiently Active (04/10/2023)   Exercise Vital Sign    Days of Exercise per Week: 7 days    Minutes of Exercise per Session: 20 min  Stress: No Stress  Concern Present (04/10/2023)   [employer] of Occupational Health - Occupational Stress Questionnaire    Feeling of Stress : Not at all  Social Connections: Moderately Isolated (04/10/2023)   Social Connection and Isolation Panel [NHANES]    Frequency of Communication with Friends and Family: More than three times a week    Frequency of Social Gatherings with Friends and Family: More than three times a week    Attends Religious Services: More than 4 times per year    Active Member of [employer] or Organizations: No    Attends Banker Meetings: Never    Marital Status: Widowed  Intimate Partner Violence: Not At Risk (04/10/2023)   Humiliation, Afraid, Rape, and Kick questionnaire    Fear of Current or Ex-Partner: No    Emotionally Abused: No    Physically Abused: No    Sexually Abused: No   Family History  Problem  Relation Age of Onset   Arthritis Mother    Cancer Mother    Cancer Father    Hearing loss Sister    Stroke Maternal Grandmother    Kidney disease Paternal Grandmother    Thyroid disease Daughter     Objective: Office vital signs reviewed. There were no vitals taken for this visit.  Physical Examination:  General: Awake, alert, *** nourished, No acute distress HEENT: Normal    Neck: No masses palpated. No lymphadenopathy    Ears: Tympanic membranes intact, normal light reflex, no erythema, no bulging    Eyes: PERRLA, extraocular membranes intact, sclera ***    Nose: nasal turbinates moist, *** nasal discharge    Throat: moist mucus membranes, no erythema, *** tonsillar exudate.  Airway is patent Cardio: regular rate and rhythm, S1S2 heard, no murmurs appreciated Pulm: clear to auscultation bilaterally, no wheezes, rhonchi or rales; normal work of breathing on room air GI: soft, non-tender, non-distended, bowel sounds present x4, no hepatomegaly, no splenomegaly, no masses GU: external vaginal tissue ***, cervix ***, *** punctate lesions on cervix appreciated, *** discharge from cervical os, *** bleeding, *** cervical motion tenderness, *** abdominal/ adnexal masses Extremities: warm, well perfused, No edema, cyanosis or clubbing; +*** pulses bilaterally MSK: *** gait and *** station Skin: dry; intact; no rashes or lesions Neuro: *** Strength and light touch sensation grossly intact, *** DTRs ***/4  Assessment/ Plan: 78 y.o. female   Elevated blood pressure reading  ***   Odeth Bry Hulen Skains, DO [facility] [phone]

## 2023-09-04 DIAGNOSIS — H1132 Conjunctival hemorrhage, left eye: Secondary | ICD-10-CM | POA: Diagnosis not present

## 2023-09-05 ENCOUNTER — Telehealth: Payer: Self-pay

## 2023-09-05 NOTE — Telephone Encounter (Signed)
 Copied from CRM [id number]. Topic: Appointments - Scheduling Inquiry for Clinic >> Sep 05, 2023  3:39 PM Hobson Luna F wrote: Reason for CRM: Patient is calling in because she received a letter for a missed appointment on 08/29/23. Patient says she was unaware of the appointment. Patient says she doesn't have access to MyChart and cannot get in MyChart, so she would need to be contacted via phone or mail.

## 2023-09-09 NOTE — Telephone Encounter (Signed)
Spoke with pt regarding this issue.

## 2023-09-09 NOTE — Telephone Encounter (Signed)
 It was just to see if the bladder med was working well. If so, let me know and I will add refills

## 2023-09-10 ENCOUNTER — Ambulatory Visit: Payer: Self-pay

## 2023-09-10 NOTE — Telephone Encounter (Signed)
 Ok to take 650mg  of Tylenol  with 400mg  of ibuprofen up to 3 times daily as needed for pain. Take with food.

## 2023-09-10 NOTE — Telephone Encounter (Signed)
 Pt has been notified , advised her to reach back out ig symptoms worsen or don't improve

## 2023-09-10 NOTE — Telephone Encounter (Signed)
 Copied from CRM [id number]. Topic: Clinical - Red Word Triage >> Sep 10, 2023 12:43 PM Turkey B wrote: [facility] that prompted transfer to Nurse Triage: pt has severe pain in back and hip, wants to know how to take tylenol  and ibuprofen together   Patient called to state she is having arthritis pain and wanted to know how to safely take Tylenol  and Ibuprofen together. Patient advised on dosing instructions for the medications. Patient had no further questions or concerns at this time. Patient will call back if her pain does not improve and she feels the need to be seen in the office.    Reason for Disposition  Caller has medicine question, adult has minor symptoms, caller declines triage, AND triager answers question  Answer Assessment - Initial Assessment Questions 1. NAME of MEDICINE: "What medicine(s) are you calling about?"     Tylenol  and Ibuprofen  2. QUESTION: "What is your question?" (e.g., double dose of medicine, side effect)     How to take together  Protocols used: Medication Question Call-A-AH

## 2023-09-26 DIAGNOSIS — M1711 Unilateral primary osteoarthritis, right knee: Secondary | ICD-10-CM | POA: Diagnosis not present

## 2023-10-06 ENCOUNTER — Other Ambulatory Visit: Payer: Self-pay | Admitting: Family Medicine

## 2023-10-15 ENCOUNTER — Ambulatory Visit (INDEPENDENT_AMBULATORY_CARE_PROVIDER_SITE_OTHER): Payer: Medicare Other | Admitting: Family Medicine

## 2023-10-15 ENCOUNTER — Encounter: Payer: Self-pay | Admitting: Family Medicine

## 2023-10-15 VITALS — BP 140/78 | HR 68 | Temp 98.3°F | Ht 61.0 in | Wt 194.0 lb

## 2023-10-15 DIAGNOSIS — Z91038 Other insect allergy status: Secondary | ICD-10-CM | POA: Diagnosis not present

## 2023-10-15 DIAGNOSIS — R09A2 Foreign body sensation, throat: Secondary | ICD-10-CM | POA: Diagnosis not present

## 2023-10-15 DIAGNOSIS — R499 Unspecified voice and resonance disorder: Secondary | ICD-10-CM | POA: Diagnosis not present

## 2023-10-15 DIAGNOSIS — N3281 Overactive bladder: Secondary | ICD-10-CM

## 2023-10-15 MED ORDER — OMEPRAZOLE 20 MG PO CPDR: 20.0000 mg | DELAYED_RELEASE_CAPSULE | Freq: Every day | ORAL | 4 refills | Status: DC

## 2023-10-15 NOTE — Progress Notes (Signed)
 Subjective: CC: Overactive bladder PCP: Eliodoro Guerin, DO [place] Nash is a 78 y.o. female presenting to clinic today for:  1.  Overactive bladder Patient reports that the Ditropan  is working well to control her overactive bladder.  She feels like she is emptying more and going less frequently.  She does admit to some [place] but otherwise she is tolerated the medicine without difficulty  2.  Hoarseness, cough She has had some intermittent coughing without any etiology.  It was worse during allergy season but symptoms seem to be getting milder though they are persistent.  She reports a globus sensation where she feels like she just cannot get something out.  Sometimes if she talks for a long time she becomes hoarse.  She is treated with a PPI and is consistent with this.  Denies any postnasal drainage etc.  No hemoptysis.  No unplanned weight loss.  3.  Insect bite She has a couple of insect bites along her waistline.  She reports they were diffusely itchy but seem to be getting better.  She just wanted to make sure nothing looked tick-like   ROS: Per HPI  Allergies  Allergen Reactions   Asparagus    Codeine Nausea Only   Other     Peas - rash Asparagus - rash Celery - rash   Peanut Oil Rash and Other (See Comments)    Congestion with nasal scabs    Past Medical History:  Diagnosis Date   Allergy    Arthritis    Asthma    as child   Biliary dyskinesia    Blood transfusion without reported diagnosis    Cancer (HCC)    Cataract    Collagen vascular disease (HCC)     Current Outpatient Medications:    albuterol  (VENTOLIN  HFA) 108 (90 Base) MCG/ACT inhaler, Inhale 2 puffs into the lungs every 4 (four) hours as needed for wheezing or shortness of breath., Disp: 18 g, Rfl: 0   atorvastatin  (LIPITOR) 20 MG tablet, Take 1 tablet (20 mg total) by mouth daily., Disp: 90 tablet, Rfl: 4   Calcium  Carbonate-Vit D-Min (CALCIUM  1200 PO), Take by mouth., Disp: ,  Rfl:    Cholecalciferol (VITAMIN D3) 25 MCG (1000 UT) CAPS, Take by mouth., Disp: , Rfl:    gabapentin  (NEURONTIN ) 100 MG capsule, Take 1 capsule (100 mg total) by mouth 2 (two) times daily as needed (nerve pain)., Disp: 180 capsule, Rfl: 3   loratadine (CLARITIN) 10 MG tablet, Take 10 mg by mouth daily., Disp: , Rfl:    meloxicam (MOBIC) 15 MG tablet, Take 15 mg by mouth daily., Disp: , Rfl:    omeprazole  (PRILOSEC) 20 MG capsule, Take 1 capsule by mouth once daily, Disp: 90 capsule, Rfl: 0   oxybutynin  (DITROPAN  XL) 5 MG 24 hr tablet, Take 1 tablet (5 mg total) by mouth at bedtime., Disp: 90 tablet, Rfl: 3 Social History   Socioeconomic History   Marital status: Widowed    Spouse name: Sylvester Evert   Number of children: 3   Years of education: 12th Grade   Highest education level: High school graduate  Occupational History   Occupation: Retired  Tobacco Use   Smoking status: Never   Smokeless tobacco: Never  Vaping Use   Vaping status: Never Used  Substance and Sexual Activity   Alcohol use: No   Drug use: No   Sexual activity: Yes  Other Topics Concern   Not on file  Social History Narrative  Retired. 3 grown children. Enjoys sewing and crafting.    Husband passed away around 02-08-20   She doesn't drive, but her children drive her and get her anything she needs   Social Drivers of Corporate investment banker Strain: Low Risk  (04/10/2023)   Overall Financial Resource Strain (CARDIA)    Difficulty of Paying Living Expenses: Not hard at all  Food Insecurity: No Food Insecurity (04/10/2023)   Hunger Vital Sign    Worried About Running Out of Food in the Last Year: Never true    Ran Out of Food in the Last Year: Never true  Transportation Needs: No Transportation Needs (04/10/2023)   PRAPARE - Administrator, Civil Service (Medical): No    Lack of Transportation (Non-Medical): No  Physical Activity: Insufficiently Active (04/10/2023)   Exercise Vital Sign    Days of  Exercise per Week: 7 days    Minutes of Exercise per Session: 20 min  Stress: No Stress Concern Present (04/10/2023)   [employer] of Occupational Health - Occupational Stress Questionnaire    Feeling of Stress : Not at all  Social Connections: Moderately Isolated (04/10/2023)   Social Connection and Isolation Panel [NHANES]    Frequency of Communication with Friends and Family: More than three times a week    Frequency of Social Gatherings with Friends and Family: More than three times a week    Attends Religious Services: More than 4 times per year    Active Member of [employer] or Organizations: No    Attends Banker Meetings: Never    Marital Status: Widowed  Intimate Partner Violence: Not At Risk (04/10/2023)   Humiliation, Afraid, Rape, and Kick questionnaire    Fear of Current or Ex-Partner: No    Emotionally Abused: No    Physically Abused: No    Sexually Abused: No   Family History  Problem Relation Age of Onset   Arthritis Mother    Cancer Mother    Cancer Father    Hearing loss Sister    Stroke Maternal Grandmother    Kidney disease Paternal Grandmother    Thyroid  disease Daughter     Objective: Office vital signs reviewed. BP (!) 140/78   Pulse 68   Temp 98.3 F (36.8 C)   Ht 5\' 1"  (1.549 m)   Wt 194 lb (88 kg)   SpO2 93%   BMI 36.66 kg/m   Physical Examination:  General: Awake, alert, nontoxic female, No acute distress HEENT: Sclera white.  Moist mucous membranes. Cardio: regular rate and rhythm, S1S2 heard, no murmurs appreciated Pulm: clear to auscultation bilaterally, no wheezes, rhonchi or rales; normal work of breathing on room air Skin: Minimally erythematous wheals appreciated along the waist where the there are insect bite  Assessment/ Plan: 78 y.o. female   Overactive bladder  Hoarseness or changing voice - Plan: Ambulatory referral to ENT  Globus sensation - Plan: Ambulatory referral to ENT  Allergic reaction to insect  bite  Continue Ditropan .  Hydrate.  Referral to ENT for further evaluation of vocal cord given reports of intermittent hoarseness, globus sensation and cough.  She is treated with PPI so I'd be surprised if this was related to uncontrolled GERD.  However it is something to continue considering  Encouraged use of topical Benadryl or cortisone for allergic reaction to insect bites.  Does not appear to be consistent with any type of bedbug.  No evidence of secondary infection or complication   Horace Lukas  Bambi Bonine, DO [facility] [phone]

## 2023-10-18 ENCOUNTER — Encounter: Payer: Self-pay | Admitting: *Deleted

## 2023-10-21 DIAGNOSIS — H40013 Open angle with borderline findings, low risk, bilateral: Secondary | ICD-10-CM | POA: Diagnosis not present

## 2023-12-20 ENCOUNTER — Telehealth: Payer: Self-pay

## 2023-12-20 NOTE — Telephone Encounter (Signed)
 Copied from CRM [id number]. Topic: General - Other >> Dec 20, 2023  3:53 PM Rosaria E wrote: Reason for CRM: Pt called to report that she missed a mychart message but is not sure what it is. Please advise   Best contact: [phone]

## 2023-12-23 ENCOUNTER — Telehealth: Payer: Self-pay | Admitting: *Deleted

## 2023-12-23 ENCOUNTER — Ambulatory Visit: Payer: Self-pay

## 2023-12-23 ENCOUNTER — Other Ambulatory Visit: Payer: Self-pay

## 2023-12-23 DIAGNOSIS — Z1231 Encounter for screening mammogram for malignant neoplasm of breast: Secondary | ICD-10-CM

## 2023-12-23 NOTE — Telephone Encounter (Signed)
 Returned patient call about a missed [employer]. Patient voicemail full, unable to leave message. I do not see where anyone reached out to the patient on mychart.

## 2023-12-23 NOTE — Telephone Encounter (Signed)
 FYI Only or Action Required?: FYI only for provider.  Patient was last seen in primary care on 10/15/2023 by Jolinda Norene HERO, DO.  Called Nurse Triage reporting Dizziness.  Symptoms began several weeks ago.  Interventions attempted: Nothing.  Symptoms are: gradually worsening.  Triage Disposition: See PCP When Office is Open (Within 3 Days)  Patient/caregiver understands and will follow disposition?: Yes Copied from CRM #[id number]. Topic: Clinical - Red Word Triage >> Dec 23, 2023  2:20 PM Willma R wrote: Red Word that prompted transfer to Nurse Triage: Patient has been experiencing dizziness, fatigue, and a lingering cough. had symptoms for awhile but they are getting worse. Reason for Disposition  [1] MODERATE dizziness (e.g., interferes with normal activities) AND [2] has been evaluated by doctor (or NP/PA) for this  Answer Assessment - Initial Assessment Questions 1. DESCRIPTION: Describe your dizziness.     Not really dizzy, feels light headed when I get up  2. LIGHTHEADED: Do you feel lightheaded? (e.g., somewhat faint, woozy, weak upon standing)     Weak when standing or changing position  3. VERTIGO: Do you feel like either you or the room is spinning or tilting? (i.e., vertigo)     Room spinning  4. SEVERITY: How bad is it?  Do you feel like you are going to faint? Can you stand and walk?     Still able to stand and walk.   5. ONSET:  When did the dizziness begin?     Unsure of when symptoms started exactly  6. AGGRAVATING FACTORS: Does anything make it worse? (e.g., standing, change in head position)     Standing or changes in head position  7. HEART RATE: Can you tell me your heart rate? How many beats in 15 seconds?  (Note: Not all patients can do this.)       *No Answer* 8. CAUSE: What do you think is causing the dizziness? (e.g., decreased fluids or food, diarrhea, emotional distress, heat exposure, new medicine, sudden standing,  vomiting; unknown)     New medications  9. RECURRENT SYMPTOM: Have you had dizziness before? If Yes, ask: When was the last time? What happened that time?     No  10. OTHER SYMPTOMS: Do you have any other symptoms? (e.g., fever, chest pain, vomiting, diarrhea, bleeding)       Lingering cough  11. PREGNANCY: Is there any chance you are pregnant? When was your last menstrual period?       *No Answer*  Protocols used: Dizziness - Lightheadedness-A-AH

## 2023-12-23 NOTE — Telephone Encounter (Signed)
 Copied from CRM #[id number]. Topic: Clinical - Request for Lab/Test Order >> Dec 23, 2023 10:28 AM Cherylann RAMAN wrote: Reason for CRM: Patient is requesting an order for a mammogram as it is due for another one. The last one was on 02/11/23. Patient would also like to add a Dexa Scan at the time of her next appt on 04/21/24 as the last one was completed on 01/01/22  Please contact patient at [phone] for further details.

## 2023-12-23 NOTE — Telephone Encounter (Signed)
 Pt has appt

## 2023-12-24 NOTE — Telephone Encounter (Signed)
 Patient notified that order for mammogram was place and to call AP for appt.  Patient is coming in for appt tomorrow may get DEXA then

## 2023-12-25 ENCOUNTER — Ambulatory Visit (INDEPENDENT_AMBULATORY_CARE_PROVIDER_SITE_OTHER): Admitting: Family Medicine

## 2023-12-25 ENCOUNTER — Encounter: Payer: Self-pay | Admitting: Family Medicine

## 2023-12-25 VITALS — BP 138/83 | HR 75 | Ht 61.0 in

## 2023-12-25 DIAGNOSIS — R42 Dizziness and giddiness: Secondary | ICD-10-CM

## 2023-12-25 MED ORDER — MECLIZINE HCL 25 MG PO TABS: 25.0000 mg | ORAL_TABLET | Freq: Three times a day (TID) | ORAL | 0 refills | Status: AC | PRN

## 2023-12-25 NOTE — Progress Notes (Signed)
 BP 138/83   Pulse 75   Ht 5' 1 (1.549 m)   SpO2 95%   BMI 36.66 kg/m    Subjective:   Patient ID: Virginia Nash, female    DOB: [birth year], 78 y.o.   MRN: [id number]  HPI: Virginia Nash is a 78 y.o. female presenting on 12/25/2023 for Dizziness   Discussed the use of AI scribe software for clinical note transcription with the patient, who gave verbal consent to proceed.  History of Present Illness   Virginia Nash is a 78 year old female who presents with dizziness and vertigo.  She has been experiencing dizziness for the past few weeks, described as a sensation of the room spinning, occurring even when sitting and turning her head. Initially, she felt off balance when standing up, but now the dizziness occurs more frequently when she turns her head while sitting. The dizziness worsened when she was taking gabapentin  twice a day, leading her to reduce the dosage to once at night. Despite this adjustment, she continues to experience dizziness.  She takes gabapentin  for facial pain, initially only at night due to stomach upset. However, the pain persisted, prompting her to take it during the day as well, which exacerbated her dizziness. She has since reverted to taking it only at night. She also takes Ditropan , a new medication since February, which she believes has helped with her bladder issues, reducing the frequency of urination.  She reports feeling constantly tired and experiencing shortness of breath. She mentions a history of lower back pain around her hips, which she suspects might be arthritis or bursitis, as suggested by her daughter. She has experienced sciatic pain, which she describes as worse than previous knee pain.  She has a history of esophageal issues, having had her esophagus stretched years ago due to ulcers. She experiences a sensation of something in her throat, which she associates with coughing and a feeling of choking. No current stomach pain and she is  careful with her diet to avoid certain foods.  She experiences tinnitus, described as ringing in the ears, and has difficulty sleeping, often needing background noise to fall asleep. Once asleep, she stays asleep. She also reports a dry mouth, which she attributes to her medications and possibly allergies.          Relevant past medical, surgical, family and social history reviewed and updated as indicated. Interim medical history since our last visit reviewed. Allergies and medications reviewed and updated.  Review of Systems  Constitutional:  Negative for chills and fever.  HENT:  Positive for congestion.   Eyes:  Negative for visual disturbance.  Respiratory:  Positive for cough. Negative for chest tightness and shortness of breath.   Cardiovascular:  Negative for chest pain and leg swelling.  Genitourinary:  Negative for difficulty urinating and dysuria.  Musculoskeletal:  Negative for back pain and gait problem.  Skin:  Negative for rash.  Neurological:  Positive for dizziness and light-headedness. Negative for weakness and headaches.  Psychiatric/Behavioral:  Negative for agitation and behavioral problems.   All other systems reviewed and are negative.   Per HPI unless specifically indicated above   Allergies as of 12/25/2023       Reactions   Asparagus    Codeine Nausea Only   Other    Peas - rash Asparagus - rash Celery - rash   Peanut Oil Rash, Other (See Comments)   Congestion with nasal scabs  Medication List        Accurate as of December 25, 2023 11:48 AM. If you have any questions, ask your nurse or doctor.          albuterol  108 (90 Base) MCG/ACT inhaler Commonly known as: VENTOLIN  HFA Inhale 2 puffs into the lungs every 4 (four) hours as needed for wheezing or shortness of breath.   atorvastatin  20 MG tablet Commonly known as: LIPITOR Take 1 tablet (20 mg total) by mouth daily.   CALCIUM  1200 PO Take by mouth.   gabapentin  100 MG  capsule Commonly known as: Neurontin  Take 1 capsule (100 mg total) by mouth 2 (two) times daily as needed (nerve pain).   loratadine 10 MG tablet Commonly known as: CLARITIN Take 10 mg by mouth daily.   meclizine  25 MG tablet Commonly known as: ANTIVERT  Take 1 tablet (25 mg total) by mouth 3 (three) times daily as needed for dizziness. Started by: Fonda LABOR Dorman Calderwood   meloxicam 15 MG tablet Commonly known as: MOBIC Take 15 mg by mouth daily.   omeprazole  20 MG capsule Commonly known as: PRILOSEC Take 1 capsule (20 mg total) by mouth daily.   oxybutynin  5 MG 24 hr tablet Commonly known as: Ditropan  XL Take 1 tablet (5 mg total) by mouth at bedtime.   Vitamin D3 25 MCG (1000 UT) Caps Take by mouth.         Objective:   BP 138/83   Pulse 75   Ht 5' 1 (1.549 m)   SpO2 95%   BMI 36.66 kg/m   Wt Readings from Last 3 Encounters:  10/15/23 194 lb (88 kg)  07/15/23 196 lb 9.6 oz (89.2 kg)  04/10/23 195 lb (88.5 kg)    Physical Exam HENT:     Right Ear: Tympanic membrane, ear canal and external ear normal.     Left Ear: Tympanic membrane, ear canal and external ear normal.     Mouth/Throat:     Mouth: Mucous membranes are moist.     Pharynx: Oropharynx is clear. No oropharyngeal exudate or posterior oropharyngeal erythema.  Abdominal:     General: Abdomen is flat. Bowel sounds are normal. There is no distension.     Palpations: Abdomen is soft.     Tenderness: There is no abdominal tenderness. There is no guarding or rebound.    Physical Exam   NECK: Thyroid  normal, no masses palpated. CHEST: Lungs clear to auscultation bilaterally. CARDIOVASCULAR: Regular heart sounds, no murmurs.         Assessment & Plan:   Problem List Items Addressed This Visit   None Visit Diagnoses       Vertigo    -  Primary   Relevant Medications   meclizine  (ANTIVERT ) 25 MG tablet     Orthostatic dizziness            Assessment and Plan    Benign Paroxysmal  Positional Vertigo (BPPV) Intermittent vertigo with room spinning sensation, not linked to gabapentin  or Ditropan .  Will give meclizine  that she can use as needed and also gave her handout for home exercises for vertigo that she can do to alleviate it.  Chronic Neuropathic Facial Pain Chronic facial pain previously managed with gabapentin , which may cause dizziness and fatigue. She prefers discontinuation due to side effects. - Discontinue gabapentin . - Consider alternative pain management options if needed.  Gastroesophageal Reflux Disease (GERD) Chronic cough possibly related to GERD with esophageal dilation and ulcers. Symptoms include throat sensation and  dry mouth, possibly worsened by bladder medication. - Increase omeprazole  to twice daily for two weeks.  Overactive Bladder Symptoms improved with Ditropan , which she prefers to continue despite potential dizziness. - Continue Ditropan .          Follow up plan: Return if symptoms worsen or fail to improve.  Counseling provided for all of the vaccine components No orders of the defined types were placed in this encounter.   Fonda Levins, MD [facility] 12/25/2023, 11:48 AM

## 2024-01-07 DIAGNOSIS — M542 Cervicalgia: Secondary | ICD-10-CM | POA: Diagnosis not present

## 2024-01-07 DIAGNOSIS — M25511 Pain in right shoulder: Secondary | ICD-10-CM | POA: Diagnosis not present

## 2024-01-21 DIAGNOSIS — M25511 Pain in right shoulder: Secondary | ICD-10-CM | POA: Diagnosis not present

## 2024-01-21 DIAGNOSIS — M542 Cervicalgia: Secondary | ICD-10-CM | POA: Diagnosis not present

## 2024-01-27 ENCOUNTER — Other Ambulatory Visit (HOSPITAL_COMMUNITY): Payer: Self-pay | Admitting: Family Medicine

## 2024-01-27 DIAGNOSIS — Z1231 Encounter for screening mammogram for malignant neoplasm of breast: Secondary | ICD-10-CM

## 2024-01-28 DIAGNOSIS — M542 Cervicalgia: Secondary | ICD-10-CM | POA: Diagnosis not present

## 2024-01-28 DIAGNOSIS — M25511 Pain in right shoulder: Secondary | ICD-10-CM | POA: Diagnosis not present

## 2024-01-31 DIAGNOSIS — M25511 Pain in right shoulder: Secondary | ICD-10-CM | POA: Diagnosis not present

## 2024-01-31 DIAGNOSIS — M542 Cervicalgia: Secondary | ICD-10-CM | POA: Diagnosis not present

## 2024-02-04 DIAGNOSIS — M542 Cervicalgia: Secondary | ICD-10-CM | POA: Diagnosis not present

## 2024-02-04 DIAGNOSIS — M25511 Pain in right shoulder: Secondary | ICD-10-CM | POA: Diagnosis not present

## 2024-02-07 DIAGNOSIS — M25511 Pain in right shoulder: Secondary | ICD-10-CM | POA: Diagnosis not present

## 2024-02-07 DIAGNOSIS — M542 Cervicalgia: Secondary | ICD-10-CM | POA: Diagnosis not present

## 2024-02-10 DIAGNOSIS — M25511 Pain in right shoulder: Secondary | ICD-10-CM | POA: Diagnosis not present

## 2024-02-10 DIAGNOSIS — M542 Cervicalgia: Secondary | ICD-10-CM | POA: Diagnosis not present

## 2024-02-12 DIAGNOSIS — M542 Cervicalgia: Secondary | ICD-10-CM | POA: Diagnosis not present

## 2024-02-12 DIAGNOSIS — M25511 Pain in right shoulder: Secondary | ICD-10-CM | POA: Diagnosis not present

## 2024-02-17 DIAGNOSIS — M542 Cervicalgia: Secondary | ICD-10-CM | POA: Diagnosis not present

## 2024-02-17 DIAGNOSIS — M25511 Pain in right shoulder: Secondary | ICD-10-CM | POA: Diagnosis not present

## 2024-02-19 DIAGNOSIS — M25511 Pain in right shoulder: Secondary | ICD-10-CM | POA: Diagnosis not present

## 2024-02-19 DIAGNOSIS — M542 Cervicalgia: Secondary | ICD-10-CM | POA: Diagnosis not present

## 2024-02-24 DIAGNOSIS — M25511 Pain in right shoulder: Secondary | ICD-10-CM | POA: Diagnosis not present

## 2024-02-24 DIAGNOSIS — M542 Cervicalgia: Secondary | ICD-10-CM | POA: Diagnosis not present

## 2024-02-25 DIAGNOSIS — M542 Cervicalgia: Secondary | ICD-10-CM | POA: Diagnosis not present

## 2024-02-25 DIAGNOSIS — M5412 Radiculopathy, cervical region: Secondary | ICD-10-CM | POA: Diagnosis not present

## 2024-02-25 DIAGNOSIS — M25511 Pain in right shoulder: Secondary | ICD-10-CM | POA: Diagnosis not present

## 2024-02-27 ENCOUNTER — Telehealth: Payer: Self-pay | Admitting: Family Medicine

## 2024-02-27 NOTE — Telephone Encounter (Signed)
 Copied from CRM [id number]. Topic: General - Other >> Feb 27, 2024  3:03 PM Donna BRAVO wrote: Reason for CRM: patient returning missed call unsure of which provider called, and  Patient would like to know if she can have a done density scan at the appt 04/21/24    Please call patient if this is possible

## 2024-03-02 NOTE — Telephone Encounter (Signed)
 Called and scheduled appt for DEXA

## 2024-03-19 DIAGNOSIS — M47812 Spondylosis without myelopathy or radiculopathy, cervical region: Secondary | ICD-10-CM | POA: Diagnosis not present

## 2024-03-19 DIAGNOSIS — M4803 Spinal stenosis, cervicothoracic region: Secondary | ICD-10-CM | POA: Diagnosis not present

## 2024-03-19 DIAGNOSIS — M50321 Other cervical disc degeneration at C4-C5 level: Secondary | ICD-10-CM | POA: Diagnosis not present

## 2024-03-19 DIAGNOSIS — M47813 Spondylosis without myelopathy or radiculopathy, cervicothoracic region: Secondary | ICD-10-CM | POA: Diagnosis not present

## 2024-03-19 DIAGNOSIS — M5021 Other cervical disc displacement,  high cervical region: Secondary | ICD-10-CM | POA: Diagnosis not present

## 2024-03-19 DIAGNOSIS — M4802 Spinal stenosis, cervical region: Secondary | ICD-10-CM | POA: Diagnosis not present

## 2024-03-19 DIAGNOSIS — M5031 Other cervical disc degeneration,  high cervical region: Secondary | ICD-10-CM | POA: Diagnosis not present

## 2024-03-19 DIAGNOSIS — M5033 Other cervical disc degeneration, cervicothoracic region: Secondary | ICD-10-CM | POA: Diagnosis not present

## 2024-03-23 ENCOUNTER — Ambulatory Visit (HOSPITAL_COMMUNITY)
Admission: RE | Admit: 2024-03-23 | Discharge: 2024-03-23 | Disposition: A | Discharge status: Home / Self Care | Source: Ambulatory Visit | Attending: Family Medicine | Admitting: Family Medicine

## 2024-03-23 DIAGNOSIS — Z1231 Encounter for screening mammogram for malignant neoplasm of breast: Secondary | ICD-10-CM | POA: Insufficient documentation

## 2024-03-24 DIAGNOSIS — M4802 Spinal stenosis, cervical region: Secondary | ICD-10-CM | POA: Diagnosis not present

## 2024-04-10 DIAGNOSIS — M5412 Radiculopathy, cervical region: Secondary | ICD-10-CM | POA: Diagnosis not present

## 2024-04-21 ENCOUNTER — Encounter: Payer: Self-pay | Admitting: Family Medicine

## 2024-04-21 ENCOUNTER — Ambulatory Visit

## 2024-04-21 ENCOUNTER — Ambulatory Visit: Payer: Self-pay | Admitting: Family Medicine

## 2024-04-21 ENCOUNTER — Ambulatory Visit: Payer: Self-pay

## 2024-04-21 ENCOUNTER — Other Ambulatory Visit: Payer: Self-pay | Admitting: Family Medicine

## 2024-04-21 VITALS — BP 137/79 | HR 80 | Temp 98.1°F | Ht 61.0 in | Wt 192.1 lb

## 2024-04-21 DIAGNOSIS — R499 Unspecified voice and resonance disorder: Secondary | ICD-10-CM | POA: Diagnosis not present

## 2024-04-21 DIAGNOSIS — R09A2 Foreign body sensation, throat: Secondary | ICD-10-CM | POA: Diagnosis not present

## 2024-04-21 DIAGNOSIS — R059 Cough, unspecified: Secondary | ICD-10-CM

## 2024-04-21 DIAGNOSIS — R5382 Chronic fatigue, unspecified: Secondary | ICD-10-CM | POA: Diagnosis not present

## 2024-04-21 DIAGNOSIS — Z780 Asymptomatic menopausal state: Secondary | ICD-10-CM | POA: Diagnosis not present

## 2024-04-21 DIAGNOSIS — R252 Cramp and spasm: Secondary | ICD-10-CM | POA: Diagnosis not present

## 2024-04-21 DIAGNOSIS — J4520 Mild intermittent asthma, uncomplicated: Secondary | ICD-10-CM

## 2024-04-21 DIAGNOSIS — R0683 Snoring: Secondary | ICD-10-CM

## 2024-04-21 DIAGNOSIS — N3281 Overactive bladder: Secondary | ICD-10-CM

## 2024-04-21 DIAGNOSIS — E7800 Pure hypercholesterolemia, unspecified: Secondary | ICD-10-CM

## 2024-04-21 DIAGNOSIS — Z13820 Encounter for screening for osteoporosis: Secondary | ICD-10-CM

## 2024-04-21 LAB — VERITOR SARS-COV-2 AND FLU A+B
BD Veritor SARS-CoV-2 Ag: NEGATIVE
Influenza A: NEGATIVE
Influenza B: NEGATIVE

## 2024-04-21 MED ORDER — OXYBUTYNIN CHLORIDE ER 5 MG PO TB24: 5.0000 mg | ORAL_TABLET | Freq: Every day | ORAL | 3 refills | Status: AC

## 2024-04-21 MED ORDER — ATORVASTATIN CALCIUM 20 MG PO TABS: 20.0000 mg | ORAL_TABLET | Freq: Every day | ORAL | 4 refills | Status: AC

## 2024-04-21 MED ORDER — BENZONATATE 200 MG PO CAPS: 200.0000 mg | ORAL_CAPSULE | Freq: Three times a day (TID) | ORAL | 0 refills | Status: AC | PRN

## 2024-04-21 MED ORDER — ALBUTEROL SULFATE HFA 108 (90 BASE) MCG/ACT IN AERS: 2.0000 | INHALATION_SPRAY | RESPIRATORY_TRACT | 0 refills | Status: AC | PRN

## 2024-04-21 MED ORDER — OMEPRAZOLE 20 MG PO CPDR: 20.0000 mg | DELAYED_RELEASE_CAPSULE | Freq: Every day | ORAL | 4 refills | Status: AC

## 2024-04-21 MED ORDER — PROMETHAZINE-DM 6.25-15 MG/5ML PO SYRP: 5.0000 mL | ORAL_SOLUTION | Freq: Every evening | ORAL | 0 refills | Status: AC | PRN

## 2024-04-21 NOTE — Patient Instructions (Signed)

## 2024-04-21 NOTE — Progress Notes (Signed)
 Subjective: CC: 59-month checkup PCP: Jolinda Norene HERO, DO Virginia Nash is a 78 y.o. female presenting to clinic today for:  Patient is accompanied today by her daughter.  She notes that she has been having URI symptoms that onset around Thanksgiving.  She reports no overt hypersensitivity of the skin but that her body feels like she is going to get sick constantly now.  She had a flu exposure potentially by her grandson.  She does not report any drainage, sinus congestion etc. but does report dry cough.  Sometimes she wakes up feeling wheezy but denies any overt shortness of breath that is outside of baseline.  She is not taking anything over-the-counter and is not utilizing her albuterol  inhaler.  She thinks it might be expired  She also reports some cramping in bilateral legs right greater than left and sometimes this occurs in her ribs as well.  She is taking magnesium and hydrating well.  She feels tired all the time and upon further discussion her daughter admits that she has very loud snoring.  She has never been evaluated for sleep apnea.  There is a strong family history of thyroid  disease.  She has lost a little weight since her last visit and she is happy about this.  She has discontinued the gabapentin  because it was causing too much dizziness.  She has some intermittent vertigo at baseline but admits that she does not use her meclizine  regularly for this.  She decided to keep the Ditropan  because controlling her overactive bladder was more important to her and so for that seems to be working well.  ROS: Per HPI  Allergies  Allergen Reactions   Asparagus    Codeine Nausea Only   Other     Peas - rash Asparagus - rash Celery - rash   Peanut Oil Rash and Other (See Comments)    Congestion with nasal scabs    Past Medical History:  Diagnosis Date   Allergy    Arthritis    Asthma    as child   Biliary dyskinesia    Blood transfusion without reported diagnosis     Cancer (HCC)    Cataract    Collagen vascular disease     Current Outpatient Medications:    albuterol  (VENTOLIN  HFA) 108 (90 Base) MCG/ACT inhaler, Inhale 2 puffs into the lungs every 4 (four) hours as needed for wheezing or shortness of breath., Disp: 18 g, Rfl: 0   atorvastatin  (LIPITOR) 20 MG tablet, Take 1 tablet (20 mg total) by mouth daily., Disp: 90 tablet, Rfl: 4   Calcium  Carbonate-Vit D-Min (CALCIUM  1200 PO), Take by mouth., Disp: , Rfl:    Cholecalciferol (VITAMIN D3) 25 MCG (1000 UT) CAPS, Take by mouth., Disp: , Rfl:    gabapentin  (NEURONTIN ) 100 MG capsule, Take 1 capsule (100 mg total) by mouth 2 (two) times daily as needed (nerve pain)., Disp: 180 capsule, Rfl: 3   loratadine (CLARITIN) 10 MG tablet, Take 10 mg by mouth daily., Disp: , Rfl:    meclizine  (ANTIVERT ) 25 MG tablet, Take 1 tablet (25 mg total) by mouth 3 (three) times daily as needed for dizziness., Disp: 30 tablet, Rfl: 0   meloxicam (MOBIC) 15 MG tablet, Take 15 mg by mouth daily., Disp: , Rfl:    omeprazole  (PRILOSEC) 20 MG capsule, Take 1 capsule (20 mg total) by mouth daily., Disp: 90 capsule, Rfl: 4   oxybutynin  (DITROPAN  XL) 5 MG 24 hr tablet, Take 1 tablet (5  mg total) by mouth at bedtime., Disp: 90 tablet, Rfl: 3 Social History   Socioeconomic History   Marital status: Widowed    Spouse name: Francis   Number of children: 3   Years of education: 12th Grade   Highest education level: High school graduate  Occupational History   Occupation: Retired  Tobacco Use   Smoking status: Never   Smokeless tobacco: Never  Vaping Use   Vaping status: Never Used  Substance and Sexual Activity   Alcohol use: No   Drug use: No   Sexual activity: Yes  Other Topics Concern   Not on file  Social History Narrative   Retired. 3 grown children. Enjoys sewing and crafting.    Husband passed away around February 01, 2020   She doesn't drive, but her children drive her and get her anything she needs   Social Drivers of  Corporate Investment Banker Strain: Low Risk  (04/10/2023)   Overall Financial Resource Strain (CARDIA)    Difficulty of Paying Living Expenses: Not hard at all  Food Insecurity: No Food Insecurity (04/10/2023)   Hunger Vital Sign    Worried About Running Out of Food in the Last Year: Never true    Ran Out of Food in the Last Year: Never true  Transportation Needs: No Transportation Needs (04/10/2023)   PRAPARE - Administrator, Civil Service (Medical): No    Lack of Transportation (Non-Medical): No  Physical Activity: Insufficiently Active (04/10/2023)   Exercise Vital Sign    Days of Exercise per Week: 7 days    Minutes of Exercise per Session: 20 min  Stress: No Stress Concern Present (04/10/2023)   [employer] of Occupational Health - Occupational Stress Questionnaire    Feeling of Stress : Not at all  Social Connections: Moderately Isolated (04/10/2023)   Social Connection and Isolation Panel    Frequency of Communication with Friends and Family: More than three times a week    Frequency of Social Gatherings with Friends and Family: More than three times a week    Attends Religious Services: More than 4 times per year    Active Member of [employer] or Organizations: No    Attends Banker Meetings: Never    Marital Status: Widowed  Intimate Partner Violence: Not At Risk (04/10/2023)   Humiliation, Afraid, Rape, and Kick questionnaire    Fear of Current or Ex-Partner: No    Emotionally Abused: No    Physically Abused: No    Sexually Abused: No   Family History  Problem Relation Age of Onset   Arthritis Mother    Cancer Mother    Cancer Father    Hearing loss Sister    Stroke Maternal Grandmother    Kidney disease Paternal Grandmother    Thyroid  disease Daughter     Objective: Office vital signs reviewed. BP 137/79   Pulse 80   Temp 98.1 F (36.7 C)   Ht 5' 1 (1.549 m)   Wt 192 lb 2 oz (87.1 kg)   SpO2 92%   BMI 36.30 kg/m    Physical Examination:  General: Awake, alert, morbidly obese, No acute distress HEENT: Sclera white.  Moist mucous membranes.  Oropharynx without any masses or exudates.  TMs intact bilaterally with normal light reflex.  No gross sinus drainage appreciated.  No thyroid  enlargement or nodules Cardio: regular rate and rhythm, S1S2 heard, no murmurs appreciated Pulm: clear to auscultation bilaterally, no wheezes, rhonchi or rales; normal work of  breathing on room air MSK: Ambulating independently with normal gait and station.  No gross joint deformities  Assessment/ Plan: 78 y.o. female   Cough in adult - Plan: Veritor SARS-CoV-2 and Flu A+B, DG Chest 2 View, benzonatate (TESSALON) 200 MG capsule, promethazine-dextromethorphan (PROMETHAZINE-DM) 6.25-15 MG/5ML syrup, CBC with Differential, albuterol  (VENTOLIN  HFA) 108 (90 Base) MCG/ACT inhaler  Mild intermittent asthma without complication - Plan: albuterol  (VENTOLIN  HFA) 108 (90 Base) MCG/ACT inhaler  Snoring - Plan: Ambulatory referral to Sleep Studies  Chronic fatigue - Plan: Ambulatory referral to Sleep Studies, Vitamin B12, Iron, TIBC and Ferritin Panel, TSH + free T4, Basic Metabolic Panel  Leg cramping - Plan: Ambulatory referral to Sleep Studies, Vitamin B12, Iron, TIBC and Ferritin Panel, Basic Metabolic Panel  Pure hypercholesterolemia - Plan: atorvastatin  (LIPITOR) 20 MG tablet  Overactive bladder - Plan: oxybutynin  (DITROPAN  XL) 5 MG 24 hr tablet   Rapid flu and rapid COVID negative.  Plain films obtained given 43-month history of intermittent coughing.  Possibly has uncontrolled asthma and we can consider daily inhaler.  However, pulmonary exam unremarkable today.  Cough medications have been sent.  Albuterol  renewed.  Check CBC.  Nothing on exam to suggest bacterial infection at this time  Referral to sleep studies for chronic fatigue as I suspect she likely has an undiagnosed sleep apnea versus restless leg syndrome.  Will  check for any vitamin abnormalities and thyroid  abnormalities that may be contributing to chronic fatigue  Check potassium level etc. for leg cramping  Did not discuss cholesterol today but needed Lipitor renewed prior to her next visit  Ditropan  working well for overactive bladder but may be contributing to some dizziness spells   Janalee Grobe CHRISTELLA Fielding, DO [facility] [phone]

## 2024-04-22 ENCOUNTER — Ambulatory Visit: Payer: Self-pay | Admitting: Family Medicine

## 2024-04-22 ENCOUNTER — Ambulatory Visit: Admitting: Family Medicine

## 2024-04-22 LAB — CBC WITH DIFFERENTIAL/PLATELET
Basophils Absolute: 0 x10E3/uL (ref 0.0–0.2)
Basos: 1 %
EOS (ABSOLUTE): 0.1 x10E3/uL (ref 0.0–0.4)
Eos: 2 %
Hematocrit: 40.7 % (ref 34.0–46.6)
Hemoglobin: 13.1 g/dL (ref 11.1–15.9)
Immature Grans (Abs): 0 x10E3/uL (ref 0.0–0.1)
Immature Granulocytes: 0 %
Lymphocytes Absolute: 0.9 x10E3/uL (ref 0.7–3.1)
Lymphs: 24 %
MCH: 30.8 pg (ref 26.6–33.0)
MCHC: 32.2 g/dL (ref 31.5–35.7)
MCV: 96 fL (ref 79–97)
Monocytes Absolute: 0.7 x10E3/uL (ref 0.1–0.9)
Monocytes: 19 %
Neutrophils Absolute: 2.1 x10E3/uL (ref 1.4–7.0)
Neutrophils: 54 %
Platelets: 167 x10E3/uL (ref 150–450)
RBC: 4.26 x10E6/uL (ref 3.77–5.28)
RDW: 12.2 % (ref 11.7–15.4)
WBC: 3.8 x10E3/uL (ref 3.4–10.8)

## 2024-04-22 LAB — BASIC METABOLIC PANEL WITH GFR
BUN/Creatinine Ratio: 15 (ref 12–28)
BUN: 13 mg/dL (ref 8–27)
CO2: 26 mmol/L (ref 20–29)
Calcium: 8.7 mg/dL (ref 8.7–10.3)
Chloride: 105 mmol/L (ref 96–106)
Creatinine, Ser: 0.87 mg/dL (ref 0.57–1.00)
Glucose: 104 mg/dL — ABNORMAL HIGH (ref 70–99)
Potassium: 4.2 mmol/L (ref 3.5–5.2)
Sodium: 143 mmol/L (ref 134–144)
eGFR: 69 mL/min/1.73 (ref >59)

## 2024-04-22 LAB — TSH+FREE T4
Free T4: 1.1 ng/dL (ref 0.82–1.77)
TSH: 2.25 u[IU]/mL (ref 0.450–4.500)

## 2024-04-22 LAB — IRON,TIBC AND FERRITIN PANEL
Ferritin: 103 ng/mL (ref 15–150)
Iron Saturation: 12 % — ABNORMAL LOW (ref 15–55)
Iron: 45 ug/dL (ref 27–139)
Total Iron Binding Capacity: 372 ug/dL (ref 250–450)
UIBC: 327 ug/dL (ref 118–369)

## 2024-04-22 LAB — VITAMIN B12: Vitamin B-12: 443 pg/mL (ref 232–1245)

## 2024-04-24 ENCOUNTER — Telehealth: Payer: Self-pay

## 2024-04-24 DIAGNOSIS — Z780 Asymptomatic menopausal state: Secondary | ICD-10-CM | POA: Diagnosis not present

## 2024-04-24 DIAGNOSIS — M85852 Other specified disorders of bone density and structure, left thigh: Secondary | ICD-10-CM | POA: Diagnosis not present

## 2024-04-24 NOTE — Telephone Encounter (Signed)
Patient aware results are not in yet.

## 2024-04-24 NOTE — Telephone Encounter (Signed)
 Copied from CRM [id number]. Topic: Clinical - Lab/Test Results >> Apr 24, 2024  1:52 PM Emylou G wrote: Reason for CRM: Patient called.. checking status of imaging she did this week.SABRA

## 2024-04-28 ENCOUNTER — Ambulatory Visit: Payer: Self-pay | Admitting: Family Medicine

## 2024-04-29 ENCOUNTER — Telehealth: Payer: Self-pay

## 2024-04-29 NOTE — Telephone Encounter (Signed)
 Copied from CRM #[id number]. Topic: Clinical - Lab/Test Results >> Apr 29, 2024  8:48 AM Diannia H wrote: Reason for CRM: Patient was returning a call for [facility], Suzen PARAS, CMA about her labs. Could you assist? Patients callback number is [phone]

## 2024-04-30 ENCOUNTER — Other Ambulatory Visit: Payer: Self-pay

## 2024-04-30 DIAGNOSIS — M8580 Other specified disorders of bone density and structure, unspecified site: Secondary | ICD-10-CM

## 2024-04-30 DIAGNOSIS — Z6830 Body mass index (BMI) 30.0-30.9, adult: Secondary | ICD-10-CM

## 2024-04-30 DIAGNOSIS — R5382 Chronic fatigue, unspecified: Secondary | ICD-10-CM

## 2024-04-30 NOTE — Telephone Encounter (Signed)
 Returned patient call and reviewed bone density results. Patient aware, verbalized understanding. Patient would like to check vitamin D levels to discuss treatment. Lab appointment scheduled and orders put in.

## 2024-05-05 ENCOUNTER — Other Ambulatory Visit

## 2024-05-05 ENCOUNTER — Ambulatory Visit

## 2024-05-05 ENCOUNTER — Other Ambulatory Visit: Payer: Self-pay | Admitting: Family Medicine

## 2024-05-05 DIAGNOSIS — E559 Vitamin D deficiency, unspecified: Secondary | ICD-10-CM

## 2024-05-05 DIAGNOSIS — M8580 Other specified disorders of bone density and structure, unspecified site: Secondary | ICD-10-CM | POA: Diagnosis not present

## 2024-05-05 DIAGNOSIS — M5416 Radiculopathy, lumbar region: Secondary | ICD-10-CM | POA: Diagnosis not present

## 2024-05-05 DIAGNOSIS — Z23 Encounter for immunization: Secondary | ICD-10-CM

## 2024-05-05 DIAGNOSIS — M5412 Radiculopathy, cervical region: Secondary | ICD-10-CM | POA: Diagnosis not present

## 2024-05-06 ENCOUNTER — Ambulatory Visit: Payer: Self-pay | Admitting: Family Medicine

## 2024-05-06 LAB — VITAMIN D 25 HYDROXY (VIT D DEFICIENCY, FRACTURES): Vit D, 25-Hydroxy: 59.6 ng/mL (ref 30.0–100.0)

## 2024-05-07 NOTE — Telephone Encounter (Signed)
 Pt's daughter returned the call requesting to speak to the clinic regarding the differences between fosamax and prolia. Please advise  Best contact: [phone]

## 2024-05-08 ENCOUNTER — Other Ambulatory Visit: Payer: Self-pay | Admitting: Family Medicine

## 2024-05-08 ENCOUNTER — Telehealth: Payer: Self-pay

## 2024-05-08 DIAGNOSIS — M8580 Other specified disorders of bone density and structure, unspecified site: Secondary | ICD-10-CM

## 2024-05-08 MED ORDER — DENOSUMAB 60 MG/ML ~~LOC~~ SOSY: 60.0000 mg | PREFILLED_SYRINGE | SUBCUTANEOUS | Status: DC

## 2024-05-08 MED ORDER — DENOSUMAB 60 MG/ML ~~LOC~~ SOSY: 60.0000 mg | PREFILLED_SYRINGE | SUBCUTANEOUS | Status: AC

## 2024-05-08 NOTE — Progress Notes (Signed)
 VOB sent for Prolia 

## 2024-05-08 NOTE — Telephone Encounter (Signed)
 Prolia  VOB initiated via [email or web address]  Next Prolia  inj DUE: NEW START

## 2024-05-08 NOTE — Progress Notes (Signed)
 Please see if pt can get either prolia or the alternative to prolia.   Meds ordered this encounter  Medications   denosumab (PROLIA) 60 MG/ML SOSY injection    Sig: Inject 60 mg into the skin every 6 (six) months.   Recent Results (from the past 2160 hours)  Veritor SARS-CoV-2 and Flu A+B     Status: None   Collection Time: 04/21/24 11:43 AM   Specimen: Nasal Swab   Nasal Swab  Result Value Ref Range   Influenza A Negative Negative   Influenza B Negative Negative    Comment: If the test is negative for the presence of influenza A or influenza B antigen, infection due to influenza cannot be ruled-out because the antigen present in the sample may be below the detection limit of the test. It is recommended that these results be confirmed by viral culture or an FDA-cleared influenza A and B molecular assay.    BD Veritor SARS-CoV-2 Ag Negative Negative    Comment:              Testing was performed using BD Veritor(TM) SARS-CoV-2 This test has not been FDA cleared or approved. This test has been authorized by FDA under an Emergency Use Authorization (EUA). This test is only authorized for the duration of the declaration that circumstances exist justifying the authorization of emergency use of in vitro diagnostics for detection and/or diagnosis of COVID-19 under Section 564(b)(1) of the Act, 21 U.S.C. 639aaa-6(a)(8), unless the authorization is terminated or revoked sooner. When diagnostic testing is negative, the possibility of a false negative result should be considered in the context of a patient's recent exposure and the presence of clinical signs and symptoms consistent with COVID-19. An individual without symptoms of COVID-19 and who is not shedding SARS-CoV-2 virus would expect to have a negative (not detected) result in this assay.   Vitamin B12     Status: None   Collection Time: 04/21/24 12:33 PM  Result Value Ref Range   Vitamin B-12 443 232 - 1,245 pg/mL  Iron, TIBC  and Ferritin Panel     Status: Abnormal   Collection Time: 04/21/24 12:33 PM  Result Value Ref Range   Total Iron Binding Capacity 372 250 - 450 ug/dL   UIBC 672 881 - 630 ug/dL   Iron 45 27 - 860 ug/dL   Iron Saturation 12 (L) 15 - 55 %   Ferritin 103 15 - 150 ng/mL  TSH + free T4     Status: None   Collection Time: 04/21/24 12:33 PM  Result Value Ref Range   TSH 2.250 0.450 - 4.500 uIU/mL   Free T4 1.10 0.82 - 1.77 ng/dL  CBC with Differential     Status: None   Collection Time: 04/21/24 12:33 PM  Result Value Ref Range   WBC 3.8 3.4 - 10.8 x10E3/uL   RBC 4.26 3.77 - 5.28 x10E6/uL   Hemoglobin 13.1 11.1 - 15.9 g/dL   Hematocrit 59.2 65.9 - 46.6 %   MCV 96 79 - 97 fL   MCH 30.8 26.6 - 33.0 pg   MCHC 32.2 31.5 - 35.7 g/dL   RDW 87.7 88.2 - 84.5 %   Platelets 167 150 - 450 x10E3/uL   Neutrophils 54 Not Estab. %   Lymphs 24 Not Estab. %   Monocytes 19 Not Estab. %   Eos 2 Not Estab. %   Basos 1 Not Estab. %   Neutrophils Absolute 2.1 1.4 - 7.0 x10E3/uL  Lymphocytes Absolute 0.9 0.7 - 3.1 x10E3/uL   Monocytes Absolute 0.7 0.1 - 0.9 x10E3/uL   EOS (ABSOLUTE) 0.1 0.0 - 0.4 x10E3/uL   Basophils Absolute 0.0 0.0 - 0.2 x10E3/uL   Immature Granulocytes 0 Not Estab. %   Immature Grans (Abs) 0.0 0.0 - 0.1 x10E3/uL  Basic Metabolic Panel     Status: Abnormal   Collection Time: 04/21/24 12:33 PM  Result Value Ref Range   Glucose 104 (H) 70 - 99 mg/dL   BUN 13 8 - 27 mg/dL   Creatinine, Ser 9.12 0.57 - 1.00 mg/dL   eGFR 69 >40 fO/fpw/8.26   BUN/Creatinine Ratio 15 12 - 28   Sodium 143 134 - 144 mmol/L   Potassium 4.2 3.5 - 5.2 mmol/L   Chloride 105 96 - 106 mmol/L   CO2 26 20 - 29 mmol/L   Calcium  8.7 8.7 - 10.3 mg/dL  VITAMIN D  25 Hydroxy (Vit-D Deficiency, Fractures)     Status: None   Collection Time: 05/05/24  3:13 PM  Result Value Ref Range   Vit D, 25-Hydroxy 59.6 30.0 - 100.0 ng/mL    Comment: Vitamin D  deficiency has been defined by the Institute of Medicine and an  Endocrine Society practice guideline as a level of serum 25-OH vitamin D  less than 20 ng/mL (1,2). The Endocrine Society went on to further define vitamin D  insufficiency as a level between 21 and 29 ng/mL (2). 1. IOM (Institute of Medicine). 2010. Dietary reference    intakes for calcium  and D. [place]: The    [employer]. 2. Holick MF, Binkley [place], Bischoff-Ferrari HA, et al.    Evaluation, treatment, and prevention of vitamin D     deficiency: an Endocrine Society clinical practice    guideline. JCEM. 2011 Jul; 96(7):1911-30.

## 2024-05-08 NOTE — Addendum Note (Signed)
 Addended by: BRANDY SETTER D on: 05/08/2024 02:35 PM   Modules accepted: Orders

## 2024-05-11 ENCOUNTER — Other Ambulatory Visit (HOSPITAL_COMMUNITY): Payer: Self-pay

## 2024-05-11 NOTE — Telephone Encounter (Signed)
 Please inform pt and see if she prefers the Fosamax instead

## 2024-05-11 NOTE — Telephone Encounter (Signed)
 Pt ready for scheduling for PROLIA  on or after : 05/11/24  Option# 1: Buy/Bill (Office supplied medication)  Out-of-pocket cost due at time of clinic visit: $---  Number of injection/visits approved: ---  Primary:  Prolia  co-insurance: --- Admin fee co-insurance: ---  Secondary: --- Prolia  co-insurance:  Admin fee co-insurance:   Medical Benefit Details: Date Benefits were checked: --- Deductible: ---/ Coinsurance: ---/ Admin Fee: ---  Prior Auth: NOT COVERED PA# Expiration Date:   # of doses approved: ----------------------------------------------------------------------- Option# 2- Med Obtained from pharmacy:  Pharmacy benefit: Copay $881.45 (Paid to pharmacy) Admin Fee: --- (Pay at clinic)  Prior Auth: N/A PA# Expiration Date:   # of doses approved:   If patient wants fill through the pharmacy benefit please send prescription to: [facility], and include estimated need by date in rx notes. Pharmacy will ship medication directly to the office.  Patient NOT eligible for Prolia  Copay Card. Copay Card can make patient's cost as little as $25. Link to apply: [email or web address]://[email or web address]/copay  ** This summary of benefits is an estimation of the patient's out-of-pocket cost. Exact cost may very based on individual plan coverage.

## 2024-05-11 NOTE — Telephone Encounter (Signed)
 Prolia  not covered under medical benefit for osteopenia diagnosis.

## 2024-05-12 NOTE — Telephone Encounter (Signed)
 Patient aware, verbalized understanding. Patient stated that she would like a few days to think about which medication she would like to take and she will call us  at a later date to let us  know.

## 2024-05-15 ENCOUNTER — Telehealth: Payer: Self-pay

## 2024-05-15 NOTE — Telephone Encounter (Signed)
 Patient would like to proceed with Prolia  injections.

## 2024-05-15 NOTE — Telephone Encounter (Signed)
 Copied from CRM #[id number]. Topic: General - Other >> May 15, 2024 11:23 AM Zebedee SAUNDERS wrote: Reason for CRM: Pt returning [facility], Suzen PARAS, CMA call regarding the Prolia  injection, pt has agreed to. Pt would like a call [phone] back from [place].

## 2024-05-18 NOTE — Telephone Encounter (Signed)
 FYI, pt still wants to go with prolia .

## 2024-05-20 NOTE — Addendum Note (Signed)
 Addended by: BRANDY SETTER D on: 05/20/2024 10:22 AM   Modules accepted: Orders

## 2024-05-20 NOTE — Telephone Encounter (Signed)
 Per note form PCP patient would like to start Prolia . Due to cost and that FRAX score is high I asked Mliss to looked at chart to see if PA needed to redone with more information than just osteopenia. Called to let patient know what I am waiting on and patient also has questions regarding side effects. I offered to put in for appointment with Mliss patient agreed - advised her that she will receive a call back to schedule - per patient if no answer leave a message and she will call right back

## 2024-05-26 ENCOUNTER — Telehealth: Payer: Self-pay

## 2024-05-26 ENCOUNTER — Ambulatory Visit

## 2024-05-26 VITALS — BP 135/78 | Wt 189.0 lb

## 2024-05-26 DIAGNOSIS — Z0000 Encounter for general adult medical examination without abnormal findings: Secondary | ICD-10-CM | POA: Diagnosis not present

## 2024-05-26 NOTE — Patient Instructions (Addendum)
 Ms. Kulick,  Thank you for taking the time for your Medicare Wellness Visit. I appreciate your continued commitment to your health goals. Please review the care plan we discussed, and feel free to reach out if I can assist you further.  Please note that Annual Wellness Visits do not include a physical exam. Some assessments may be limited, especially if the visit was conducted virtually. If needed, we may recommend an in-person follow-up with your provider.  Ongoing Care Seeing your primary care provider every 3 to 6 months helps us  monitor your health and provide consistent, personalized care.   Referrals If a referral was made during today's visit and you haven't received any updates within two weeks, please contact the referred provider directly to check on the status.  Recommended Screenings:  Health Maintenance  Topic Date Due   COVID-19 Vaccine (5 - 2025-26 season) 01/20/2024   Breast Cancer Screening  03/23/2025   Medicare Annual Wellness Visit  05/26/2025   Osteoporosis screening with Bone Density Scan  04/24/2026   DTaP/Tdap/Td vaccine (3 - Td or Tdap) 02/09/2029   Pneumococcal Vaccine for age over 6  Completed   Flu Shot  Completed   Hepatitis C Screening  Completed   Zoster (Shingles) Vaccine  Completed   Meningitis B Vaccine  Aged Out   Colon Cancer Screening  Discontinued       04/10/2023   11:01 AM  Advanced Directives  Does Patient Have a Medical Advance Directive? No  Would patient like information on creating a medical advance directive? No - Patient declined   Information on Advanced Care Planning can be found at [place]  Secretary of [facility] Advance Health Care Directives Advance Health Care Directives ([email or web address]   Vision: Annual vision screenings are recommended for early detection of glaucoma, cataracts, and diabetic retinopathy. These exams can also reveal signs of chronic conditions such as diabetes and high blood pressure.  Dental: Annual dental  screenings help detect early signs of oral cancer, gum disease, and other conditions linked to overall health, including heart disease and diabetes.  Please see the attached documents for additional preventive care recommendations.

## 2024-05-26 NOTE — Progress Notes (Signed)
 Care Guide Pharmacy Note  05/26/2024 Name: [name] MRN: [id number] DOB: [birth year]  Referred By: Jolinda Norene HERO, DO Reason for referral: Complex Care Management (Outreach to schedule with Pharm d )   Virginia Nash is a 79 y.o. year old female who is a primary care patient of Jolinda Norene HERO, DO.  Dane C Filyaw was referred to the pharmacist for assistance related to: osteopenia  Successful contact was made with the patient to discuss pharmacy services including being ready for the pharmacist to call at least 5 minutes before the scheduled appointment time and to have medication bottles and any blood pressure readings ready for review. The patient agreed to meet with the pharmacist via telephone visit on (date/time).06/12/2024  Jeoffrey Buffalo , RMA     [place]  [facility] Guide  Direct Dial: [phone]  Website: [email or web address]

## 2024-05-26 NOTE — Progress Notes (Signed)
 "  Chief Complaint  Patient presents with   Medicare Wellness     Subjective:   Virginia Nash is a 79 y.o. female who presents for a Medicare Annual Wellness Visit.  Fall Screening Falls in the past year?: 1 Number of falls in past year: 0 Was there an injury with Fall?: 1 Fall Risk Category Calculator: 2 Patient Fall Risk Level: Moderate Fall Risk  Fall Risk Patient at Risk for Falls Due to: Impaired balance/gait Fall risk Follow up: Falls evaluation completed    Allergies (verified) Asparagus, Codeine, Other, and Peanut oil   Current Medications (verified) Outpatient Encounter Medications as of 05/26/2024  Medication Sig   albuterol  (VENTOLIN  HFA) 108 (90 Base) MCG/ACT inhaler Inhale 2 puffs into the lungs every 4 (four) hours as needed for wheezing or shortness of breath.   atorvastatin  (LIPITOR) 20 MG tablet Take 1 tablet (20 mg total) by mouth daily.   benzonatate  (TESSALON ) 200 MG capsule Take 1 capsule (200 mg total) by mouth 3 (three) times daily as needed for cough.   Calcium  Carbonate-Vit D-Min (CALCIUM  1200 PO) Take by mouth.   Cholecalciferol (VITAMIN D3) 25 MCG (1000 UT) CAPS Take by mouth.   loratadine (CLARITIN) 10 MG tablet Take 10 mg by mouth daily.   meclizine  (ANTIVERT ) 25 MG tablet Take 1 tablet (25 mg total) by mouth 3 (three) times daily as needed for dizziness.   omeprazole  (PRILOSEC) 20 MG capsule Take 1 capsule (20 mg total) by mouth daily.   oxybutynin  (DITROPAN  XL) 5 MG 24 hr tablet Take 1 tablet (5 mg total) by mouth at bedtime.   promethazine -dextromethorphan (PROMETHAZINE -DM) 6.25-15 MG/5ML syrup Take 5 mLs by mouth at bedtime as needed for cough.   Facility-Administered Encounter Medications as of 05/26/2024  Medication   denosumab  (PROLIA ) injection 60 mg    History: Past Medical History:  Diagnosis Date   Allergy    Arthritis    Asthma    as child   Biliary dyskinesia    Blood transfusion without reported diagnosis    Cancer (HCC)     Cataract    Collagen vascular disease    Past Surgical History:  Procedure Laterality Date   ABDOMINAL HYSTERECTOMY     CHOLECYSTECTOMY N/A 04/12/2015   Procedure: LAPAROSCOPIC CHOLECYSTECTOMY;  Surgeon: Lynwood Pina, MD;  Location: [facility];  Service: General;  Laterality: N/A;   COLONOSCOPY N/A 07/31/2017   Procedure: COLONOSCOPY;  Surgeon: Golda Claudis PENNER, MD;  [facility];  Service: Endoscopy;  Laterality: N/A;  830   DILATION AND CURETTAGE OF UTERUS     ESOPHAGEAL DILATION N/A 01/16/2019   Procedure: ESOPHAGEAL DILATION;  Surgeon: Golda Claudis PENNER, MD;  Location: [facility];  Service: Endoscopy;  Laterality: N/A;   ESOPHAGOGASTRODUODENOSCOPY (EGD) WITH PROPOFOL  N/A 01/16/2019   Procedure: ESOPHAGOGASTRODUODENOSCOPY (EGD) WITH PROPOFOL ;  Surgeon: Golda Claudis PENNER, MD;  Location: [facility];  Service: Endoscopy;  Laterality: N/A;  145pm   EYE SURGERY     cataract with lens replacement   TONSILLECTOMY     Family History  Problem Relation Age of Onset   Arthritis Mother    Cancer Mother    Cancer Father    Hearing loss Sister    Stroke Maternal Grandmother    Kidney disease Paternal Grandmother    Thyroid  disease Daughter    Social History   Occupational History   Occupation: Retired  Tobacco Use   Smoking status: Never   Smokeless tobacco: Never  Vaping Use   Vaping status: Never Used  Substance and Sexual Activity   Alcohol use: No   Drug use: No   Sexual activity: Yes   Tobacco Counseling Counseling given: Not Answered  SDOH Screenings   Food Insecurity: No Food Insecurity (04/10/2023)  Housing: Low Risk (04/10/2023)  Transportation Needs: No Transportation Needs (04/10/2023)  Utilities: Not At Risk (04/10/2023)  Alcohol Screen: Low Risk (04/10/2023)  Depression (PHQ2-9): Medium Risk (04/21/2024)  Financial Resource Strain: Low Risk (04/10/2023)  Physical Activity: Insufficiently Active (04/10/2023)  Social Connections:  Moderately Isolated (04/10/2023)  Stress: No Stress Concern Present (04/10/2023)  Tobacco Use: Low Risk (05/26/2024)  Health Literacy: Adequate Health Literacy (04/10/2023)   See flowsheets for full screening details  Depression Screen PHQ 2 & 9 Depression Scale- Over the past 2 weeks, how often have you been bothered by any of the following problems? Little interest or pleasure in doing things: 0 Feeling down, depressed, or hopeless (PHQ Adolescent also includes...irritable): 0 PHQ-2 Total Score: 0 Trouble falling or staying asleep, or sleeping too much: 2 Feeling tired or having little energy: 3 Poor appetite or overeating (PHQ Adolescent also includes...weight loss): 0 Feeling bad about yourself - or that you are a failure or have let yourself or your family down: 0 Trouble concentrating on things, such as reading the newspaper or watching television (PHQ Adolescent also includes...like school work): 0 Moving or speaking so slowly that other people could have noticed. Or the opposite - being so fidgety or restless that you have been moving around a lot more than usual: 1 Thoughts that you would be better off dead, or of hurting yourself in some way: 0 PHQ-9 Total Score: 6 If you checked off any problems, how difficult have these problems made it for you to do your work, take care of things at home, or get along with other people?: Not difficult at all     Goals Addressed             This Visit's Progress    Maintain health and independence   On track            Objective:    Today's Vitals   05/26/24 1319  BP: 135/78  Weight: 189 lb (85.7 kg)   Body mass index is 35.71 kg/m.  Hearing/Vision screen No results found. Immunizations and Health Maintenance Health Maintenance  Topic Date Due   COVID-19 Vaccine (5 - 2025-26 season) 01/20/2024   Mammogram  03/23/2025   Medicare Annual Wellness (AWV)  05/26/2025   Bone Density Scan  04/24/2026   DTaP/Tdap/Td (3 - Td or  Tdap) 02/09/2029   Pneumococcal Vaccine: 50+ Years  Completed   Influenza Vaccine  Completed   Hepatitis C Screening  Completed   Zoster Vaccines- Shingrix  Completed   Meningococcal B Vaccine  Aged Out   Colonoscopy  Discontinued        Assessment/Plan:  This is a routine wellness examination for Kaelyn.  Patient Care Team: Jolinda Norene HERO, DO as PCP - General (Family Medicine) Olegario Messier, MD (Unknown Physician Specialty) Octavia Bruckner, MD as Consulting Physician (Ophthalmology) Eartha Flavors, Toribio, MD as Consulting Physician (Gastroenterology) Shelah Lamar RAMAN, MD as Consulting Physician (Pulmonary Disease) Alvan Dorn FALCON, MD as Consulting Physician (Cardiology) Colerain, [place], [place] (Orthopedic Surgery)  I have personally reviewed and noted the following in the patients chart:   Medical and social history Use of alcohol, tobacco or illicit drugs  Current medications and supplements including opioid prescriptions.  Functional ability and status Nutritional status Physical activity Advanced directives List of other physicians Hospitalizations, surgeries, and ER visits in previous 12 months Vitals Screenings to include cognitive, depression, and falls Referrals and appointments  No orders of the defined types were placed in this encounter.  In addition, I have reviewed and discussed with patient certain preventive protocols, quality metrics, and best practice recommendations. A written personalized care plan for preventive services as well as general preventive health recommendations were provided to patient.   Lavelle Charmaine Browner, LPN   12/22/7971   Return in 1 year (on 05/26/2025).  After Visit Summary: (In Person-Printed) AVS printed and given to the patient  Nurse Notes: HM Addressed: Patient plans to see specialist for management of osteoporosis.   "

## 2024-06-05 ENCOUNTER — Ambulatory Visit

## 2024-06-09 ENCOUNTER — Ambulatory Visit: Admitting: Family Medicine

## 2024-06-09 ENCOUNTER — Encounter: Payer: Self-pay | Admitting: Family Medicine

## 2024-06-09 VITALS — BP 132/88 | Ht 61.0 in | Wt 190.0 lb

## 2024-06-09 DIAGNOSIS — Z8711 Personal history of peptic ulcer disease: Secondary | ICD-10-CM

## 2024-06-09 DIAGNOSIS — M8580 Other specified disorders of bone density and structure, unspecified site: Secondary | ICD-10-CM

## 2024-06-09 DIAGNOSIS — K2100 Gastro-esophageal reflux disease with esophagitis, without bleeding: Secondary | ICD-10-CM | POA: Diagnosis not present

## 2024-06-09 NOTE — Progress Notes (Signed)
 DATE OF VISIT: 06/09/2024        Virginia Nash DOB: 30-Apr-1946 MRN: [id number]  Discussed the use of AI scribe software for clinical note transcription with the patient, who gave verbal consent to proceed.  History of Present Illness Virginia Nash is a 79 year old female with osteopenia and elevated fracture risk who presents for osteoporosis management and medication review. Here with daughter today Referred by PCP   Bone Mineral Density and Fracture Risk: - DEXA scan in December 2025 demonstrated osteopenia - FRAX score calculated to estimate 10-year risk of major osteoporotic and hip fracture.  Considered high risk for fracture based on FRAX score as noted below -- Major osteoporotic fracture: 24.4% -- Hip fracture: 11.1% - No history of hip or spine fractures - Sustained right wrist fracture in October 2024 after a fall, requiring open reduction and internal fixation - No other major fractures  Medication Access and Tolerance: - Seeks more comprehensive information regarding bone health and treatment options - Current medications: calcium  600 mg daily (recently restarted), vitamin D  2000 IU daily, iron supplement for iron deficiency, - No adverse effects from current supplements - Chronic daily nausea leading to reluctance to take additional oral medications  Gastrointestinal Symptoms and History: - History of gastroesophageal reflux disease - Three gastric ulcers identified on prior endoscopy - No history of kidney disease, seizures, or gastric bypass surgery - Increased dietary intake of dairy and leafy greens after previous restriction post-cholecystectomy  Dental Health and Osteonecrosis Risk: - Recent dental work with placement of a permanent bridge for three teeth within the past year - Currently in maintenance phase with no planned dental procedures - Concern regarding rare risk of osteonecrosis of the jaw with osteoporosis medications, especially in context of recent  dental work  Musculoskeletal Symptoms and Physical Activity: - No routine exercise, but performs home-based rehabilitation exercises for shoulder and back six days per week as recommended by prior physical therapy - Does not use weights, yoga, or tai chi - Does not drive, limiting ability to attend external exercise classes  Oncologic and Hormonal History: - History of uterine cancer treated with hysterectomy over ten years ago - Brief hormone replacement therapy at menopause, discontinued after cancer diagnosis  Lifestyle Factors: - Does not smoke - never a smoker - Rare alcohol consumption  Prior treatment: none History of Hip, Spine, or Wrist Fracture: YES - wrist 1 year ago after a fall Heart disease or stroke: no Cancer: YES - uterine cancer Kidney Disease: no Gastric/Peptic Ulcer: YES Gastric bypass surgery: no Severe GERD: YES -history of GERD with esophagitis, currently on PPI History of seizures: no Age at Menopause: early 57's Hysterectomy: YES - due to uterine cancer Calcium  intake: 600mg  daily (recently started) Vitamin D  intake: 2000 international units  daily Hormone replacement therapy: YES - short period Smoking history: never Alcohol: none Exercise: does not do regular exercise Major dental work in past year: YES - recently received permanent bridge Parents with hip/spine fracture: YES - mom with hx of osteoporosis and hip fx - sister with osteoporosis    Medications:  Outpatient Encounter Medications as of 06/09/2024  Medication Sig   albuterol  (VENTOLIN  HFA) 108 (90 Base) MCG/ACT inhaler Inhale 2 puffs into the lungs every 4 (four) hours as needed for wheezing or shortness of breath.   atorvastatin  (LIPITOR) 20 MG tablet Take 1 tablet (20 mg total) by mouth daily.   benzonatate  (TESSALON ) 200 MG capsule Take 1 capsule (200 mg total) by  mouth 3 (three) times daily as needed for cough.   Calcium  Carbonate-Vit D-Min (CALCIUM  1200 PO) Take by mouth.    Cholecalciferol (VITAMIN D3) 25 MCG (1000 UT) CAPS Take by mouth.   loratadine (CLARITIN) 10 MG tablet Take 10 mg by mouth daily.   meclizine  (ANTIVERT ) 25 MG tablet Take 1 tablet (25 mg total) by mouth 3 (three) times daily as needed for dizziness. (Patient not taking: Reported on 05/26/2024)   omeprazole  (PRILOSEC) 20 MG capsule Take 1 capsule (20 mg total) by mouth daily.   oxybutynin  (DITROPAN  XL) 5 MG 24 hr tablet Take 1 tablet (5 mg total) by mouth at bedtime.   promethazine -dextromethorphan (PROMETHAZINE -DM) 6.25-15 MG/5ML syrup Take 5 mLs by mouth at bedtime as needed for cough.   Facility-Administered Encounter Medications as of 06/09/2024  Medication   denosumab  (PROLIA ) injection 60 mg    Allergies: is allergic to asparagus, codeine, other, and peanut oil.  Physical Examination: Vitals: BP 132/88   Ht 5' 1 (1.549 m)   Wt 190 lb (86.2 kg)   BMI 35.90 kg/m  GENERAL:  Virginia Nash is a 79 y.o. female appearing their stated age, alert and oriented x 3, in no apparent distress.  MSK: seated comfortably on exam room table, normal gross upper extremity and lower extremity strength PSYCH: appropriate mood, good insight, engaging  Radiology: DEXA SCAN 04/21/24 showing: FINDINGS: Scan quality: Good.   LUMBAR SPINE (L1-L2):   BMD (in g/cm2): 1.175   T-score: 0.0   Z-score: 1.0   Rate of change from previous exam: No significant rate of change from previous exam.   LEFT FEMORAL NECK:   BMD (in g/cm2): 0.851   T-score: -1.3   Z-score: 0.2   LEFT TOTAL HIP:   BMD (in g/cm2): 0.948   T-score: -0.5   Z-score: 0.8   RIGHT FEMORAL NECK:   BMD (in g/cm2): 0.909   T-score: -0.9   Z-score: 0.6   RIGHT TOTAL HIP:   BMD (in g/cm2): 1.002   T-score: 0.0   Z-score: 1.2   DUAL-FEMUR TOTAL MEAN:   Rate of change from previous exam: -3.7 %   LEFT FOREARM (RADIUS 33%):   BMD (in g/cm2): 0.871   T-score: -0.2   Z-score: 2.2   Rate of change from  previous exam: No significant rate of change from previous exam.   FRAX 10-YEAR PROBABILITY OF FRACTURE:   10-year fracture risk is performed using the [facility] calculator based on patient-reported risk factors.   Major osteoporotic fracture: 24.4% Hip fracture: 11.1%   Other situations known to alter the reliability of the FRAX score should be considered when making treatment decisions, including chronic glucocorticoid use and past treatments. Further guidance on treatment can be found at the [facility]oporosis Foundation's website [email or web address]   IMPRESSION: Osteopenia based on BMD.  LABS: Personally reviewed and interpreted the following labs -Vitamin D  59.6 on 05/05/2024 and normal - BMP 04/21/24 which was unremarkable and normal, calcium  level 8.7 - CBC 04/21/24 which was normal -TSH and free T4 04/21/24 which were normal, 2.250, 1.10 respectively   Fracture risk is increased. Increased risk is based on low BMD and FRAX calculation.  Assessment & Plan Osteopenia with high risk of fracture Osteopenia with elevated fracture risk based on DEXA and FRAX scores. Oral bisphosphonates contraindicated due to history of GERD and gastric ulcers.  Injectable antiresorptive therapy preferred to help improve overall bone health and prevent further decline of bone density.  - Office  visit notes with PCP reviewed during the visit today - Labs reviewed as noted above and personally interpreted by me today during the visit - DEXA scan reviewed with patient and daughter in detail during the visit today.  Advised that even though this shows she has osteopenia with T-score of -1.3, she has high risk of fracture based on her FRAX scores.  Due to her high fracture risk, she is candidate for injectable antiresorptive therapy.  She has contraindications to oral bisphosphonates and hormonal therapies such as Evista as noted above. - Given her increased fracture risk, she would  be candidate for injectable antiresorptive therapy (Prolia /Jubanti).  Explained medication and potential risks.  She did have recent significant dental surgery, reviewed risks of jaw osteonecrosis and advised discussion with her dentist to inquire if Prolia  therapy would be okay.  She is concerned about potential jaw/tooth complications, but will discuss further with her dentist and update us  regarding her conversation. - Continue vitamin D  2000 IU daily. - Increase calcium  to 1200 mg daily and divided doses throughout the day. - Educated on weight-bearing/resistance exercise; encouraged walking, light weights, yoga, tai chi.  She has already planned to start becoming more active -If dentist and patient are in agreement with antiresorptive therapy with Prolia , will submit for prior authorization.  If patient and/or dentist do not want to proceed with antiresorptive therapy, best option would be to continue calcium , vitamin D , initiate weightbearing exercise exercise, and repeat DEXA in 2 years (2027). -Tried to assist with MyChart access for follow-up during the visit today.  Password reset messages sent to telephone number through text and email that are on file.  History of GERD with esophagitis and gastric ulcer - Should continue her PPI therapy - Due to history of GERD and gastric ulcer she is not a candidate for oral bisphosphonate therapy  History of uterine cancer status post hysterectomy - Due to her history of cancer, she is not a candidate for future treatments with Forteo or Tymlos  Patient and daughter expressed understanding & agreement with above.  I personally spent a total of 60 minutes in the care of the patient today including preparing to see the patient, getting/reviewing separately obtained history, performing a medically appropriate exam/evaluation, counseling and educating, documenting clinical information in the EHR, independently interpreting results, communicating results,  and coordinating care.   Encounter Diagnoses  Name Primary?   Osteopenia with high risk of fracture Yes   Gastroesophageal reflux disease with esophagitis, unspecified whether hemorrhage    Personal history of gastric ulcer     No orders of the defined types were placed in this encounter.    Contains text generated by [employer]bridge.

## 2024-06-09 NOTE — Patient Instructions (Signed)
 Osteoporosis Work-up and Treatment  Labwork: Your vitamin D , calcium , blood counts, and thyroid  levels were normal   Supplementation: Make sure you're getting 1200mg  calcium  and 800 IU of vitamin D  daily  Exercise: Strength/resistance-based exercise (i.e. free weights, lifting); weight-based (yoga, tai-chi, pilates)  Medication Options: Prolia  - injection that just maintains bone - given every 6 months here in the office.  You should check with your dentist if they are ok with you using this medication for your bone health.  There is very, very small risk of jaw issues called osteonecrosis of the jaw.    You can call me to update me about your discussion with the dentist.   - if we do not proceed with Prolia , I recommend that you continue Calcium  and Vitamin D , along with exercise as noted above.  Please let me know if you have any other questions.  Regards, Rainell Cedar, DO

## 2024-06-12 ENCOUNTER — Other Ambulatory Visit

## 2024-11-23 ENCOUNTER — Encounter: Admitting: Family Medicine
